# Patient Record
Sex: Female | Born: 1950 | Race: White | Hispanic: No | Smoking: Never smoker
Health system: Southern US, Community
[De-identification: ages and names within clinical notes are randomized; demographics above are authoritative.]

## PROBLEM LIST (undated history)

## (undated) DIAGNOSIS — M1990 Unspecified osteoarthritis, unspecified site: Secondary | ICD-10-CM

## (undated) DIAGNOSIS — G4700 Insomnia, unspecified: Secondary | ICD-10-CM

## (undated) DIAGNOSIS — F419 Anxiety disorder, unspecified: Secondary | ICD-10-CM

## (undated) DIAGNOSIS — E785 Hyperlipidemia, unspecified: Secondary | ICD-10-CM

## (undated) DIAGNOSIS — E039 Hypothyroidism, unspecified: Secondary | ICD-10-CM

## (undated) DIAGNOSIS — I10 Essential (primary) hypertension: Secondary | ICD-10-CM

## (undated) DIAGNOSIS — D126 Benign neoplasm of colon, unspecified: Secondary | ICD-10-CM

## (undated) DIAGNOSIS — D3A019 Benign carcinoid tumor of the small intestine, unspecified portion: Secondary | ICD-10-CM

## (undated) HISTORY — DX: Essential (primary) hypertension: I10

## (undated) HISTORY — DX: Benign carcinoid tumor of the small intestine, unspecified portion: D3A.019

## (undated) HISTORY — PX: TONSILLECTOMY: SUR1361

## (undated) HISTORY — PX: APPENDECTOMY: SHX54

## (undated) HISTORY — PX: OTHER SURGICAL HISTORY: SHX169

## (undated) HISTORY — PX: ABDOMINAL HYSTERECTOMY: SHX81

## (undated) HISTORY — PX: COLON SURGERY: SHX602

## (undated) HISTORY — DX: Benign neoplasm of colon, unspecified: D12.6

## (undated) HISTORY — DX: Unspecified osteoarthritis, unspecified site: M19.90

## (undated) HISTORY — DX: Hyperlipidemia, unspecified: E78.5

## (undated) HISTORY — DX: Insomnia, unspecified: G47.00

## (undated) HISTORY — DX: Hypothyroidism, unspecified: E03.9

---

## 2002-07-19 ENCOUNTER — Encounter: Payer: Self-pay | Admitting: Internal Medicine

## 2002-07-19 ENCOUNTER — Ambulatory Visit (HOSPITAL_COMMUNITY): Admission: RE | Admit: 2002-07-19 | Discharge: 2002-07-19 | Payer: Self-pay | Admitting: Internal Medicine

## 2002-12-11 ENCOUNTER — Ambulatory Visit (HOSPITAL_COMMUNITY): Admission: RE | Admit: 2002-12-11 | Discharge: 2002-12-11 | Payer: Self-pay | Admitting: Rheumatology

## 2002-12-11 ENCOUNTER — Encounter: Payer: Self-pay | Admitting: Rheumatology

## 2003-08-14 ENCOUNTER — Ambulatory Visit (HOSPITAL_COMMUNITY): Admission: RE | Admit: 2003-08-14 | Discharge: 2003-08-14 | Payer: Self-pay | Admitting: Internal Medicine

## 2004-08-21 ENCOUNTER — Ambulatory Visit (HOSPITAL_COMMUNITY): Admission: RE | Admit: 2004-08-21 | Discharge: 2004-08-21 | Payer: Self-pay | Admitting: Internal Medicine

## 2005-06-11 ENCOUNTER — Ambulatory Visit (HOSPITAL_COMMUNITY): Admission: RE | Admit: 2005-06-11 | Discharge: 2005-06-11 | Payer: Self-pay | Admitting: Internal Medicine

## 2005-08-23 HISTORY — PX: COLONOSCOPY: SHX174

## 2005-08-27 ENCOUNTER — Ambulatory Visit (HOSPITAL_COMMUNITY): Admission: RE | Admit: 2005-08-27 | Discharge: 2005-08-27 | Payer: Self-pay | Admitting: Internal Medicine

## 2005-09-18 ENCOUNTER — Encounter (INDEPENDENT_AMBULATORY_CARE_PROVIDER_SITE_OTHER): Payer: Self-pay | Admitting: Specialist

## 2005-09-18 ENCOUNTER — Ambulatory Visit: Payer: Self-pay | Admitting: Internal Medicine

## 2005-09-18 ENCOUNTER — Ambulatory Visit (HOSPITAL_COMMUNITY): Admission: RE | Admit: 2005-09-18 | Discharge: 2005-09-18 | Payer: Self-pay | Admitting: Internal Medicine

## 2006-08-31 ENCOUNTER — Ambulatory Visit (HOSPITAL_COMMUNITY): Admission: RE | Admit: 2006-08-31 | Discharge: 2006-08-31 | Payer: Self-pay | Admitting: Internal Medicine

## 2006-09-03 ENCOUNTER — Ambulatory Visit (HOSPITAL_COMMUNITY): Admission: RE | Admit: 2006-09-03 | Discharge: 2006-09-03 | Payer: Self-pay | Admitting: Internal Medicine

## 2006-09-08 ENCOUNTER — Encounter: Admission: RE | Admit: 2006-09-08 | Discharge: 2006-09-08 | Payer: Self-pay | Admitting: Internal Medicine

## 2007-03-16 ENCOUNTER — Ambulatory Visit (HOSPITAL_COMMUNITY): Admission: RE | Admit: 2007-03-16 | Discharge: 2007-03-16 | Payer: Self-pay | Admitting: Internal Medicine

## 2008-11-08 ENCOUNTER — Ambulatory Visit (HOSPITAL_COMMUNITY): Admission: RE | Admit: 2008-11-08 | Discharge: 2008-11-08 | Payer: Self-pay | Admitting: Internal Medicine

## 2010-06-14 ENCOUNTER — Encounter: Payer: Self-pay | Admitting: Internal Medicine

## 2010-09-16 ENCOUNTER — Encounter: Payer: Self-pay | Admitting: Internal Medicine

## 2010-09-22 ENCOUNTER — Ambulatory Visit (INDEPENDENT_AMBULATORY_CARE_PROVIDER_SITE_OTHER): Payer: Managed Care, Other (non HMO) | Admitting: Gastroenterology

## 2010-09-22 ENCOUNTER — Encounter: Payer: Self-pay | Admitting: Gastroenterology

## 2010-09-22 VITALS — BP 126/82 | HR 67 | Temp 97.7°F | Ht 61.0 in | Wt 175.8 lb

## 2010-09-22 DIAGNOSIS — Z86010 Personal history of colonic polyps: Secondary | ICD-10-CM

## 2010-09-22 MED ORDER — PEG 3350-KCL-NA BICARB-NACL 420 G PO SOLR: ORAL | Status: AC

## 2010-09-22 NOTE — Progress Notes (Signed)
Primary Care Physician:  Carylon Perches, MD  Primary Gastroenterologist:  Dr. Roetta Sessions  Chief Complaint  Patient presents with  . Colon Cancer Screening    h/o adenomatous polyps    HPI:  Hailey Randolph is a 60 y.o. female here for history of adenomatous colon polyps. She is due for surveillance colonoscopy. Last colonoscopy April 2007, scattered left-sided diverticula, pedunculated polyp at 30 cm which was adenomatous. She denies any constipation, diarrhea, melena, rectal bleeding, abdominal pain, unintentional weight loss, nausea, vomiting, heartburn, dysphagia.  Recent labs, CBC, met 7, LFTs all normal. TSH 12, Synthroid dose has been adjusted.  Current Outpatient Prescriptions  Medication Sig Dispense Refill  . aspirin 81 MG tablet Take 81 mg by mouth daily.        Marland Kitchen atorvastatin (LIPITOR) 20 MG tablet Take 20 mg by mouth daily.        . fish oil-omega-3 fatty acids 1000 MG capsule Take 2 g by mouth daily.        Marland Kitchen levothyroxine (SYNTHROID, LEVOTHROID) 75 MCG tablet Take 75 mcg by mouth daily.        Marland Kitchen lisinopril (PRINIVIL,ZESTRIL) 10 MG tablet Take 10 mg by mouth daily.        . polyethylene glycol-electrolytes (TRILYTE) 420 G solution Use as directed Also buy 1 fleet enema & 4 dulcolax tablets to use as directed  4000 mL  0    Allergies as of 09/22/2010  . (No Known Allergies)    Past Medical History  Diagnosis Date  . HTN (hypertension)   . Hyperlipidemia   . Hypothyroidism   . Adenomatous colon polyp     Past Surgical History  Procedure Date  . Tonsillectomy   . Hysterectomy     complete  . Colonoscopy April 2007    Adenomatous polyp, pedunculated, at 30 cm. Scattered left-sided diverticula     Family History  Problem Relation Age of Onset  . Colon polyps Mother   . Breast cancer Mother 63  . Heart attack Father 47    deceased  . Liver disease Neg Hx     History   Social History  . Marital Status: Widowed    Spouse Name: N/A    Number of Children:  2  . Years of Education: N/A   Occupational History  . [employer]   Social History Main Topics  . Smoking status: Never Smoker   . Smokeless tobacco: Not on file  . Alcohol Use: No  . Drug Use: No  . Sexually Active: Not on file      ROS:  General: Negative for anorexia, weight loss, fever, chills, fatigue, weakness. Eyes: Negative for vision changes.  ENT: Negative for hoarseness, difficulty swallowing , nasal congestion. CV: Negative for chest pain, angina, palpitations, dyspnea on exertion, peripheral edema.  Respiratory: Negative for dyspnea at rest, dyspnea on exertion, cough, sputum, wheezing.  GI: See history of present illness. GU:  Negative for dysuria, hematuria, urinary incontinence, urinary frequency, nocturnal urination.  MS: Negative for joint pain, low back pain.  Derm: Negative for rash or itching.  Neuro: Negative for weakness, abnormal sensation, seizure, frequent headaches, memory loss, confusion.  Psych: Negative for anxiety, depression, suicidal ideation, hallucinations.  Endo: Negative for unusual weight change.  Heme: Negative for bruising or bleeding. Allergy: Negative for rash or hives.    Physical Examination:  BP 126/82  Pulse 67  Temp(Src) 97.7 F (36.5 C) (Tympanic)  Ht 5\' 1"  (1.549  m)  Wt 175 lb 12.8 oz (79.742 kg)  BMI 33.22 kg/m2   General: Well-nourished, well-developed in no acute distress.  Head: Normocephalic, atraumatic.   Eyes: Conjunctiva pink, no icterus. Mouth: Oropharyngeal mucosa moist and pink , no lesions erythema or exudate. Neck: Supple without thyromegaly, masses, or lymphadenopathy.  Lungs: Clear to auscultation bilaterally.  Heart: Regular rate and rhythm, no murmurs rubs or gallops.  Abdomen: Bowel sounds are normal, nontender, nondistended, no hepatosplenomegaly or masses, no abdominal bruits or    hernia , no rebound or guarding.   Extremities: No lower extremity edema.  Neuro: Alert and  oriented x 4 , grossly normal neurologically.  Skin: Warm and dry, no rash or jaundice.   Psych: Alert and cooperative, normal mood and affect.

## 2010-09-22 NOTE — Assessment & Plan Note (Signed)
Due for 5 year surveillance colonoscopy at this time. I have discussed the risks, alternatives, benefits with regards to but not limited to the risk of reaction to medication, bleeding, infection, perforation and the patient is agreeable to proceed. Written consent to be obtained.

## 2010-09-22 NOTE — Progress Notes (Signed)
Cc to PCP 

## 2010-09-29 ENCOUNTER — Ambulatory Visit: Payer: Self-pay | Admitting: Gastroenterology

## 2010-10-10 NOTE — Op Note (Signed)
[name], Hailey Randolph               ACCOUNT NO.:  [id number]   MEDICAL RECORD NO.:  [id number]          PATIENT TYPE:  AMB   LOCATION:  DAY                           FACILITY:  [facility]   PHYSICIAN:  R. Roetta Sessions, M.D. DATE OF BIRTH:  [birth year]   DATE OF PROCEDURE:  09/18/2005  DATE OF DISCHARGE:                                 OPERATIVE REPORT   PROCEDURE:  Colonoscopy, snare polypectomy.   INDICATIONS FOR PROCEDURE:  Patient is a 60 year old lady who has lower GI  tract symptoms, who comes for screening colonoscopy.  There is no family  history of colorectal neoplasia.  She has never had lower GI tract  __________.  This approach has been discussed with the patient at length.  Potential risks, benefits and alternatives have been reviewed and questions  answered.  She is agreeable.  Please see documentation on the medical  record.   PROCEDURE NOTE:  O2 saturation, blood pressure, pulses, and respirations  were monitored throughout the entire procedure.  Conscious sedation with  Versed 4 mg IV, Demerol 75 mg IV in divided doses.   INSTRUMENT:  Olympus video chip system.   FINDINGS:  Digital rectal exam revealed no abnormalities.   ENDOSCOPIC FINDINGS:  Prep was adequate.   RECTAL:  Examination of the rectal mucosa, including retroflexion of the  anal verge, revealed no abnormalities.   COLON:  The colonic mucosa was surveyed from the rectosigmoid junction to  the left transverse, right colon, to the area of the appendiceal orifice,  the ileocecal valve, and cecum.  These structures were well seen and  photographed for the record.  From this level, the scope was slowly  withdrawn.  All previously mentioned mucosal surfaces were again seen.  The  patient was noted to have sigmoid diverticula and a 5 mm pedunculated polyp  at 30 cm in from the anal verge.  The remainder of the colonic mucosa  appeared normal.  The polyp was removed with hot snare and was recovered  with the  scope.  The patient tolerated the procedure well and was reactive.   ENDOSCOPY IMPRESSION:  Normal rectum, scattered left-sided diverticula,  pedunculated polyp at 30 cm, resected, as described above.  The remaining  colonic mucosa appeared normal.   RECOMMENDATIONS:  1.  No aspirin or nonsteroidal medications for 10 days.  2.  Follow up on path.  3.  Further recommendations.  4.  Diverticulosis literature provided to Ms. Dingus.      Jonathon Bellows, M.D.  Electronically Signed     RMR/MEDQ  D:  09/18/2005  T:  09/18/2005  Job:  [id number]   cc:   Hailey Callander. Ouida Sills, MD  Fax: [phone]

## 2010-10-27 ENCOUNTER — Encounter: Payer: Managed Care, Other (non HMO) | Admitting: Internal Medicine

## 2010-10-27 ENCOUNTER — Ambulatory Visit (HOSPITAL_COMMUNITY)
Admission: RE | Admit: 2010-10-27 | Discharge: 2010-10-27 | Disposition: A | Discharge status: Home / Self Care | Payer: Managed Care, Other (non HMO) | Source: Ambulatory Visit | Attending: Internal Medicine | Admitting: Internal Medicine

## 2010-10-27 DIAGNOSIS — K5730 Diverticulosis of large intestine without perforation or abscess without bleeding: Secondary | ICD-10-CM | POA: Insufficient documentation

## 2010-10-27 DIAGNOSIS — Z860100 Personal history of colon polyps, unspecified: Secondary | ICD-10-CM | POA: Insufficient documentation

## 2010-10-27 DIAGNOSIS — Z7982 Long term (current) use of aspirin: Secondary | ICD-10-CM | POA: Insufficient documentation

## 2010-10-27 DIAGNOSIS — Z09 Encounter for follow-up examination after completed treatment for conditions other than malignant neoplasm: Secondary | ICD-10-CM | POA: Insufficient documentation

## 2010-10-27 DIAGNOSIS — I10 Essential (primary) hypertension: Secondary | ICD-10-CM | POA: Insufficient documentation

## 2010-10-27 DIAGNOSIS — Z1211 Encounter for screening for malignant neoplasm of colon: Secondary | ICD-10-CM

## 2010-10-27 DIAGNOSIS — Z79899 Other long term (current) drug therapy: Secondary | ICD-10-CM | POA: Insufficient documentation

## 2010-10-27 DIAGNOSIS — E785 Hyperlipidemia, unspecified: Secondary | ICD-10-CM | POA: Insufficient documentation

## 2010-12-08 NOTE — Op Note (Signed)
  Hailey Randolph, Hailey Randolph               ACCOUNT NO.:  [id number]  MEDICAL RECORD NO.:  [id number]  LOCATION:  DAYP                          FACILITY:  [facility]  PHYSICIAN:  R. Roetta Sessions, MD FACP FACGDATE OF BIRTH:  Jan 19, 1951  DATE OF PROCEDURE:  10/27/2010 DATE OF DISCHARGE:                              OPERATIVE REPORT   INDICATIONS FOR PROCEDURE:  A 60 year old lady with history of colonic adenomas, last colonoscopy 2007 at which time she had a potential adenoma in her left colon removed and she had some left-sided diverticula.  She is currently devoid of any lower GI tract symptoms. Colonoscopy is now being done as a surveillance maneuver.  Risks, benefits, limitations, alternatives, and imponderables have been reviewed today and previously, questions answered, all parties agreeable.  PROCEDURE NOTE:  O2 saturation, blood pressure, pulse, respirations monitored throughout the entirety of the procedure.  CONSCIOUS SEDATION:  Versed 6 mg IV and Demerol 100 mg IV in divided doses.  INSTRUMENT:  Pentax video chip system.  FINDINGS:  Digital rectal exam revealed no abnormalities.  Endoscopic findings:  Prep was good.  Colon:  Colonic mucosa was surveyed from the rectosigmoid junction through the left transverse right colon to the appendiceal orifice, ileocecal valve/cecum.  These structures were well seen and photographed for the record.  From this level scope was slowly and cautiously withdrawn.  All previous mentioned mucosal surfaces were again seen.  The patient was noted to have sigmoid diverticula.  The colonic mucosa appeared normal.  Scope was pulled down into the rectum where thorough examination of rectal mucosa including retroflex view of anal verge demonstrated no abnormalities.  The patient tolerated the procedure well.  Cecal withdrawal time 8 minutes.  IMPRESSION: 1. Normal rectum. 2. Sigmoid diverticulum and colonic mucosa appeared normal.  RECOMMENDATIONS: 1.  Diverticulosis literature provided to Ms. Trainer. 2. We will repeat colonoscopy in 5 years.     Jonathon Bellows, MD FACP [facility]     RMR/MEDQ  D:  10/27/2010  T:  10/27/2010  Job:  [id number]  cc:   Kingsley Callander. Ouida Sills, MD Fax: [phone]  Electronically Signed by Lorrin Goodell M.D. on 12/08/2010 08:36:47 AM

## 2011-03-04 ENCOUNTER — Other Ambulatory Visit (HOSPITAL_COMMUNITY): Payer: Self-pay | Admitting: Internal Medicine

## 2011-03-04 DIAGNOSIS — Z139 Encounter for screening, unspecified: Secondary | ICD-10-CM

## 2011-03-09 ENCOUNTER — Ambulatory Visit (HOSPITAL_COMMUNITY)
Admission: RE | Admit: 2011-03-09 | Discharge: 2011-03-09 | Disposition: A | Discharge status: Home / Self Care | Payer: Managed Care, Other (non HMO) | Source: Ambulatory Visit | Attending: Internal Medicine | Admitting: Internal Medicine

## 2011-03-09 DIAGNOSIS — Z1231 Encounter for screening mammogram for malignant neoplasm of breast: Secondary | ICD-10-CM | POA: Insufficient documentation

## 2011-03-09 DIAGNOSIS — Z139 Encounter for screening, unspecified: Secondary | ICD-10-CM

## 2012-02-26 ENCOUNTER — Other Ambulatory Visit (HOSPITAL_COMMUNITY): Payer: Self-pay | Admitting: Internal Medicine

## 2012-02-26 DIAGNOSIS — Z139 Encounter for screening, unspecified: Secondary | ICD-10-CM

## 2012-03-10 ENCOUNTER — Ambulatory Visit (HOSPITAL_COMMUNITY)
Admission: RE | Admit: 2012-03-10 | Discharge: 2012-03-10 | Disposition: A | Discharge status: Home / Self Care | Payer: Managed Care, Other (non HMO) | Source: Ambulatory Visit | Attending: Internal Medicine | Admitting: Internal Medicine

## 2012-03-10 DIAGNOSIS — Z139 Encounter for screening, unspecified: Secondary | ICD-10-CM

## 2012-03-10 DIAGNOSIS — Z1231 Encounter for screening mammogram for malignant neoplasm of breast: Secondary | ICD-10-CM | POA: Insufficient documentation

## 2012-03-16 ENCOUNTER — Other Ambulatory Visit: Payer: Self-pay | Admitting: Internal Medicine

## 2012-03-16 DIAGNOSIS — R928 Other abnormal and inconclusive findings on diagnostic imaging of breast: Secondary | ICD-10-CM

## 2012-03-30 ENCOUNTER — Ambulatory Visit (HOSPITAL_COMMUNITY)
Admission: RE | Admit: 2012-03-30 | Discharge: 2012-03-30 | Disposition: A | Discharge status: Home / Self Care | Payer: Managed Care, Other (non HMO) | Source: Ambulatory Visit | Attending: Internal Medicine | Admitting: Internal Medicine

## 2012-03-30 ENCOUNTER — Other Ambulatory Visit (HOSPITAL_COMMUNITY): Payer: Self-pay | Admitting: Internal Medicine

## 2012-03-30 DIAGNOSIS — R928 Other abnormal and inconclusive findings on diagnostic imaging of breast: Secondary | ICD-10-CM

## 2013-02-13 ENCOUNTER — Other Ambulatory Visit (HOSPITAL_COMMUNITY): Payer: Self-pay | Admitting: Internal Medicine

## 2013-02-13 DIAGNOSIS — Z09 Encounter for follow-up examination after completed treatment for conditions other than malignant neoplasm: Secondary | ICD-10-CM

## 2013-04-05 ENCOUNTER — Ambulatory Visit (HOSPITAL_COMMUNITY)
Admission: RE | Admit: 2013-04-05 | Discharge: 2013-04-05 | Disposition: A | Discharge status: Home / Self Care | Payer: Managed Care, Other (non HMO) | Source: Ambulatory Visit | Attending: Internal Medicine | Admitting: Internal Medicine

## 2013-04-05 DIAGNOSIS — N63 Unspecified lump in unspecified breast: Secondary | ICD-10-CM | POA: Insufficient documentation

## 2013-04-05 DIAGNOSIS — Z09 Encounter for follow-up examination after completed treatment for conditions other than malignant neoplasm: Secondary | ICD-10-CM

## 2013-08-29 ENCOUNTER — Other Ambulatory Visit (HOSPITAL_COMMUNITY): Payer: Self-pay | Admitting: Internal Medicine

## 2013-08-29 DIAGNOSIS — R928 Other abnormal and inconclusive findings on diagnostic imaging of breast: Secondary | ICD-10-CM

## 2013-10-04 ENCOUNTER — Ambulatory Visit (HOSPITAL_COMMUNITY)
Admission: RE | Admit: 2013-10-04 | Discharge: 2013-10-04 | Disposition: A | Discharge status: Home / Self Care | Payer: Managed Care, Other (non HMO) | Source: Ambulatory Visit | Attending: Internal Medicine | Admitting: Internal Medicine

## 2013-10-04 ENCOUNTER — Encounter (HOSPITAL_COMMUNITY): Payer: Managed Care, Other (non HMO)

## 2013-10-04 DIAGNOSIS — R928 Other abnormal and inconclusive findings on diagnostic imaging of breast: Secondary | ICD-10-CM | POA: Insufficient documentation

## 2014-03-07 ENCOUNTER — Other Ambulatory Visit (HOSPITAL_COMMUNITY): Payer: Self-pay | Admitting: Internal Medicine

## 2014-03-07 DIAGNOSIS — N63 Unspecified lump in unspecified breast: Secondary | ICD-10-CM

## 2014-04-05 ENCOUNTER — Other Ambulatory Visit (HOSPITAL_COMMUNITY): Payer: Self-pay | Admitting: Nurse Practitioner

## 2014-04-05 DIAGNOSIS — N183 Chronic kidney disease, stage 3 unspecified: Secondary | ICD-10-CM

## 2014-04-05 DIAGNOSIS — I10 Essential (primary) hypertension: Secondary | ICD-10-CM

## 2014-04-10 ENCOUNTER — Ambulatory Visit (HOSPITAL_COMMUNITY)
Admission: RE | Admit: 2014-04-10 | Discharge: 2014-04-10 | Disposition: A | Discharge status: Home / Self Care | Payer: Managed Care, Other (non HMO) | Source: Ambulatory Visit | Attending: Nurse Practitioner | Admitting: Nurse Practitioner

## 2014-04-10 ENCOUNTER — Ambulatory Visit (HOSPITAL_COMMUNITY)
Admission: RE | Admit: 2014-04-10 | Discharge: 2014-04-10 | Disposition: A | Discharge status: Home / Self Care | Payer: Managed Care, Other (non HMO) | Source: Ambulatory Visit | Attending: Internal Medicine | Admitting: Internal Medicine

## 2014-04-10 DIAGNOSIS — I10 Essential (primary) hypertension: Secondary | ICD-10-CM

## 2014-04-10 DIAGNOSIS — N63 Unspecified lump in unspecified breast: Secondary | ICD-10-CM

## 2014-04-10 DIAGNOSIS — N183 Chronic kidney disease, stage 3 unspecified: Secondary | ICD-10-CM

## 2015-03-04 ENCOUNTER — Other Ambulatory Visit (HOSPITAL_COMMUNITY): Payer: Self-pay | Admitting: Internal Medicine

## 2015-03-04 DIAGNOSIS — Z1231 Encounter for screening mammogram for malignant neoplasm of breast: Secondary | ICD-10-CM

## 2015-04-12 ENCOUNTER — Ambulatory Visit (HOSPITAL_COMMUNITY): Payer: Managed Care, Other (non HMO)

## 2015-04-15 ENCOUNTER — Other Ambulatory Visit (HOSPITAL_COMMUNITY): Payer: Self-pay | Admitting: Internal Medicine

## 2015-04-15 ENCOUNTER — Ambulatory Visit (HOSPITAL_COMMUNITY): Payer: Managed Care, Other (non HMO)

## 2015-04-15 ENCOUNTER — Ambulatory Visit (HOSPITAL_COMMUNITY)
Admission: RE | Admit: 2015-04-15 | Discharge: 2015-04-15 | Disposition: A | Discharge status: Home / Self Care | Payer: Managed Care, Other (non HMO) | Source: Ambulatory Visit | Attending: Internal Medicine | Admitting: Internal Medicine

## 2015-04-15 DIAGNOSIS — Z1231 Encounter for screening mammogram for malignant neoplasm of breast: Secondary | ICD-10-CM | POA: Insufficient documentation

## 2015-04-23 ENCOUNTER — Other Ambulatory Visit: Payer: Self-pay | Admitting: Internal Medicine

## 2015-04-23 DIAGNOSIS — R928 Other abnormal and inconclusive findings on diagnostic imaging of breast: Secondary | ICD-10-CM

## 2015-05-07 ENCOUNTER — Ambulatory Visit (HOSPITAL_COMMUNITY)
Admission: RE | Admit: 2015-05-07 | Discharge: 2015-05-07 | Disposition: A | Discharge status: Home / Self Care | Payer: Managed Care, Other (non HMO) | Source: Ambulatory Visit | Attending: Internal Medicine | Admitting: Internal Medicine

## 2015-05-07 DIAGNOSIS — N6489 Other specified disorders of breast: Secondary | ICD-10-CM | POA: Diagnosis not present

## 2015-05-07 DIAGNOSIS — R928 Other abnormal and inconclusive findings on diagnostic imaging of breast: Secondary | ICD-10-CM

## 2015-06-10 DIAGNOSIS — Z6834 Body mass index (BMI) 34.0-34.9, adult: Secondary | ICD-10-CM | POA: Diagnosis not present

## 2015-06-10 DIAGNOSIS — I10 Essential (primary) hypertension: Secondary | ICD-10-CM | POA: Diagnosis not present

## 2015-06-10 DIAGNOSIS — M1990 Unspecified osteoarthritis, unspecified site: Secondary | ICD-10-CM | POA: Diagnosis not present

## 2015-10-29 DIAGNOSIS — H40053 Ocular hypertension, bilateral: Secondary | ICD-10-CM | POA: Diagnosis not present

## 2015-11-30 DIAGNOSIS — M1990 Unspecified osteoarthritis, unspecified site: Secondary | ICD-10-CM | POA: Diagnosis not present

## 2015-11-30 DIAGNOSIS — Z79899 Other long term (current) drug therapy: Secondary | ICD-10-CM | POA: Diagnosis not present

## 2015-11-30 DIAGNOSIS — I10 Essential (primary) hypertension: Secondary | ICD-10-CM | POA: Diagnosis not present

## 2015-11-30 DIAGNOSIS — E039 Hypothyroidism, unspecified: Secondary | ICD-10-CM | POA: Diagnosis not present

## 2015-12-09 DIAGNOSIS — Z6834 Body mass index (BMI) 34.0-34.9, adult: Secondary | ICD-10-CM | POA: Diagnosis not present

## 2015-12-09 DIAGNOSIS — Z0001 Encounter for general adult medical examination with abnormal findings: Secondary | ICD-10-CM | POA: Diagnosis not present

## 2015-12-09 DIAGNOSIS — E785 Hyperlipidemia, unspecified: Secondary | ICD-10-CM | POA: Diagnosis not present

## 2015-12-09 DIAGNOSIS — I10 Essential (primary) hypertension: Secondary | ICD-10-CM | POA: Diagnosis not present

## 2015-12-24 ENCOUNTER — Telehealth: Payer: Self-pay

## 2015-12-24 NOTE — Telephone Encounter (Signed)
Pt called to speak with DS. She received a triage letter. Please [phone]

## 2015-12-25 ENCOUNTER — Telehealth: Payer: Self-pay

## 2015-12-25 NOTE — Telephone Encounter (Signed)
See separate triage.  

## 2015-12-30 NOTE — Telephone Encounter (Signed)
Ok to schedule if no current symptoms.

## 2015-12-30 NOTE — Telephone Encounter (Addendum)
Gastroenterology Pre-Procedure Review  Request Date: 01/17/2016 Requesting Physician: Dr. Willey Blade  Pt's last colonoscopy was 10/27/2010  Dr. Gala Romney said she had previously had a potential adenoma in her left colon removed in 2007 and he recommended her next one in 5 years.   PATIENT REVIEW QUESTIONS: The patient responded to the following health history questions as indicated:    1. Diabetes Melitis: no 2. Joint replacements in the past 12 months: no 3. Major health problems in the past 3 months: no 4. Has an artificial valve or MVP: no 5. Has a defibrillator: no 6. Has been advised in past to take antibiotics in advance of a procedure like teeth cleaning: no 7. Family history of colon cancer: no  8. Alcohol Use: no 9. History of sleep apnea: no     MEDICATIONS & ALLERGIES:    Patient reports the following regarding taking any blood thinners:   Plavix? no  Aspirin? YES Coumadin? no  Patient confirms/reports the following medications:  Current Outpatient Prescriptions  Medication Sig Dispense Refill  . aspirin 81 MG tablet Take 81 mg by mouth daily.      Marland Kitchen atorvastatin (LIPITOR) 20 MG tablet Take 20 mg by mouth daily.      Marland Kitchen levothyroxine (SYNTHROID, LEVOTHROID) 75 MCG tablet Take 75 mcg by mouth daily.      Marland Kitchen lisinopril (PRINIVIL,ZESTRIL) 10 MG tablet Take 10 mg by mouth daily.      . fish oil-omega-3 fatty acids 1000 MG capsule Take 2 g by mouth daily.       No current facility-administered medications for this visit.     Patient confirms/reports the following allergies:  No Known Allergies  No orders of the defined types were placed in this encounter.   AUTHORIZATION INFORMATION Primary Insurance:   ID #:   Group #:  Pre-Cert / Auth required:  Pre-Cert / Auth #:   Secondary Insurance:   ID #:   Group #:  Pre-Cert / Auth required:  Pre-Cert / Auth #:   SCHEDULE INFORMATION: Procedure has been scheduled as follows:  Date: 01/17/2016              Time: 12:45 PM   Location: [facility] Short Stay  This Gastroenterology Pre-Precedure Review Form is being routed to the following provider(s): R. Garfield Cornea, MD

## 2016-01-02 ENCOUNTER — Other Ambulatory Visit: Payer: Self-pay

## 2016-01-02 DIAGNOSIS — Z1211 Encounter for screening for malignant neoplasm of colon: Secondary | ICD-10-CM

## 2016-01-02 MED ORDER — PEG 3350-KCL-NA BICARB-NACL 420 G PO SOLR: 4000.0000 mL | ORAL | 0 refills | Status: DC

## 2016-01-02 NOTE — Telephone Encounter (Signed)
Rx was sent to the pharmacy and instructions mailed to pt.  

## 2016-01-16 ENCOUNTER — Telehealth: Payer: Self-pay

## 2016-01-16 NOTE — Telephone Encounter (Signed)
Per fax back from [place] no PA is required for the screening colonoscopy.

## 2016-01-17 ENCOUNTER — Encounter (HOSPITAL_COMMUNITY)
Admission: RE | Disposition: A | Discharge status: Home / Self Care | Payer: Self-pay | Source: Ambulatory Visit | Attending: Internal Medicine

## 2016-01-17 ENCOUNTER — Ambulatory Visit (HOSPITAL_COMMUNITY)
Admission: RE | Admit: 2016-01-17 | Discharge: 2016-01-17 | Disposition: A | Discharge status: Home / Self Care | Payer: PPO | Source: Ambulatory Visit | Attending: Internal Medicine | Admitting: Internal Medicine

## 2016-01-17 ENCOUNTER — Encounter (HOSPITAL_COMMUNITY): Payer: Self-pay | Admitting: *Deleted

## 2016-01-17 DIAGNOSIS — D120 Benign neoplasm of cecum: Secondary | ICD-10-CM | POA: Diagnosis not present

## 2016-01-17 DIAGNOSIS — Z79899 Other long term (current) drug therapy: Secondary | ICD-10-CM | POA: Diagnosis not present

## 2016-01-17 DIAGNOSIS — Z1211 Encounter for screening for malignant neoplasm of colon: Secondary | ICD-10-CM | POA: Insufficient documentation

## 2016-01-17 DIAGNOSIS — K5730 Diverticulosis of large intestine without perforation or abscess without bleeding: Secondary | ICD-10-CM | POA: Insufficient documentation

## 2016-01-17 DIAGNOSIS — E039 Hypothyroidism, unspecified: Secondary | ICD-10-CM | POA: Diagnosis not present

## 2016-01-17 DIAGNOSIS — E785 Hyperlipidemia, unspecified: Secondary | ICD-10-CM | POA: Diagnosis not present

## 2016-01-17 DIAGNOSIS — I10 Essential (primary) hypertension: Secondary | ICD-10-CM | POA: Diagnosis not present

## 2016-01-17 DIAGNOSIS — Z7982 Long term (current) use of aspirin: Secondary | ICD-10-CM | POA: Diagnosis not present

## 2016-01-17 DIAGNOSIS — Z86010 Personal history of colonic polyps: Secondary | ICD-10-CM | POA: Diagnosis not present

## 2016-01-17 HISTORY — PX: COLONOSCOPY: SHX5424

## 2016-01-17 SURGERY — COLONOSCOPY
Anesthesia: Moderate Sedation

## 2016-01-17 MED ORDER — MIDAZOLAM HCL 5 MG/5ML IJ SOLN
INTRAMUSCULAR | Status: DC | PRN
  Administered 2016-01-17: 2 mg via INTRAVENOUS
  Administered 2016-01-17: 1 mg via INTRAVENOUS
  Administered 2016-01-17: 2 mg via INTRAVENOUS
  Administered 2016-01-17 (×2): 1 mg via INTRAVENOUS

## 2016-01-17 MED ORDER — ONDANSETRON HCL 4 MG/2ML IJ SOLN
INTRAMUSCULAR | Status: AC
  Filled 2016-01-17: qty 2

## 2016-01-17 MED ORDER — MIDAZOLAM HCL 5 MG/5ML IJ SOLN
INTRAMUSCULAR | Status: AC
  Filled 2016-01-17: qty 10

## 2016-01-17 MED ORDER — SODIUM CHLORIDE 0.9 % IV SOLN
INTRAVENOUS | Status: DC
  Administered 2016-01-17: 1000 mL via INTRAVENOUS

## 2016-01-17 MED ORDER — ONDANSETRON HCL 4 MG/2ML IJ SOLN
INTRAMUSCULAR | Status: DC | PRN
  Administered 2016-01-17: 4 mg via INTRAVENOUS

## 2016-01-17 MED ORDER — MEPERIDINE HCL 100 MG/ML IJ SOLN
INTRAMUSCULAR | Status: AC
  Filled 2016-01-17: qty 2

## 2016-01-17 MED ORDER — MEPERIDINE HCL 100 MG/ML IJ SOLN
INTRAMUSCULAR | Status: DC | PRN
  Administered 2016-01-17: 25 mg via INTRAVENOUS
  Administered 2016-01-17: 50 mg via INTRAVENOUS

## 2016-01-17 NOTE — H&P (Signed)
@  [id number]   Primary Care Physician:  Asencion Noble, MD Primary Gastroenterologist:  Dr. Gala Romney  Pre-Procedure History & Physical: HPI:  Hailey Randolph is a 65 y.o. female is here for a surveillance colonoscopy. No bowel SX; hx of colonic Adenoma.  Past Medical History:  Diagnosis Date  . Adenomatous colon polyp   . HTN (hypertension)   . Hyperlipidemia   . Hypothyroidism     Past Surgical History:  Procedure Laterality Date  . ABDOMINAL HYSTERECTOMY    . COLONOSCOPY  April 2007   Adenomatous polyp, pedunculated, at 30 cm. Scattered left-sided diverticula  . hysterectomy     complete  . TONSILLECTOMY      Prior to Admission medications   Medication Sig Start Date End Date Taking? Authorizing Provider  aspirin 81 MG tablet Take 81 mg by mouth daily.     Yes Historical Provider, MD  atorvastatin (LIPITOR) 20 MG tablet Take 20 mg by mouth daily.     Yes Historical Provider, MD  fish oil-omega-3 fatty acids 1000 MG capsule Take 2 g by mouth daily.     Yes Historical Provider, MD  levothyroxine (SYNTHROID, LEVOTHROID) 75 MCG tablet Take 75 mcg by mouth daily.     Yes Historical Provider, MD  losartan (COZAAR) 50 MG tablet Take 50 mg by mouth 2 (two) times daily.   Yes Historical Provider, MD  polyethylene glycol-electrolytes (TRILYTE) 420 g solution Take 4,000 mLs by mouth as directed. 01/02/16  Yes Daneil Dolin, MD  lisinopril (PRINIVIL,ZESTRIL) 10 MG tablet Take 10 mg by mouth daily.      Historical Provider, MD    Allergies as of 01/02/2016  . (No Known Allergies)    Family History  Problem Relation Age of Onset  . Colon polyps Mother   . Breast cancer Mother 28  . Heart attack Father 46    deceased  . Liver disease Neg Hx     Social History   Social History  . Marital status: Widowed    Spouse name: N/A  . Number of children: 2  . Years of education: N/A   Occupational History  . [employer]   Social History Main Topics  . Smoking status:  Never Smoker  . Smokeless tobacco: Never Used  . Alcohol use No  . Drug use: No  . Sexual activity: Not on file   Other Topics Concern  . Not on file   Social History Narrative  . No narrative on file    Review of Systems: See HPI, otherwise negative ROS  Physical Exam: There were no vitals taken for this visit. General:   Alert,  Well-developed, well-nourished, pleasant and cooperative in NAD Head:  Normocephalic and atraumatic. Lungs:  Clear throughout to auscultation.   No wheezes, crackles, or rhonchi. No acute distress. Heart:  Regular rate and rhythm; no murmurs, clicks, rubs,  or gallops. Abdomen:  Soft, nontender and nondistended. No masses, hepatosplenomegaly or hernias noted. Normal bowel sounds, without guarding, and without rebound.      Impression/Plan: Hailey Randolph is now here to undergo a surveillance colonoscopy.     Risks, benefits, limitations, imponderables and alternatives regarding colonoscopy have been reviewed with the patient. Questions have been answered. All parties agreeable.     Notice:  This dictation was prepared with Dragon dictation along with smaller phrase technology. Any transcriptional errors that result from this process are unintentional and may not be corrected upon review.

## 2016-01-17 NOTE — Op Note (Signed)
[facility] Patient Name: Hailey Randolph Procedure Date: 01/17/2016 1:58 PM MRN: [id number] Date of Birth: May 16, 1951 Attending MD: Norvel Richards , MD CSN: [id number] Age: 65 Admit Type: Outpatient Procedure:                Colonoscopy with snare polypectomy Indications:              High risk colon cancer surveillance: Personal                            history of colonic polyps Providers:                Norvel Richards, MD, Janeece Riggers, RN, Shelby Mattocks, Technician Referring MD:              Medicines:                Midazolam 7 mg IV, Meperidine 75 mg IV, Ondansetron                            4 mg IV Complications:            No immediate complications. Estimated Blood Loss:     Estimated blood loss was minimal. Procedure:                Pre-Anesthesia Assessment:                           - Prior to the procedure, a History and Physical                            was performed, and patient medications and                            allergies were reviewed. The patient's tolerance of                            previous anesthesia was also reviewed. The risks                            and benefits of the procedure and the sedation                            options and risks were discussed with the patient.                            All questions were answered, and informed consent                            was obtained. Prior Anticoagulants: The patient has                            taken no previous anticoagulant or antiplatelet  agents. ASA Grade Assessment: II - A patient with                            mild systemic disease. After reviewing the risks                            and benefits, the patient was deemed in                            satisfactory condition to undergo the procedure.                           After obtaining informed consent, the colonoscope                            was  passed under direct vision. Throughout the                            procedure, the patient's blood pressure, pulse, and                            oxygen saturations were monitored continuously. The                            [id number] scope was introduced through                            the anus and advanced to the the cecum, identified                            by appendiceal orifice and ileocecal valve. The                            ileocecal valve, appendiceal orifice, and rectum                            were photographed. The colonoscopy was performed                            without difficulty. The patient tolerated the                            procedure well. The quality of the bowel                            preparation was adequate. Scope In: 2:08:23 PM Scope Out: 2:23:55 PM Total Procedure Duration: 0 hours 15 minutes 32 seconds  Findings:      The perianal and digital rectal examinations were normal.      A 5 mm polyp was found in the cecum. The polyp was sessile. The polyp       was removed with a cold snare. Resection and retrieval were complete.       Estimated blood loss was minimal.      Scattered small and large-mouthed diverticula  were found in the sigmoid       colon. The remainder of the colonic mucosa appeared normal. Impression:               - One 5 mm polyp in the cecum, removed with a cold                            snare. Resected and retrieved.                           - Diverticulosis in the sigmoid colon. Moderate Sedation:      Moderate (conscious) sedation was administered by the endoscopy nurse       and supervised by the endoscopist. The following parameters were       monitored: oxygen saturation, heart rate, blood pressure, respiratory       rate, EKG, adequacy of pulmonary ventilation, and response to care.       Total physician intraservice time was 23 minutes. Recommendation:           - Patient has a contact number  available for                            emergencies. The signs and symptoms of potential                            delayed complications were discussed with the                            patient. Return to normal activities tomorrow.                            Written discharge instructions were provided to the                            patient.                           - Advance diet as tolerated.                           - Continue present medications.                           - Repeat colonoscopy date to be determined after                            pending pathology results are reviewed for                            surveillance based on pathology results.                           - Return to GI office PRN. Procedure Code(s):        --- Professional ---                           [phone], Colonoscopy, flexible; with  removal of                            tumor(s), polyp(s), or other lesion(s) by snare                            technique                           [id number], Moderate sedation services provided by the                            same physician or other qualified health care                            professional performing the diagnostic or                            therapeutic service that the sedation supports,                            requiring the presence of an independent trained                            observer to assist in the monitoring of the                            patient's level of consciousness and physiological                            status; initial 15 minutes of intraservice time,                            patient age 24 years or older                           [phone], Moderate sedation services; each additional                            15 minutes intraservice time Diagnosis Code(s):        --- Professional ---                           Z86.010, Personal history of colonic polyps                           D12.0, Benign neoplasm of  cecum                           K57.30, Diverticulosis of large intestine without                            perforation or abscess without bleeding CPT copyright 2016 [employer]. All rights reserved. The codes documented in this report are preliminary and upon coder review may  be revised to meet current compliance requirements. Cristopher Estimable.  Hiawatha Dressel, MD Norvel Richards, MD 01/17/2016 2:32:08 PM This report has been signed electronically. Number of Addenda: 0

## 2016-01-17 NOTE — Discharge Instructions (Addendum)
°Colonoscopy °Discharge Instructions ° °Read the instructions outlined below and refer to this sheet in the next few weeks. These discharge instructions provide you with general information on caring for yourself after you leave the hospital. Your doctor may also give you specific instructions. While your treatment has been planned according to the most current medical practices available, unavoidable complications occasionally occur. If you have any problems or questions after discharge, call Dr. Rourk at [phone]. °ACTIVITY °· You may resume your regular activity, but move at a slower pace for the next 24 hours.  °· Take frequent rest periods for the next 24 hours.  °· Walking will help get rid of the air and reduce the bloated feeling in your belly (abdomen).  °· No driving for 24 hours (because of the medicine (anesthesia) used during the test).   °· Do not sign any important legal documents or operate any machinery for 24 hours (because of the anesthesia used during the test).  °NUTRITION °· Drink plenty of fluids.  °· You may resume your normal diet as instructed by your doctor.  °· Begin with a light meal and progress to your normal diet. Heavy or fried foods are harder to digest and may make you feel sick to your stomach (nauseated).  °· Avoid alcoholic beverages for 24 hours or as instructed.  °MEDICATIONS °· You may resume your normal medications unless your doctor tells you otherwise.  °WHAT YOU CAN EXPECT TODAY °· Some feelings of bloating in the abdomen.  °· Passage of more gas than usual.  °· Spotting of blood in your stool or on the toilet paper.  °IF YOU HAD POLYPS REMOVED DURING THE COLONOSCOPY: °· No aspirin products for 7 days or as instructed.  °· No alcohol for 7 days or as instructed.  °· Eat a soft diet for the next 24 hours.  °FINDING OUT THE RESULTS OF YOUR TEST °Not all test results are available during your visit. If your test results are not back during the visit, make an appointment  with your caregiver to find out the results. Do not assume everything is normal if you have not heard from your caregiver or the medical facility. It is important for you to follow up on all of your test results.  °SEEK IMMEDIATE MEDICAL ATTENTION IF: °· You have more than a spotting of blood in your stool.  °· Your belly is swollen (abdominal distention).  °· You are nauseated or vomiting.  °· You have a temperature over 101.  °· You have abdominal pain or discomfort that is severe or gets worse throughout the day.  ° ° ° °Colon polyp and diverticulosis information provided ° °Further recommendations to follow pending review of pathology report ° ° ° ° ° °                                                                                                                     Colon Polyps °Polyps are lumps of extra tissue growing inside the   body. Polyps can grow in the large intestine (colon). Most colon polyps are noncancerous (benign). However, some colon polyps can become cancerous over time. Polyps that are larger than a pea may be harmful. To be safe, caregivers remove and test all polyps. °CAUSES  °Polyps form when mutations in the genes cause your cells to grow and divide even though no more tissue is needed. °RISK FACTORS °There are a number of risk factors that can increase your chances of getting colon polyps. They include: °· Being older than 50 years. °· Family history of colon polyps or colon cancer. °· Long-term colon diseases, such as colitis or Crohn disease. °· Being overweight. °· Smoking. °· Being inactive. °· Drinking too much alcohol. °SYMPTOMS  °Most small polyps do not cause symptoms. If symptoms are present, they may include: °· Blood in the stool. The stool may look dark red or black. °· Constipation or diarrhea that lasts longer than 1 week. °DIAGNOSIS °People often do not know they have polyps until their caregiver finds them during a regular checkup. Your caregiver can use 4 tests to check for  polyps: °· Digital rectal exam. The caregiver wears gloves and feels inside the rectum. This test would find polyps only in the rectum. °· Barium enema. The caregiver puts a liquid called barium into your rectum before taking X-rays of your colon. Barium makes your colon look white. Polyps are dark, so they are easy to see in the X-ray pictures. °· Sigmoidoscopy. A thin, flexible tube (sigmoidoscope) is placed into your rectum. The sigmoidoscope has a light and tiny camera in it. The caregiver uses the sigmoidoscope to look at the last third of your colon. °· Colonoscopy. This test is like sigmoidoscopy, but the caregiver looks at the entire colon. This is the most common method for finding and removing polyps. °TREATMENT  °Any polyps will be removed during a sigmoidoscopy or colonoscopy. The polyps are then tested for cancer. °PREVENTION  °To help lower your risk of getting more colon polyps: °· Eat plenty of fruits and vegetables. Avoid eating fatty foods. °· Do not smoke. °· Avoid drinking alcohol. °· Exercise every day. °· Lose weight if recommended by your caregiver. °· Eat plenty of calcium and folate. Foods that are rich in calcium include milk, cheese, and broccoli. Foods that are rich in folate include chickpeas, kidney beans, and spinach. °HOME CARE INSTRUCTIONS °Keep all follow-up appointments as directed by your caregiver. You may need periodic exams to check for polyps. °SEEK MEDICAL CARE IF: °You notice bleeding during a bowel movement. °  °This information is not intended to replace advice given to you by your health care provider. Make sure you discuss any questions you have with your health care provider. °  °Document Released: 02/05/2004 Document Revised: 06/01/2014 Document Reviewed: 07/21/2011 °Elsevier Interactive Patient Education ©2016 Else[employer] Inc[employer] ° ° ° ° ° ° ° °Diverticulosis °Diverticulosis is the condition that develops when small pouches (diverticula) form in the wall of your colon. Your  colon, or large intestine, is where water is absorbed and stool is formed. The pouches form when the inside layer of your colon pushes through weak spots in the outer layers of your colon. °CAUSES  °No one knows exactly what causes diverticulosis. °RISK FACTORS °· Being older than 50. Your risk for this condition increases with age. Diverticulosis is rare in people younger than 40 years. By age 80, almost everyone has it. °· Eating a low-fiber diet. °· Being frequently constipated. °· Being overweight. °·   Not getting enough exercise. °· Smoking. °· Taking over-the-counter pain medicines, like aspirin and ibuprofen. °SYMPTOMS  °Most people with diverticulosis do not have symptoms. °DIAGNOSIS  °Because diverticulosis often has no symptoms, health care providers often discover the condition during an exam for other colon problems. In many cases, a health care provider will diagnose diverticulosis while using a flexible scope to examine the colon (colonoscopy). °TREATMENT  °If you have never developed an infection related to diverticulosis, you may not need treatment. If you have had an infection before, treatment may include: °· Eating more fruits, vegetables, and grains. °· Taking a fiber supplement. °· Taking a live bacteria supplement (probiotic). °· Taking medicine to relax your colon. °HOME CARE INSTRUCTIONS  °· Drink at least 6-8 glasses of water each day to prevent constipation. °· Try not to strain when you have a bowel movement. °· Keep all follow-up appointments. °If you have had an infection before:  °· Increase the fiber in your diet as directed by your health care provider or dietitian. °· Take a dietary fiber supplement if your health care provider approves. °· Only take medicines as directed by your health care provider. °SEEK MEDICAL CARE IF:  °· You have abdominal pain. °· You have bloating. °· You have cramps. °· You have not gone to the bathroom in 3 days. °SEEK IMMEDIATE MEDICAL CARE IF:  °· Your  pain gets worse. °· Your bloating becomes very bad. °· You have a fever or chills, and your symptoms suddenly get worse. °· You begin vomiting. °· You have bowel movements that are bloody or black. °MAKE SURE YOU: °· Understand these instructions. °· Will watch your condition. °· Will get help right away if you are not doing well or get worse. °  °This information is not intended to replace advice given to you by your health care provider. Make sure you discuss any questions you have with your health care provider. °  °Document Released: 02/06/2004 Document Revised: 05/16/2013 Document Reviewed: 04/05/2013 °Elsevier Interactive Patient Education ©2016 Elsevier Inc. ° ° °

## 2016-01-22 ENCOUNTER — Encounter: Payer: Self-pay | Admitting: Internal Medicine

## 2016-01-23 ENCOUNTER — Encounter (HOSPITAL_COMMUNITY): Payer: Self-pay | Admitting: Internal Medicine

## 2016-06-12 DIAGNOSIS — J019 Acute sinusitis, unspecified: Secondary | ICD-10-CM | POA: Diagnosis not present

## 2016-06-12 DIAGNOSIS — Z6834 Body mass index (BMI) 34.0-34.9, adult: Secondary | ICD-10-CM | POA: Diagnosis not present

## 2016-06-19 DIAGNOSIS — G4700 Insomnia, unspecified: Secondary | ICD-10-CM | POA: Diagnosis not present

## 2016-06-19 DIAGNOSIS — I10 Essential (primary) hypertension: Secondary | ICD-10-CM | POA: Diagnosis not present

## 2016-06-19 DIAGNOSIS — Z6835 Body mass index (BMI) 35.0-35.9, adult: Secondary | ICD-10-CM | POA: Diagnosis not present

## 2016-07-17 ENCOUNTER — Other Ambulatory Visit (HOSPITAL_COMMUNITY): Payer: Self-pay | Admitting: Internal Medicine

## 2016-07-17 ENCOUNTER — Ambulatory Visit (HOSPITAL_COMMUNITY)
Admission: RE | Admit: 2016-07-17 | Discharge: 2016-07-17 | Disposition: A | Discharge status: Home / Self Care | Payer: PPO | Source: Ambulatory Visit | Attending: Internal Medicine | Admitting: Internal Medicine

## 2016-07-17 DIAGNOSIS — K810 Acute cholecystitis: Secondary | ICD-10-CM | POA: Insufficient documentation

## 2016-07-17 DIAGNOSIS — R1011 Right upper quadrant pain: Secondary | ICD-10-CM | POA: Diagnosis not present

## 2016-07-22 ENCOUNTER — Other Ambulatory Visit (HOSPITAL_COMMUNITY): Payer: Self-pay | Admitting: Internal Medicine

## 2016-07-22 DIAGNOSIS — R1011 Right upper quadrant pain: Secondary | ICD-10-CM | POA: Diagnosis not present

## 2016-07-22 DIAGNOSIS — R112 Nausea with vomiting, unspecified: Secondary | ICD-10-CM

## 2016-07-24 ENCOUNTER — Encounter (HOSPITAL_COMMUNITY): Payer: PPO

## 2016-07-24 ENCOUNTER — Encounter (HOSPITAL_COMMUNITY): Payer: Self-pay

## 2016-07-27 ENCOUNTER — Encounter (HOSPITAL_COMMUNITY): Payer: Self-pay

## 2016-07-27 ENCOUNTER — Encounter (HOSPITAL_COMMUNITY)
Admission: RE | Admit: 2016-07-27 | Discharge: 2016-07-27 | Disposition: A | Discharge status: Home / Self Care | Payer: PPO | Source: Ambulatory Visit | Attending: Internal Medicine | Admitting: Internal Medicine

## 2016-07-27 DIAGNOSIS — R112 Nausea with vomiting, unspecified: Secondary | ICD-10-CM | POA: Diagnosis not present

## 2016-07-27 DIAGNOSIS — R1011 Right upper quadrant pain: Secondary | ICD-10-CM | POA: Diagnosis not present

## 2016-07-27 DIAGNOSIS — R109 Unspecified abdominal pain: Secondary | ICD-10-CM | POA: Diagnosis not present

## 2016-07-27 MED ORDER — TECHNETIUM TC 99M MEBROFENIN IV KIT
5.0000 | PACK | Freq: Once | INTRAVENOUS | Status: AC | PRN
  Administered 2016-07-27: 5.2 via INTRAVENOUS

## 2016-08-05 ENCOUNTER — Encounter: Payer: Self-pay | Admitting: Gastroenterology

## 2016-08-05 ENCOUNTER — Ambulatory Visit (INDEPENDENT_AMBULATORY_CARE_PROVIDER_SITE_OTHER): Payer: PPO | Admitting: Gastroenterology

## 2016-08-05 DIAGNOSIS — R634 Abnormal weight loss: Secondary | ICD-10-CM | POA: Insufficient documentation

## 2016-08-05 DIAGNOSIS — R112 Nausea with vomiting, unspecified: Secondary | ICD-10-CM | POA: Diagnosis not present

## 2016-08-05 DIAGNOSIS — R1013 Epigastric pain: Secondary | ICD-10-CM

## 2016-08-05 MED ORDER — PANTOPRAZOLE SODIUM 40 MG PO TBEC: 40.0000 mg | DELAYED_RELEASE_TABLET | Freq: Every day | ORAL | 3 refills | Status: DC

## 2016-08-05 NOTE — Progress Notes (Signed)
Primary Care Physician: Asencion Noble, MD  Primary Gastroenterologist:  Garfield Cornea, MD   Chief Complaint  Patient presents with  . Abdominal Pain    x4 wks, across upper abd, occurs after eating; had [place] and HIDA  . Nausea  . Weight Loss    has lost approx 11 lbs in 4 wks    HPI: Hailey Randolph is a 66 y.o. female here For further evaluation of postprandial upper abdominal pain associated with nausea vomiting, 11 pound weight loss. Symptoms began about 4 weeks ago. Initially noted after eating steak. Started feeling symptoms develop while eating and therefore stopped. Resulted in vomiting. The following day she consumed the remainder of her steak and had recurrent upper abdominal pain and vomiting. Initially at that time she also had some gurgling sensation in the lower abdomen but never developed diarrhea. Bowel function has been normal. No blood in the stool or melena. Since the initial episode, she continues to have postprandial abdominal pain, worse with red meat or fatty foods but now happening with mashed potatoes, chicken. Typically develops upper abdominal pain within 2 hours of meals, lasting 4-6 hours. Associated with vomiting. Feels knifelike. She is afraid to eat.  No heartburn. She's had some belching. Tried over-the-counter antacids without relief. Denies NSAID or aspirin use other than aspirin 81 mg daily.  Workup thus far includes unremarkable abdominal ultrasound and HIDA scan. Patient reports her labs are unremarkable as well but I do not have a copy     Current Outpatient Prescriptions  Medication Sig Dispense Refill  . aspirin 81 MG tablet Take 81 mg by mouth daily.      Marland Kitchen atorvastatin (LIPITOR) 20 MG tablet Take 20 mg by mouth daily.      . hydrochlorothiazide (MICROZIDE) 12.5 MG capsule Take 12.5 mg by mouth daily.    Marland Kitchen levothyroxine (SYNTHROID, LEVOTHROID) 75 MCG tablet Take 75 mcg by mouth daily.      Marland Kitchen LORazepam (ATIVAN) 1 MG tablet Take 1 mg by mouth as  needed for anxiety.    Marland Kitchen losartan (COZAAR) 50 MG tablet Take 50 mg by mouth 2 (two) times daily.    . traMADol (ULTRAM) 50 MG tablet Take 50 mg by mouth as needed.    . zolpidem (AMBIEN) 10 MG tablet Take 10 mg by mouth at bedtime as needed for sleep.    . pantoprazole (PROTONIX) 40 MG tablet Take 1 tablet (40 mg total) by mouth daily before breakfast. 30 tablet 3   No current facility-administered medications for this visit.     Allergies as of 08/05/2016  . (No Known Allergies)   Past Medical History:  Diagnosis Date  . Adenomatous colon polyp   . HTN (hypertension)   . Hyperlipidemia   . Hypothyroidism    Past Surgical History:  Procedure Laterality Date  . ABDOMINAL HYSTERECTOMY    . COLONOSCOPY  April 2007   Adenomatous polyp, pedunculated, at 30 cm. Scattered left-sided diverticula  . COLONOSCOPY N/A 01/17/2016   Dr. Gala Romney: 5 mm polyp in the cecum, tubular adenoma. Scattered small and large mouth diverticula in the sigmoid colon. Next colonoscopy 5 years.  . hysterectomy     complete  . TONSILLECTOMY     Family History  Problem Relation Age of Onset  . Colon polyps Mother   . Breast cancer Mother 66  . Heart attack Father 67    deceased  . Liver disease Neg Hx    Social History  Social History  . Marital status: Widowed    Spouse name: N/A  . Number of children: 2  . Years of education: N/A   Occupational History  . [employer]   Social History Main Topics  . Smoking status: Never Smoker  . Smokeless tobacco: Never Used  . Alcohol use No  . Drug use: No  . Sexual activity: Not Asked   Other Topics Concern  . None   Social History Narrative  . None    ROS:  General: Negative for   fever, chills, fatigue, weakness. See hpi. ENT: Negative for hoarseness, difficulty swallowing , nasal congestion. CV: Negative for chest pain, angina, palpitations, dyspnea on exertion, peripheral edema.  Respiratory: Negative for dyspnea at  rest, dyspnea on exertion, cough, sputum, wheezing.  GI: See history of present illness. GU:  Negative for dysuria, hematuria, urinary incontinence, urinary frequency, nocturnal urination.  Endo: see hpi   Physical Examination:   BP (!) 141/92   Pulse 77   Temp 97.7 F (36.5 C) (Other (Comment))   Ht 5\' 2"  (1.575 m)   Wt 177 lb 3.2 oz (80.4 kg)   BMI 32.41 kg/m   General: Well-nourished, well-developed in no acute distress.  Eyes: No icterus. Mouth: Oropharyngeal mucosa moist and pink , no lesions erythema or exudate. Lungs: Clear to auscultation bilaterally.  Heart: Regular rate and rhythm, no murmurs rubs or gallops.  Abdomen: Bowel sounds are normal, moderate epigastric/ruq tenderness, nondistended, no hepatosplenomegaly or masses, no abdominal bruits or hernia , no rebound or guarding.   Extremities: No lower extremity edema. No clubbing or deformities. Neuro: Alert and oriented x 4   Skin: Warm and dry, no jaundice.   Psych: Alert and cooperative, normal mood and affect.   Imaging Studies: Nm Hepato W/eject Fract  Result Date: 07/27/2016 CLINICAL DATA:  Abdominal pain with nausea and vomiting EXAM: NUCLEAR MEDICINE HEPATOBILIARY IMAGING WITH GALLBLADDER EF TECHNIQUE: Sequential images of the abdomen were obtained out to 60 minutes following intravenous administration of radiopharmaceutical. After oral ingestion of Ensure, gallbladder ejection fraction was determined. At 60 min, normal ejection fraction is greater than 33%. RADIOPHARMACEUTICALS:  5.2 mCi Tc-7m  Choletec IV COMPARISON:  Ultrasound 07/17/2016 FINDINGS: Prompt uptake and biliary excretion of activity by the liver is seen. Gallbladder activity is visualized, consistent with patency of cystic duct. Biliary activity passes into small bowel, consistent with patent common bile duct. Calculated gallbladder ejection fraction is 71%. (Normal gallbladder ejection fraction with Ensure is greater than 33%.) IMPRESSION: Negative  study.  Gallbladder EF of 71% Electronically Signed   By: Donavan Foil M.D.   On: 07/27/2016 18:55   US Abdomen Limited Ruq  Result Date: 07/17/2016 CLINICAL DATA:  Right upper quadrant pain for 1 week. EXAM: US ABDOMEN LIMITED - RIGHT UPPER QUADRANT COMPARISON:  None. FINDINGS: Gallbladder: No gallstones or wall thickening visualized. No sonographic Murphy sign noted by sonographer. Common bile duct: Diameter: 0.3 cm Liver: No focal lesion identified. Within normal limits in parenchymal echogenicity. Simple cyst upper pole right kidney measuring 1.5 cm in diameter is incidentally noted. IMPRESSION: Negative for gallstones.  No acute abnormality. Electronically Signed   By: Inge Rise M.D.   On: 07/17/2016 14:31

## 2016-08-05 NOTE — Patient Instructions (Addendum)
1. Pantoprazole 40mg  daily on empty stomach before breakfast. RX sent to pharmacy. Please call if too expensive, I could not tell if it is on your formulary today. 2. Upper endoscopy as scheduled. See separate instructions.    Bland Diet A bland diet consists of foods that do not have a lot of fat or fiber. Foods without fat or fiber are easier for the body to digest. They are also less likely to irritate your mouth, throat, stomach, and other parts of your gastrointestinal tract. A bland diet is sometimes called a BRAT diet. What is my plan? Your health care provider or dietitian may recommend specific changes to your diet to prevent and treat your symptoms, such as:  Eating small meals often.  Cooking food until it is soft enough to chew easily.  Chewing your food well.  Drinking fluids slowly.  Not eating foods that are very spicy, sour, or fatty.  Not eating citrus fruits, such as oranges and grapefruit. What do I need to know about this diet?  Eat a variety of foods from the bland diet food list.  Do not follow a bland diet longer than you have to.  Ask your health care provider whether you should take vitamins. What foods can I eat? Grains   Hot cereals, such as cream of wheat. Bread, crackers, or tortillas made from refined white flour. Rice. Vegetables  Canned or cooked vegetables. Mashed or boiled potatoes. Fruits  Bananas. Applesauce. Other types of cooked or canned fruit with the skin and seeds removed, such as canned peaches or pears. Meats and Other Protein Sources  Scrambled eggs. Creamy peanut butter or other nut butters. Lean, well-cooked meats, such as chicken or fish. Tofu. Soups or broths. Dairy  Low-fat dairy products, such as milk, cottage cheese, or yogurt. Beverages  Water. Herbal tea. Apple juice. Sweets and Desserts  Pudding. Custard. Fruit gelatin. Ice cream. Fats and Oils  Mild salad dressings. Canola or olive oil. The items listed above may not  be a complete list of allowed foods or beverages. Contact your dietitian for more options.  What foods are not recommended? Foods and ingredients that are often not recommended include:  Spicy foods, such as hot sauce or salsa.  Fried foods.  Sour foods, such as pickled or fermented foods.  Raw vegetables or fruits, especially citrus or berries.  Caffeinated drinks.  Alcohol.  Strongly flavored seasonings or condiments. The items listed above may not be a complete list of foods and beverages that are not allowed. Contact your dietitian for more information.  This information is not intended to replace advice given to you by your health care provider. Make sure you discuss any questions you have with your health care provider. Document Released: 09/02/2015 Document Revised: 10/17/2015 Document Reviewed: 05/23/2014 Elsevier Interactive Patient Education  2017 [employer].

## 2016-08-05 NOTE — Assessment & Plan Note (Signed)
4 week history of postprandial upper abdominal pain associated with nausea and vomiting. Reports 11 pound weight loss this point. Initially associated with fatty meals like steak and hamburger. Now occurring with mashed potatoes and beans. Doesn't know what to eat. Severe episode yesterday. Gallbladder workup unremarkable. Symptoms sound biliary in nature. Differential also includes gastritis or peptic ulcer disease. At this point would recommend upper endoscopy for further evaluation.  I have discussed the risks, alternatives, benefits with regards to but not limited to the risk of reaction to medication, bleeding, infection, perforation and the patient is agreeable to proceed. Written consent to be obtained.  Start pantoprazole 40mg  daily before breakfast. Prescription provided. Bland diet. Handout provided. If upper endoscopy is unremarkable, she may require CT imaging of the abdomen.

## 2016-08-05 NOTE — Progress Notes (Signed)
cc'ed to pcp °

## 2016-08-07 ENCOUNTER — Telehealth: Payer: Self-pay

## 2016-08-07 ENCOUNTER — Other Ambulatory Visit: Payer: Self-pay

## 2016-08-07 DIAGNOSIS — R112 Nausea with vomiting, unspecified: Secondary | ICD-10-CM

## 2016-08-07 DIAGNOSIS — R634 Abnormal weight loss: Secondary | ICD-10-CM

## 2016-08-07 NOTE — Telephone Encounter (Signed)
I called patient to let her know that we could not do her EGD on Monday because I looked at the wrong scheduled doctor. She understood and said that is was fine.

## 2016-08-10 ENCOUNTER — Encounter (HOSPITAL_COMMUNITY): Payer: Self-pay

## 2016-08-10 ENCOUNTER — Ambulatory Visit (HOSPITAL_COMMUNITY): Admit: 2016-08-10 | Payer: PPO | Admitting: Internal Medicine

## 2016-08-10 SURGERY — EGD (ESOPHAGOGASTRODUODENOSCOPY)
Anesthesia: Moderate Sedation

## 2016-08-10 NOTE — Telephone Encounter (Signed)
Looks like she needs to be rescheduled.

## 2016-08-10 NOTE — Telephone Encounter (Signed)
Received fax from [facility]. EGD approved 08/07/16-11/05/16. Auth# [id number].

## 2016-08-11 ENCOUNTER — Other Ambulatory Visit: Payer: Self-pay

## 2016-08-11 DIAGNOSIS — R1013 Epigastric pain: Secondary | ICD-10-CM

## 2016-08-11 DIAGNOSIS — R112 Nausea with vomiting, unspecified: Secondary | ICD-10-CM

## 2016-08-11 DIAGNOSIS — R634 Abnormal weight loss: Secondary | ICD-10-CM

## 2016-08-12 NOTE — Telephone Encounter (Signed)
Pt is set up for Friday at 3:00 pm. Instructions are in the mail and she is aware.

## 2016-08-14 ENCOUNTER — Encounter (HOSPITAL_COMMUNITY): Payer: Self-pay | Admitting: *Deleted

## 2016-08-14 ENCOUNTER — Encounter (HOSPITAL_COMMUNITY)
Admission: RE | Disposition: A | Discharge status: Home / Self Care | Payer: Self-pay | Source: Ambulatory Visit | Attending: Internal Medicine

## 2016-08-14 ENCOUNTER — Ambulatory Visit (HOSPITAL_COMMUNITY)
Admission: RE | Admit: 2016-08-14 | Discharge: 2016-08-14 | Disposition: A | Discharge status: Home / Self Care | Payer: PPO | Source: Ambulatory Visit | Attending: Internal Medicine | Admitting: Internal Medicine

## 2016-08-14 DIAGNOSIS — Z79899 Other long term (current) drug therapy: Secondary | ICD-10-CM | POA: Insufficient documentation

## 2016-08-14 DIAGNOSIS — K3189 Other diseases of stomach and duodenum: Secondary | ICD-10-CM | POA: Diagnosis not present

## 2016-08-14 DIAGNOSIS — E039 Hypothyroidism, unspecified: Secondary | ICD-10-CM | POA: Insufficient documentation

## 2016-08-14 DIAGNOSIS — R1013 Epigastric pain: Secondary | ICD-10-CM

## 2016-08-14 DIAGNOSIS — I10 Essential (primary) hypertension: Secondary | ICD-10-CM | POA: Insufficient documentation

## 2016-08-14 DIAGNOSIS — R634 Abnormal weight loss: Secondary | ICD-10-CM | POA: Diagnosis not present

## 2016-08-14 DIAGNOSIS — R112 Nausea with vomiting, unspecified: Secondary | ICD-10-CM | POA: Diagnosis not present

## 2016-08-14 DIAGNOSIS — E785 Hyperlipidemia, unspecified: Secondary | ICD-10-CM | POA: Diagnosis not present

## 2016-08-14 DIAGNOSIS — Z7982 Long term (current) use of aspirin: Secondary | ICD-10-CM | POA: Insufficient documentation

## 2016-08-14 DIAGNOSIS — K2950 Unspecified chronic gastritis without bleeding: Secondary | ICD-10-CM | POA: Insufficient documentation

## 2016-08-14 HISTORY — PX: ESOPHAGOGASTRODUODENOSCOPY: SHX5428

## 2016-08-14 HISTORY — PX: BIOPSY: SHX5522

## 2016-08-14 SURGERY — EGD (ESOPHAGOGASTRODUODENOSCOPY)
Anesthesia: Moderate Sedation

## 2016-08-14 MED ORDER — LIDOCAINE VISCOUS 2 % MT SOLN
OROMUCOSAL | Status: DC
Start: 2016-08-14 — End: 2016-08-14
  Filled 2016-08-14: qty 15

## 2016-08-14 MED ORDER — ONDANSETRON HCL 4 MG/2ML IJ SOLN
INTRAMUSCULAR | Status: DC | PRN
  Administered 2016-08-14: 4 mg via INTRAVENOUS

## 2016-08-14 MED ORDER — MEPERIDINE HCL 100 MG/ML IJ SOLN
INTRAMUSCULAR | Status: AC
  Filled 2016-08-14: qty 2

## 2016-08-14 MED ORDER — ONDANSETRON HCL 4 MG/2ML IJ SOLN
INTRAMUSCULAR | Status: AC
  Filled 2016-08-14: qty 2

## 2016-08-14 MED ORDER — MIDAZOLAM HCL 5 MG/5ML IJ SOLN
INTRAMUSCULAR | Status: AC
  Filled 2016-08-14: qty 10

## 2016-08-14 MED ORDER — MIDAZOLAM HCL 5 MG/5ML IJ SOLN
INTRAMUSCULAR | Status: DC | PRN
  Administered 2016-08-14: 1 mg via INTRAVENOUS
  Administered 2016-08-14 (×3): 2 mg via INTRAVENOUS

## 2016-08-14 MED ORDER — SODIUM CHLORIDE 0.9 % IV SOLN
INTRAVENOUS | Status: DC
  Administered 2016-08-14: 1000 mL via INTRAVENOUS

## 2016-08-14 MED ORDER — LIDOCAINE VISCOUS 2 % MT SOLN
OROMUCOSAL | Status: DC | PRN
  Administered 2016-08-14: 3 mL via OROMUCOSAL

## 2016-08-14 MED ORDER — STERILE WATER FOR IRRIGATION IR SOLN
Status: DC | PRN
  Administered 2016-08-14: 14:00:00

## 2016-08-14 MED ORDER — MEPERIDINE HCL 100 MG/ML IJ SOLN
INTRAMUSCULAR | Status: DC | PRN
  Administered 2016-08-14: 25 mg via INTRAVENOUS
  Administered 2016-08-14: 50 mg via INTRAVENOUS
  Administered 2016-08-14: 25 mg via INTRAVENOUS

## 2016-08-14 NOTE — Op Note (Signed)
[facility] Patient Name: Hailey Randolph Procedure Date: 08/14/2016 1:42 PM MRN: [id number] Date of Birth: [birth year] Attending MD: Norvel Richards , MD CSN: [id number] Age: 66 Admit Type: Outpatient Procedure:                Upper GI endoscopy with gastric biopsy Indications:              Nausea with vomiting Providers:                Norvel Richards, MD, Jeanann Lewandowsky. Sharon Seller, RN,                            [employer]. [place], Merchant [employer] officer Referring MD:              Medicines:                Midazolam 7 mg IV, Meperidine 75 mg IV, Ondansetron                            4 mg IV Complications:            No immediate complications. Estimated Blood Loss:     Estimated blood loss was minimal. Procedure:                Pre-Anesthesia Assessment:                           - Prior to the procedure, a History and Physical                            was performed, and patient medications and                            allergies were reviewed. The patient's tolerance of                            previous anesthesia was also reviewed. The risks                            and benefits of the procedure and the sedation                            options and risks were discussed with the patient.                            All questions were answered, and informed consent                            was obtained. Prior Anticoagulants: The patient has                            taken no previous anticoagulant or antiplatelet                            agents. ASA Grade Assessment: II - A patient with  mild systemic disease. After reviewing the risks                            and benefits, the patient was deemed in                            satisfactory condition to undergo the procedure.                           After obtaining informed consent, the endoscope was                            passed under direct vision. Throughout the                             procedure, the patient's blood pressure, pulse, and                            oxygen saturations were monitored continuously. The                            EG-[id number] ([id number]) scope was introduced through the                            mouth, and advanced to the second part of duodenum.                            The upper GI endoscopy was accomplished without                            difficulty. The patient tolerated the procedure                            well. Scope In: 2:09:20 PM Scope Out: 2:15:04 PM Total Procedure Duration: 0 hours 5 minutes 44 seconds  Findings:      Multiple localized 3 mm erosions were found in the stomach. This was       biopsied with a cold forceps for microbiology. Estimated blood loss was       minimal.      The exam was otherwise without abnormality.      The duodenal bulb and second portion of the duodenum were normal.      The examined esophagus was normal. Impression:               - Erosive gastropathy. Biopsied.                           - The examination was otherwise normal.                           - Normal duodenal bulb and second portion of the                            duodenum. - Normal esophagus. With in-depth  interview regarding her dietary recall, she really                            gets her GI issues acutely after consuming red meat                            specifically. Not so much with other foods.                            Symptoms less likely gallbladder in origin given                            negative ultrasound and HIDA. With a prominent                            associated between red meat and her symptoms, we                            need to consider the possibility of alpha GAL                            sensitivitivity although no associated pruritus or                            history of tick exposure. Today's endoscopic                            fidings are nonspecific and likely  have nothing to                            do with her symptoms. Moderate Sedation:      Moderate (conscious) sedation was personally administered by an       anesthesia professional. The following parameters were monitored: oxygen       saturation, heart rate, blood pressure, respiratory rate, EKG, adequacy       of pulmonary ventilation, and response to care. Total physician       intraservice time was 14 minutes. Recommendation:           - Written discharge instructions were provided to                            the patient.                           - The signs and symptoms of potential delayed                            complications were discussed with the patient.                           - Patient has a contact number available for                            emergencies.                           -  Return to normal activities tomorrow.                           - Resume previous diet.                           - Continue present medications.                           - Await pathology results.                           - No repeat upper endoscopy.                           - Return to GI office (date not yet determined). Procedure Code(s):        --- Professional ---                           [phone], Esophagogastroduodenoscopy, flexible,                            transoral; with biopsy, single or multiple Diagnosis Code(s):        --- Professional ---                           K31.89, Other diseases of stomach and duodenum                           R11.2, Nausea with vomiting, unspecified CPT copyright 2016 American Medical Association. All rights reserved. The codes documented in this report are preliminary and upon coder review may  be revised to meet current compliance requirements. Cristopher Estimable. Jase Himmelberger, MD Norvel Richards, MD 08/14/2016 2:31:42 PM This report has been signed electronically. Number of Addenda: 0

## 2016-08-14 NOTE — Discharge Instructions (Addendum)
EGD Discharge instructions Please read the instructions outlined below and refer to this sheet in the next few weeks. These discharge instructions provide you with general information on caring for yourself after you leave the hospital. Your doctor may also give you specific instructions. While your treatment has been planned according to the most current medical practices available, unavoidable complications occasionally occur. If you have any problems or questions after discharge, please call your doctor. ACTIVITY  You may resume your regular activity but move at a slower pace for the next 24 hours.   Take frequent rest periods for the next 24 hours.   Walking will help expel (get rid of) the air and reduce the bloated feeling in your abdomen.   No driving for 24 hours (because of the anesthesia (medicine) used during the test).   You may shower.   Do not sign any important legal documents or operate any machinery for 24 hours (because of the anesthesia used during the test).  NUTRITION  Drink plenty of fluids.   You may resume your normal diet.   Begin with a light meal and progress to your normal diet.   Avoid alcoholic beverages for 24 hours or as instructed by your caregiver.  MEDICATIONS  You may resume your normal medications unless your caregiver tells you otherwise.  WHAT YOU CAN EXPECT TODAY  You may experience abdominal discomfort such as a feeling of fullness or gas pains.  FOLLOW-UP  Your doctor will discuss the results of your test with you.  SEEK IMMEDIATE MEDICAL ATTENTION IF ANY OF THE FOLLOWING OCCUR:  Excessive nausea (feeling sick to your stomach) and/or vomiting.   Severe abdominal pain and distention (swelling).   Trouble swallowing.   Temperature over 101 F (37.8 C).   Rectal bleeding or vomiting of blood.    Continue Protonix 40 mg   Further recommendations to follow pending review of pathology report

## 2016-08-14 NOTE — Interval H&P Note (Signed)
History and Physical Interval Note:  08/14/2016 1:51 PM  Hailey Randolph  has presented today for surgery, with the diagnosis of EPIG PAIN/WT LOSS/N/V  The various methods of treatment have been discussed with the patient and family. After consideration of risks, benefits and other options for treatment, the patient has consented to  Procedure(s) with comments: ESOPHAGOGASTRODUODENOSCOPY (EGD) (N/A) - 215 as a surgical intervention .  The patient's history has been reviewed, patient examined, no change in status, stable for surgery.  I have reviewed the patient's chart and labs.  Questions were answered to the patient's satisfaction.     Tailor Westfall  No change. No dysphagia. Diagnostic EGD per plan.  The risks, benefits, limitations, alternatives and imponderables have been reviewed with the patient. Potential for esophageal dilation, biopsy, etc. have also been reviewed.  Questions have been answered. All parties agreeable.

## 2016-08-14 NOTE — H&P (View-Only) (Signed)
Primary Care Physician: Asencion Noble, MD  Primary Gastroenterologist:  Garfield Cornea, MD   Chief Complaint  Patient presents with  . Abdominal Pain    x4 wks, across upper abd, occurs after eating; had [place] and HIDA  . Nausea  . Weight Loss    has lost approx 11 lbs in 4 wks    HPI: Hailey Randolph is a 66 y.o. female here For further evaluation of postprandial upper abdominal pain associated with nausea vomiting, 11 pound weight loss. Symptoms began about 4 weeks ago. Initially noted after eating steak. Started feeling symptoms develop while eating and therefore stopped. Resulted in vomiting. The following day she consumed the remainder of her steak and had recurrent upper abdominal pain and vomiting. Initially at that time she also had some gurgling sensation in the lower abdomen but never developed diarrhea. Bowel function has been normal. No blood in the stool or melena. Since the initial episode, she continues to have postprandial abdominal pain, worse with red meat or fatty foods but now happening with mashed potatoes, chicken. Typically develops upper abdominal pain within 2 hours of meals, lasting 4-6 hours. Associated with vomiting. Feels knifelike. She is afraid to eat.  No heartburn. She's had some belching. Tried over-the-counter antacids without relief. Denies NSAID or aspirin use other than aspirin 81 mg daily.  Workup thus far includes unremarkable abdominal ultrasound and HIDA scan. Patient reports her labs are unremarkable as well but I do not have a copy     Current Outpatient Prescriptions  Medication Sig Dispense Refill  . aspirin 81 MG tablet Take 81 mg by mouth daily.      Marland Kitchen atorvastatin (LIPITOR) 20 MG tablet Take 20 mg by mouth daily.      . hydrochlorothiazide (MICROZIDE) 12.5 MG capsule Take 12.5 mg by mouth daily.    Marland Kitchen levothyroxine (SYNTHROID, LEVOTHROID) 75 MCG tablet Take 75 mcg by mouth daily.      Marland Kitchen LORazepam (ATIVAN) 1 MG tablet Take 1 mg by mouth as  needed for anxiety.    Marland Kitchen losartan (COZAAR) 50 MG tablet Take 50 mg by mouth 2 (two) times daily.    . traMADol (ULTRAM) 50 MG tablet Take 50 mg by mouth as needed.    . zolpidem (AMBIEN) 10 MG tablet Take 10 mg by mouth at bedtime as needed for sleep.    . pantoprazole (PROTONIX) 40 MG tablet Take 1 tablet (40 mg total) by mouth daily before breakfast. 30 tablet 3   No current facility-administered medications for this visit.     Allergies as of 08/05/2016  . (No Known Allergies)   Past Medical History:  Diagnosis Date  . Adenomatous colon polyp   . HTN (hypertension)   . Hyperlipidemia   . Hypothyroidism    Past Surgical History:  Procedure Laterality Date  . ABDOMINAL HYSTERECTOMY    . COLONOSCOPY  April 2007   Adenomatous polyp, pedunculated, at 30 cm. Scattered left-sided diverticula  . COLONOSCOPY N/A 01/17/2016   Dr. Gala Romney: 5 mm polyp in the cecum, tubular adenoma. Scattered small and large mouth diverticula in the sigmoid colon. Next colonoscopy 5 years.  . hysterectomy     complete  . TONSILLECTOMY     Family History  Problem Relation Age of Onset  . Colon polyps Mother   . Breast cancer Mother 33  . Heart attack Father 32    deceased  . Liver disease Neg Hx    Social History  Social History  . Marital status: Widowed    Spouse name: N/A  . Number of children: 2  . Years of education: N/A   Occupational History  . [employer]   Social History Main Topics  . Smoking status: Never Smoker  . Smokeless tobacco: Never Used  . Alcohol use No  . Drug use: No  . Sexual activity: Not Asked   Other Topics Concern  . None   Social History Narrative  . None    ROS:  General: Negative for   fever, chills, fatigue, weakness. See hpi. ENT: Negative for hoarseness, difficulty swallowing , nasal congestion. CV: Negative for chest pain, angina, palpitations, dyspnea on exertion, peripheral edema.  Respiratory: Negative for dyspnea at  rest, dyspnea on exertion, cough, sputum, wheezing.  GI: See history of present illness. GU:  Negative for dysuria, hematuria, urinary incontinence, urinary frequency, nocturnal urination.  Endo: see hpi   Physical Examination:   BP (!) 141/92   Pulse 77   Temp 97.7 F (36.5 C) (Other (Comment))   Ht 5\' 2"  (1.575 m)   Wt 177 lb 3.2 oz (80.4 kg)   BMI 32.41 kg/m   General: Well-nourished, well-developed in no acute distress.  Eyes: No icterus. Mouth: Oropharyngeal mucosa moist and pink , no lesions erythema or exudate. Lungs: Clear to auscultation bilaterally.  Heart: Regular rate and rhythm, no murmurs rubs or gallops.  Abdomen: Bowel sounds are normal, moderate epigastric/ruq tenderness, nondistended, no hepatosplenomegaly or masses, no abdominal bruits or hernia , no rebound or guarding.   Extremities: No lower extremity edema. No clubbing or deformities. Neuro: Alert and oriented x 4   Skin: Warm and dry, no jaundice.   Psych: Alert and cooperative, normal mood and affect.   Imaging Studies: Nm Hepato W/eject Fract  Result Date: 07/27/2016 CLINICAL DATA:  Abdominal pain with nausea and vomiting EXAM: NUCLEAR MEDICINE HEPATOBILIARY IMAGING WITH GALLBLADDER EF TECHNIQUE: Sequential images of the abdomen were obtained out to 60 minutes following intravenous administration of radiopharmaceutical. After oral ingestion of Ensure, gallbladder ejection fraction was determined. At 60 min, normal ejection fraction is greater than 33%. RADIOPHARMACEUTICALS:  5.2 mCi Tc-15m  Choletec IV COMPARISON:  Ultrasound 07/17/2016 FINDINGS: Prompt uptake and biliary excretion of activity by the liver is seen. Gallbladder activity is visualized, consistent with patency of cystic duct. Biliary activity passes into small bowel, consistent with patent common bile duct. Calculated gallbladder ejection fraction is 71%. (Normal gallbladder ejection fraction with Ensure is greater than 33%.) IMPRESSION: Negative  study.  Gallbladder EF of 71% Electronically Signed   By: Donavan Foil M.D.   On: 07/27/2016 18:55   US Abdomen Limited Ruq  Result Date: 07/17/2016 CLINICAL DATA:  Right upper quadrant pain for 1 week. EXAM: US ABDOMEN LIMITED - RIGHT UPPER QUADRANT COMPARISON:  None. FINDINGS: Gallbladder: No gallstones or wall thickening visualized. No sonographic Murphy sign noted by sonographer. Common bile duct: Diameter: 0.3 cm Liver: No focal lesion identified. Within normal limits in parenchymal echogenicity. Simple cyst upper pole right kidney measuring 1.5 cm in diameter is incidentally noted. IMPRESSION: Negative for gallstones.  No acute abnormality. Electronically Signed   By: Inge Rise M.D.   On: 07/17/2016 14:31

## 2016-08-17 NOTE — Patient Instructions (Signed)
EGD approved. Outpatient Authorization# [id number]. 08/07/16-11/05/16.

## 2016-08-18 ENCOUNTER — Other Ambulatory Visit: Payer: Self-pay | Admitting: Internal Medicine

## 2016-08-18 ENCOUNTER — Encounter: Payer: Self-pay | Admitting: Internal Medicine

## 2016-08-18 ENCOUNTER — Other Ambulatory Visit: Payer: Self-pay

## 2016-08-18 DIAGNOSIS — R112 Nausea with vomiting, unspecified: Secondary | ICD-10-CM

## 2016-08-18 NOTE — Progress Notes (Signed)
Labs from 07/17/2016 reviewed White blood cell count 7700, hemoglobin 14.4, hematocrit 42.2, MCV 94, platelets 371,000, glucose 96, BUN 13, creatinine 0.9, albumin 4.2, total bilirubin 0.4, alkaline phosphatase 73, AST 16, ALT 15, amylase 27

## 2016-08-19 ENCOUNTER — Encounter (HOSPITAL_COMMUNITY): Payer: Self-pay | Admitting: Internal Medicine

## 2016-08-19 DIAGNOSIS — R1013 Epigastric pain: Secondary | ICD-10-CM | POA: Diagnosis not present

## 2016-08-19 DIAGNOSIS — R634 Abnormal weight loss: Secondary | ICD-10-CM | POA: Diagnosis not present

## 2016-08-19 DIAGNOSIS — R112 Nausea with vomiting, unspecified: Secondary | ICD-10-CM | POA: Diagnosis not present

## 2016-08-23 LAB — ALLERGEN PROFILE, FOOD-MEAT
Beef IgE: <0.1 kU/L
Pork IgE: <0.1 kU/L

## 2016-09-01 ENCOUNTER — Encounter: Payer: Self-pay | Admitting: Internal Medicine

## 2016-09-01 ENCOUNTER — Telehealth: Payer: Self-pay | Admitting: Internal Medicine

## 2016-09-01 LAB — ALPHA GAL IGE

## 2016-09-01 LAB — SPECIMEN STATUS REPORT

## 2016-09-01 NOTE — Telephone Encounter (Signed)
Noted  

## 2016-09-01 NOTE — Telephone Encounter (Signed)
Pt said that she has given up red meat and is starting to feel better. She does not want to schedule an OV at this time. She appreciates all that we have done for her and will call if she needs [place].

## 2016-09-01 NOTE — Progress Notes (Signed)
APPT MADE AND LETTER SENT  °

## 2016-09-01 NOTE — Telephone Encounter (Signed)
Communication noted.  

## 2016-09-02 ENCOUNTER — Other Ambulatory Visit (HOSPITAL_COMMUNITY): Payer: Self-pay | Admitting: Internal Medicine

## 2016-09-02 DIAGNOSIS — Z1231 Encounter for screening mammogram for malignant neoplasm of breast: Secondary | ICD-10-CM

## 2016-09-04 ENCOUNTER — Ambulatory Visit (HOSPITAL_COMMUNITY)
Admission: RE | Admit: 2016-09-04 | Discharge: 2016-09-04 | Disposition: A | Discharge status: Home / Self Care | Payer: PPO | Source: Ambulatory Visit | Attending: Internal Medicine | Admitting: Internal Medicine

## 2016-09-04 DIAGNOSIS — Z1231 Encounter for screening mammogram for malignant neoplasm of breast: Secondary | ICD-10-CM

## 2016-09-04 DIAGNOSIS — R928 Other abnormal and inconclusive findings on diagnostic imaging of breast: Secondary | ICD-10-CM | POA: Insufficient documentation

## 2016-09-07 ENCOUNTER — Other Ambulatory Visit (HOSPITAL_COMMUNITY): Payer: Self-pay | Admitting: Internal Medicine

## 2016-09-07 DIAGNOSIS — R928 Other abnormal and inconclusive findings on diagnostic imaging of breast: Secondary | ICD-10-CM

## 2016-09-08 ENCOUNTER — Other Ambulatory Visit (HOSPITAL_COMMUNITY): Payer: Self-pay | Admitting: Internal Medicine

## 2016-09-08 ENCOUNTER — Ambulatory Visit (HOSPITAL_COMMUNITY)
Admission: RE | Admit: 2016-09-08 | Discharge: 2016-09-08 | Disposition: A | Discharge status: Home / Self Care | Payer: PPO | Source: Ambulatory Visit | Attending: Internal Medicine | Admitting: Internal Medicine

## 2016-09-08 DIAGNOSIS — R928 Other abnormal and inconclusive findings on diagnostic imaging of breast: Secondary | ICD-10-CM | POA: Insufficient documentation

## 2016-09-10 ENCOUNTER — Ambulatory Visit
Admission: RE | Admit: 2016-09-10 | Discharge: 2016-09-10 | Disposition: A | Discharge status: Home / Self Care | Payer: PPO | Source: Ambulatory Visit | Attending: Internal Medicine | Admitting: Internal Medicine

## 2016-09-10 ENCOUNTER — Inpatient Hospital Stay: Admission: RE | Admit: 2016-09-10 | Payer: PPO | Source: Ambulatory Visit

## 2016-09-10 ENCOUNTER — Other Ambulatory Visit (HOSPITAL_COMMUNITY): Payer: Self-pay | Admitting: Internal Medicine

## 2016-09-10 ENCOUNTER — Other Ambulatory Visit: Payer: PPO

## 2016-09-10 DIAGNOSIS — N6489 Other specified disorders of breast: Secondary | ICD-10-CM

## 2016-10-01 ENCOUNTER — Ambulatory Visit: Payer: PPO | Admitting: Gastroenterology

## 2016-10-13 ENCOUNTER — Ambulatory Visit: Payer: PPO | Admitting: Gastroenterology

## 2016-10-15 ENCOUNTER — Other Ambulatory Visit: Payer: Self-pay | Admitting: General Surgery

## 2016-10-15 DIAGNOSIS — N6489 Other specified disorders of breast: Secondary | ICD-10-CM

## 2016-10-20 ENCOUNTER — Other Ambulatory Visit: Payer: Self-pay | Admitting: General Surgery

## 2016-10-20 DIAGNOSIS — N6489 Other specified disorders of breast: Secondary | ICD-10-CM

## 2016-10-26 ENCOUNTER — Encounter (HOSPITAL_BASED_OUTPATIENT_CLINIC_OR_DEPARTMENT_OTHER): Payer: Self-pay | Admitting: *Deleted

## 2016-10-29 ENCOUNTER — Encounter (HOSPITAL_COMMUNITY)
Admission: RE | Admit: 2016-10-29 | Discharge: 2016-10-29 | Disposition: A | Discharge status: Home / Self Care | Payer: PPO | Source: Ambulatory Visit | Attending: General Surgery | Admitting: General Surgery

## 2016-10-29 DIAGNOSIS — R9431 Abnormal electrocardiogram [ECG] [EKG]: Secondary | ICD-10-CM | POA: Insufficient documentation

## 2016-10-29 DIAGNOSIS — Z01812 Encounter for preprocedural laboratory examination: Secondary | ICD-10-CM | POA: Diagnosis not present

## 2016-10-29 DIAGNOSIS — I10 Essential (primary) hypertension: Secondary | ICD-10-CM | POA: Diagnosis not present

## 2016-10-29 LAB — BASIC METABOLIC PANEL
Anion gap: 10 (ref 5–15)
BUN: 11 mg/dL (ref 6–20)
CO2: 27 mmol/L (ref 22–32)
CREATININE: 0.89 mg/dL (ref 0.44–1.00)
Calcium: 9 mg/dL (ref 8.9–10.3)
Chloride: 104 mmol/L (ref 101–111)
GFR calc non Af Amer: >60 mL/min (ref >60)
GLUCOSE: 110 mg/dL — AB (ref 65–99)
Potassium: 3.9 mmol/L (ref 3.5–5.1)
Sodium: 141 mmol/L (ref 135–145)

## 2016-10-30 ENCOUNTER — Ambulatory Visit
Admission: RE | Admit: 2016-10-30 | Discharge: 2016-10-30 | Disposition: A | Discharge status: Home / Self Care | Payer: PPO | Source: Ambulatory Visit | Attending: General Surgery | Admitting: General Surgery

## 2016-10-30 DIAGNOSIS — N6489 Other specified disorders of breast: Secondary | ICD-10-CM | POA: Diagnosis not present

## 2016-10-30 DIAGNOSIS — R928 Other abnormal and inconclusive findings on diagnostic imaging of breast: Secondary | ICD-10-CM | POA: Diagnosis not present

## 2016-10-30 HISTORY — PX: BREAST EXCISIONAL BIOPSY: SUR124

## 2016-10-30 NOTE — Pre-Procedure Instructions (Signed)
Boost Breeze 8 oz. given to pt. - to drink by 0645 DOS; pt. voiced understanding.

## 2016-10-30 NOTE — Progress Notes (Signed)
Dr. Gifford Shave reviewed EKG - ok for surgery if pt is not having chest pain and can climb stairs. Spoke with pt on phone - no chest pain and no difficulty climbing stairs except for her knees.

## 2016-11-02 ENCOUNTER — Ambulatory Visit (HOSPITAL_BASED_OUTPATIENT_CLINIC_OR_DEPARTMENT_OTHER): Payer: PPO | Admitting: Certified Registered Nurse Anesthetist

## 2016-11-02 ENCOUNTER — Encounter (HOSPITAL_BASED_OUTPATIENT_CLINIC_OR_DEPARTMENT_OTHER)
Admission: RE | Disposition: A | Discharge status: Home / Self Care | Payer: Self-pay | Source: Ambulatory Visit | Attending: General Surgery

## 2016-11-02 ENCOUNTER — Ambulatory Visit (HOSPITAL_BASED_OUTPATIENT_CLINIC_OR_DEPARTMENT_OTHER)
Admission: RE | Admit: 2016-11-02 | Discharge: 2016-11-02 | Disposition: A | Discharge status: Home / Self Care | Payer: PPO | Source: Ambulatory Visit | Attending: General Surgery | Admitting: General Surgery

## 2016-11-02 ENCOUNTER — Ambulatory Visit
Admission: RE | Admit: 2016-11-02 | Discharge: 2016-11-02 | Disposition: A | Discharge status: Home / Self Care | Payer: PPO | Source: Ambulatory Visit | Attending: General Surgery | Admitting: General Surgery

## 2016-11-02 ENCOUNTER — Encounter (HOSPITAL_BASED_OUTPATIENT_CLINIC_OR_DEPARTMENT_OTHER): Payer: Self-pay | Admitting: Anesthesiology

## 2016-11-02 DIAGNOSIS — N6092 Unspecified benign mammary dysplasia of left breast: Secondary | ICD-10-CM | POA: Diagnosis not present

## 2016-11-02 DIAGNOSIS — N6012 Diffuse cystic mastopathy of left breast: Secondary | ICD-10-CM | POA: Insufficient documentation

## 2016-11-02 DIAGNOSIS — N6082 Other benign mammary dysplasias of left breast: Secondary | ICD-10-CM | POA: Diagnosis not present

## 2016-11-02 DIAGNOSIS — Z90710 Acquired absence of both cervix and uterus: Secondary | ICD-10-CM | POA: Diagnosis not present

## 2016-11-02 DIAGNOSIS — Z86010 Personal history of colonic polyps: Secondary | ICD-10-CM | POA: Diagnosis not present

## 2016-11-02 DIAGNOSIS — E039 Hypothyroidism, unspecified: Secondary | ICD-10-CM | POA: Diagnosis not present

## 2016-11-02 DIAGNOSIS — Z8249 Family history of ischemic heart disease and other diseases of the circulatory system: Secondary | ICD-10-CM | POA: Insufficient documentation

## 2016-11-02 DIAGNOSIS — F419 Anxiety disorder, unspecified: Secondary | ICD-10-CM | POA: Insufficient documentation

## 2016-11-02 DIAGNOSIS — I10 Essential (primary) hypertension: Secondary | ICD-10-CM | POA: Insufficient documentation

## 2016-11-02 DIAGNOSIS — Z79899 Other long term (current) drug therapy: Secondary | ICD-10-CM | POA: Diagnosis not present

## 2016-11-02 DIAGNOSIS — E669 Obesity, unspecified: Secondary | ICD-10-CM | POA: Insufficient documentation

## 2016-11-02 DIAGNOSIS — N6489 Other specified disorders of breast: Secondary | ICD-10-CM

## 2016-11-02 DIAGNOSIS — Z803 Family history of malignant neoplasm of breast: Secondary | ICD-10-CM | POA: Insufficient documentation

## 2016-11-02 DIAGNOSIS — R1013 Epigastric pain: Secondary | ICD-10-CM | POA: Diagnosis not present

## 2016-11-02 DIAGNOSIS — L905 Scar conditions and fibrosis of skin: Secondary | ICD-10-CM | POA: Diagnosis not present

## 2016-11-02 DIAGNOSIS — R112 Nausea with vomiting, unspecified: Secondary | ICD-10-CM | POA: Diagnosis not present

## 2016-11-02 DIAGNOSIS — Z6832 Body mass index (BMI) 32.0-32.9, adult: Secondary | ICD-10-CM | POA: Insufficient documentation

## 2016-11-02 DIAGNOSIS — R921 Mammographic calcification found on diagnostic imaging of breast: Secondary | ICD-10-CM | POA: Diagnosis not present

## 2016-11-02 DIAGNOSIS — Z7982 Long term (current) use of aspirin: Secondary | ICD-10-CM | POA: Insufficient documentation

## 2016-11-02 DIAGNOSIS — N63 Unspecified lump in unspecified breast: Secondary | ICD-10-CM | POA: Diagnosis not present

## 2016-11-02 DIAGNOSIS — E7800 Pure hypercholesterolemia, unspecified: Secondary | ICD-10-CM | POA: Diagnosis not present

## 2016-11-02 DIAGNOSIS — R928 Other abnormal and inconclusive findings on diagnostic imaging of breast: Secondary | ICD-10-CM | POA: Diagnosis not present

## 2016-11-02 DIAGNOSIS — R634 Abnormal weight loss: Secondary | ICD-10-CM | POA: Diagnosis not present

## 2016-11-02 HISTORY — PX: RADIOACTIVE SEED GUIDED EXCISIONAL BREAST BIOPSY: SHX6490

## 2016-11-02 HISTORY — DX: Anxiety disorder, unspecified: F41.9

## 2016-11-02 SURGERY — RADIOACTIVE SEED GUIDED BREAST BIOPSY
Anesthesia: General | Site: Breast | Laterality: Bilateral

## 2016-11-02 MED ORDER — LACTATED RINGERS IV SOLN
INTRAVENOUS | Status: DC
  Administered 2016-11-02 (×2): via INTRAVENOUS

## 2016-11-02 MED ORDER — FENTANYL CITRATE (PF) 100 MCG/2ML IJ SOLN
50.0000 ug | INTRAMUSCULAR | Status: AC | PRN
  Administered 2016-11-02 (×3): 25 ug via INTRAVENOUS

## 2016-11-02 MED ORDER — MIDAZOLAM HCL 2 MG/2ML IJ SOLN
1.0000 mg | INTRAMUSCULAR | Status: DC | PRN
  Administered 2016-11-02: 2 mg via INTRAVENOUS

## 2016-11-02 MED ORDER — ONDANSETRON HCL 4 MG/2ML IJ SOLN
INTRAMUSCULAR | Status: AC
  Filled 2016-11-02: qty 2

## 2016-11-02 MED ORDER — ACETAMINOPHEN 500 MG PO TABS
ORAL_TABLET | ORAL | Status: AC
  Filled 2016-11-02: qty 2

## 2016-11-02 MED ORDER — CEFAZOLIN SODIUM-DEXTROSE 2-4 GM/100ML-% IV SOLN
INTRAVENOUS | Status: AC
  Filled 2016-11-02: qty 100

## 2016-11-02 MED ORDER — MIDAZOLAM HCL 2 MG/2ML IJ SOLN
INTRAMUSCULAR | Status: AC
  Filled 2016-11-02: qty 2

## 2016-11-02 MED ORDER — PROPOFOL 10 MG/ML IV BOLUS
INTRAVENOUS | Status: DC | PRN
  Administered 2016-11-02: 120 mg via INTRAVENOUS

## 2016-11-02 MED ORDER — LIDOCAINE 2% (20 MG/ML) 5 ML SYRINGE
INTRAMUSCULAR | Status: DC | PRN
  Administered 2016-11-02: 80 mg via INTRAVENOUS

## 2016-11-02 MED ORDER — CHLORHEXIDINE GLUCONATE CLOTH 2 % EX PADS: 6.0000 | MEDICATED_PAD | Freq: Once | CUTANEOUS | Status: DC

## 2016-11-02 MED ORDER — FENTANYL CITRATE (PF) 100 MCG/2ML IJ SOLN
25.0000 ug | INTRAMUSCULAR | Status: DC | PRN
  Administered 2016-11-02: 25 ug via INTRAVENOUS

## 2016-11-02 MED ORDER — HYDROCODONE-ACETAMINOPHEN 7.5-325 MG PO TABS: 1.0000 | ORAL_TABLET | Freq: Once | ORAL | Status: DC | PRN

## 2016-11-02 MED ORDER — SCOPOLAMINE 1 MG/3DAYS TD PT72: 1.0000 | MEDICATED_PATCH | Freq: Once | TRANSDERMAL | Status: DC | PRN

## 2016-11-02 MED ORDER — LIDOCAINE 2% (20 MG/ML) 5 ML SYRINGE
INTRAMUSCULAR | Status: AC
  Filled 2016-11-02: qty 5

## 2016-11-02 MED ORDER — DEXAMETHASONE SODIUM PHOSPHATE 10 MG/ML IJ SOLN
INTRAMUSCULAR | Status: DC | PRN
  Administered 2016-11-02: 10 mg via INTRAVENOUS

## 2016-11-02 MED ORDER — BUPIVACAINE HCL (PF) 0.25 % IJ SOLN
INTRAMUSCULAR | Status: DC | PRN
  Administered 2016-11-02: 20 mL

## 2016-11-02 MED ORDER — CEFAZOLIN SODIUM-DEXTROSE 2-4 GM/100ML-% IV SOLN
2.0000 g | INTRAVENOUS | Status: AC
  Administered 2016-11-02: 2 g via INTRAVENOUS

## 2016-11-02 MED ORDER — DEXAMETHASONE SODIUM PHOSPHATE 10 MG/ML IJ SOLN
INTRAMUSCULAR | Status: AC
  Filled 2016-11-02: qty 1

## 2016-11-02 MED ORDER — FENTANYL CITRATE (PF) 100 MCG/2ML IJ SOLN
INTRAMUSCULAR | Status: AC
  Filled 2016-11-02: qty 2

## 2016-11-02 MED ORDER — GABAPENTIN 300 MG PO CAPS
300.0000 mg | ORAL_CAPSULE | ORAL | Status: AC
  Administered 2016-11-02: 300 mg via ORAL

## 2016-11-02 MED ORDER — MEPERIDINE HCL 25 MG/ML IJ SOLN: 6.2500 mg | INTRAMUSCULAR | Status: DC | PRN

## 2016-11-02 MED ORDER — ONDANSETRON HCL 4 MG/2ML IJ SOLN
INTRAMUSCULAR | Status: DC | PRN
  Administered 2016-11-02: 4 mg via INTRAVENOUS

## 2016-11-02 MED ORDER — PROMETHAZINE HCL 25 MG/ML IJ SOLN: 6.2500 mg | INTRAMUSCULAR | Status: DC | PRN

## 2016-11-02 MED ORDER — GABAPENTIN 300 MG PO CAPS
ORAL_CAPSULE | ORAL | Status: AC
  Filled 2016-11-02: qty 1

## 2016-11-02 MED ORDER — HYDROCODONE-ACETAMINOPHEN 5-325 MG PO TABS: 1.0000 | ORAL_TABLET | ORAL | 0 refills | Status: DC | PRN

## 2016-11-02 MED ORDER — ACETAMINOPHEN 500 MG PO TABS
1000.0000 mg | ORAL_TABLET | ORAL | Status: AC
  Administered 2016-11-02: 1000 mg via ORAL

## 2016-11-02 MED ORDER — PROPOFOL 10 MG/ML IV BOLUS
INTRAVENOUS | Status: AC
  Filled 2016-11-02: qty 20

## 2016-11-02 SURGICAL SUPPLY — 56 items
ADH SKN CLS APL DERMABOND .7 (GAUZE/BANDAGES/DRESSINGS) ×1
BINDER BREAST LRG (GAUZE/BANDAGES/DRESSINGS) IMPLANT
BINDER BREAST XLRG (GAUZE/BANDAGES/DRESSINGS) IMPLANT
BINDER BREAST XXLRG (GAUZE/BANDAGES/DRESSINGS) IMPLANT
BLADE SURG 15 STRL LF DISP TIS (BLADE) ×1 IMPLANT
BLADE SURG 15 STRL SS (BLADE) ×3
CANISTER SUC SOCK COL 7IN (MISCELLANEOUS) IMPLANT
CANISTER SUCT 1200ML W/VALVE (MISCELLANEOUS) IMPLANT
CHLORAPREP W/TINT 26ML (MISCELLANEOUS) ×3 IMPLANT
CLIP TI WIDE RED SMALL 6 (CLIP) IMPLANT
CLOSURE WOUND 1/2 X4 (GAUZE/BANDAGES/DRESSINGS) ×1
COVER BACK TABLE 60X90IN (DRAPES) ×3 IMPLANT
COVER MAYO STAND STRL (DRAPES) ×3 IMPLANT
COVER PROBE W GEL 5X96 (DRAPES) ×3 IMPLANT
DECANTER SPIKE VIAL GLASS SM (MISCELLANEOUS) IMPLANT
DERMABOND ADVANCED (GAUZE/BANDAGES/DRESSINGS) ×2
DERMABOND ADVANCED .7 DNX12 (GAUZE/BANDAGES/DRESSINGS) ×1 IMPLANT
DEVICE DUBIN W/COMP PLATE 8390 (MISCELLANEOUS) ×3 IMPLANT
DRAPE LAPAROSCOPIC ABDOMINAL (DRAPES) ×3 IMPLANT
DRAPE UTILITY XL STRL (DRAPES) ×3 IMPLANT
DRSG TEGADERM 4X4.75 (GAUZE/BANDAGES/DRESSINGS) IMPLANT
ELECT COATED BLADE 2.86 ST (ELECTRODE) ×3 IMPLANT
ELECT REM PT RETURN 9FT ADLT (ELECTROSURGICAL) ×3
ELECTRODE REM PT RTRN 9FT ADLT (ELECTROSURGICAL) ×1 IMPLANT
GAUZE SPONGE 4X4 12PLY STRL LF (GAUZE/BANDAGES/DRESSINGS) IMPLANT
GLOVE BIO SURGEON STRL SZ7 (GLOVE) ×6 IMPLANT
GLOVE BIOGEL PI IND STRL 7.5 (GLOVE) ×1 IMPLANT
GLOVE BIOGEL PI INDICATOR 7.5 (GLOVE) ×2
GOWN STRL REUS W/ TWL LRG LVL3 (GOWN DISPOSABLE) ×2 IMPLANT
GOWN STRL REUS W/TWL LRG LVL3 (GOWN DISPOSABLE) ×6
HEMOSTAT ARISTA ABSORB 3G PWDR (MISCELLANEOUS) IMPLANT
ILLUMINATOR WAVEGUIDE N/F (MISCELLANEOUS) IMPLANT
KIT MARKER MARGIN INK (KITS) ×3 IMPLANT
LIGHT WAVEGUIDE WIDE FLAT (MISCELLANEOUS) IMPLANT
NDL HYPO 25X1 1.5 SAFETY (NEEDLE) ×1 IMPLANT
NEEDLE HYPO 25X1 1.5 SAFETY (NEEDLE) ×3 IMPLANT
NS IRRIG 1000ML POUR BTL (IV SOLUTION) IMPLANT
PACK BASIN DAY SURGERY FS (CUSTOM PROCEDURE TRAY) ×3 IMPLANT
PENCIL BUTTON HOLSTER BLD 10FT (ELECTRODE) ×3 IMPLANT
SLEEVE SCD COMPRESS KNEE MED (MISCELLANEOUS) ×3 IMPLANT
SPONGE LAP 4X18 X RAY DECT (DISPOSABLE) ×3 IMPLANT
STRIP CLOSURE SKIN 1/2X4 (GAUZE/BANDAGES/DRESSINGS) ×2 IMPLANT
SUT MNCRL AB 4-0 PS2 18 (SUTURE) IMPLANT
SUT MON AB 5-0 PS2 18 (SUTURE) IMPLANT
SUT SILK 2 0 SH (SUTURE) IMPLANT
SUT VIC AB 2-0 SH 27 (SUTURE) ×3
SUT VIC AB 2-0 SH 27XBRD (SUTURE) ×1 IMPLANT
SUT VIC AB 3-0 SH 27 (SUTURE) ×3
SUT VIC AB 3-0 SH 27X BRD (SUTURE) ×1 IMPLANT
SUT VIC AB 5-0 PS2 18 (SUTURE) IMPLANT
SYR CONTROL 10ML LL (SYRINGE) ×3 IMPLANT
TOWEL OR 17X24 6PK STRL BLUE (TOWEL DISPOSABLE) ×3 IMPLANT
TOWEL OR NON WOVEN STRL DISP B (DISPOSABLE) ×3 IMPLANT
TUBE CONNECTING 20'X1/4 (TUBING)
TUBE CONNECTING 20X1/4 (TUBING) IMPLANT
YANKAUER SUCT BULB TIP NO VENT (SUCTIONS) IMPLANT

## 2016-11-02 NOTE — Interval H&P Note (Signed)
History and Physical Interval Note:  11/02/2016 9:43 AM  [name]  has presented today for surgery, with the diagnosis of bilateral breast masses  The various methods of treatment have been discussed with the patient and family. After consideration of risks, benefits and other options for treatment, the patient has consented to  Procedure(s): BILATERAL RADIOACTIVE SEED GUIDED EXCISIONAL BREAST BIOPSY LEFT BREAST 2 SEEDS RIGHT BREAST 1 SEED (Bilateral) as a surgical intervention .  The patient's history has been reviewed, patient examined, no change in status, stable for surgery.  I have reviewed the patient's chart and labs.  Questions were answered to the patient's satisfaction.     Hailey Randolph

## 2016-11-02 NOTE — Progress Notes (Signed)
Patient ID: Hailey Randolph, female   DOB: 10/10/1950, 66 y.o.   MRN: [id number] I reviewed NCCSR day of discharge after surgery, norco given

## 2016-11-02 NOTE — Anesthesia Postprocedure Evaluation (Signed)
Anesthesia Post Note  Patient: Hailey Randolph  Procedure(s) Performed: Procedure(s) (LRB): BILATERAL RADIOACTIVE SEED GUIDED EXCISIONAL BREAST BIOPSY LEFT BREAST 2 SEEDS RIGHT BREAST 1 SEED (Bilateral)     Patient location during evaluation: PACU Anesthesia Type: General Level of consciousness: awake and alert and oriented Pain management: pain level controlled Vital Signs Assessment: post-procedure vital signs reviewed and stable Respiratory status: spontaneous breathing, nonlabored ventilation and respiratory function stable Cardiovascular status: blood pressure returned to baseline and stable Postop Assessment: no signs of nausea or vomiting Anesthetic complications: no    Last Vitals:  Vitals:   11/02/16 1130 11/02/16 1145  BP: 134/81 136/88  Pulse: 78 84  Resp: 15 18  Temp:      Last Pain:  Vitals:   11/02/16 1145  TempSrc:   PainSc: 0-No pain                 Sabra Sessler A.

## 2016-11-02 NOTE — Transfer of Care (Signed)
Immediate Anesthesia Transfer of Care Note  Patient: Hailey Randolph  Procedure(s) Performed: Procedure(s): BILATERAL RADIOACTIVE SEED GUIDED EXCISIONAL BREAST BIOPSY LEFT BREAST 2 SEEDS RIGHT BREAST 1 SEED (Bilateral)  Patient Location: PACU  Anesthesia Type:General  Level of Consciousness: awake and patient cooperative  Airway & Oxygen Therapy: Patient Spontanous Breathing and Patient connected to face mask oxygen  Post-op Assessment: Report given to RN and Post -op Vital signs reviewed and stable  Post vital signs: Reviewed and stable  Last Vitals:  Vitals:   11/02/16 0902  BP: (!) 150/80  Pulse: 71  Resp: 18  Temp: 36.6 C    Last Pain:  Vitals:   11/02/16 0902  TempSrc: Oral         Complications: No apparent anesthesia complications

## 2016-11-02 NOTE — Anesthesia Procedure Notes (Signed)
Procedure Name: LMA Insertion Date/Time: 11/02/2016 10:07 AM Performed by: Genelle Bal Pre-anesthesia Checklist: Patient identified, Emergency Drugs available, Suction available and Patient being monitored Patient Re-evaluated:Patient Re-evaluated prior to inductionOxygen Delivery Method: Circle system utilized Preoxygenation: Pre-oxygenation with 100% oxygen Intubation Type: IV induction Ventilation: Mask ventilation without difficulty LMA: LMA inserted LMA Size: 4.0 Number of attempts: 1 Placement Confirmation: positive ETCO2 and breath sounds checked- equal and bilateral Tube secured with: Tape Dental Injury: Teeth and Oropharynx as per pre-operative assessment

## 2016-11-02 NOTE — Op Note (Signed)
Preoperative diagnoses: left breast mammographic distortion times two and right breast mammographic distortion times one all with core biopsies c/w csl Postoperative diagnosis: Same as above Procedure:Leftbreast seed guided excisional biopsy times two, right breast seed guided excisional biopsy Surgeon: Dr. Serita Grammes Anesthesia: Gen. Estimated blood loss: minimal Complications: None Drains: None Specimens:Leftbreast tissue marked with paint medial lesion containing clip and seed, left breast lateral lesion marked with paint containing clip and seed, right breast lesion containing clip and seed Sponge and needle count correct at completion Disposition to recovery stable  Indications: This is a 79 yof with mammogram showing two left breast distortions and one right breast distortion.  All three are biopsied complex sclerosing lesions. We discussed options including observation and she desired excision.  Three seeds were placed and I had reviewed with understanding that distortions were localized as two of the clips had migrated.  I had these mm in the OR>   Procedure: After informed consent was obtained she was then taken to the operating room. She was given cefazolin. Sequential compression devices were on her legs. She was placed under general anesthesia without complication. Herbreasts were then prepped and draped in the standard sterile surgical fashion. A surgical timeout was then performed.  I located the radioactive seeds in the left breast with the neoprobe. I infiltrated marcaine in the area of the seeds.  I made a periareolar incision to hide the scar.  I then used the neoprobe to guide the excision of the seed and surrounding tissue in the lateral position first.This was confirmed by the neoprobe. This was then taken for mammogram which confirmed removal of the seed and the clip. This was confirmed by radiology. This was then sent to pathology. I then located the medial  seed in the left breast via the same incision. I used the neoprobe to removed the seed.  I then confirmed the seed and clip were removed by mammogram. Hemostasis was observed.I closed the breast tissue with a 2-0 Vicryl. The dermis was closed with 3-0 Vicryl and the skin with 5-0 Monocryl.Dermabond and steristrips were placed on the incision.  I then located the radioactive seed in the right breast with the neoprobe. I infiltrated marcaine in the area of the seeds.  I made a curvilinear incision the lateral right breast.I then used the neoprobe to guide the excision of the seed.t.This was confirmed by the neoprobe. This was then taken for mammogram which confirmed removal of the seed and the clip. This was confirmed by radiology. This was then sent to pathology. Hemostasis was observed.I closed the breast tissue with a 2-0 Vicryl. The dermis was closed with 3-0 Vicryl and the skin with 4-0 Monocryl.Dermabond and steristrips were placed on the incision.  A breast binder was placed. She was transferred to recovery stable

## 2016-11-02 NOTE — H&P (Signed)
64 yof referred by Dr Asencion Noble for bilateral breast masses. she has fh of breast cancer in her mother. She has prior benign biopsy. she has no mass or dc. she underwent screening mm with b density breasts. she had bilateral distortions noted initially. diagnostic views showed two left sided distortions and one right sided. 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 she is here to discuss options.   Past Surgical History  Colon Polyp Removal - Colonoscopy  Hysterectomy (not due to cancer) - Complete  Tonsillectomy   Diagnostic Studies History  Colonoscopy  within last year Mammogram  within last year Pap Smear  >5 years ago  Allergies  No Known Allergies  Medication History  Tylenol Arthritis Pain (650MG  Tablet ER, Oral) Active. Aspirin EC (81MG  Tablet DR, Oral) Active. Atorvastatin Calcium (20MG  Tablet, Oral) Active. Levothyroxine Sodium (75MCG Tablet, Oral) Active. LORazepam (1MG  Tablet, Oral) Active. Losartan Potassium (50MG  Tablet, Oral) Active. TraMADol HCl (50MG  Tablet, Oral) Active. Ambien (10MG  Tablet, Oral) Active. Pantoprazole Sodium (40MG  Tablet DR, Oral) Active. HydroCHLOROthiazide (12.5MG  Tablet, Oral) Active. Medications Reconciled  Social History Malachy Moan, [place]; 10/15/2016 9:33 AM) Caffeine use  Carbonated beverages, Coffee, Tea. No alcohol use  No drug use  Tobacco use  Never smoker.  Family History Malachy Moan, [place]; 10/15/2016 9:33 AM) Arthritis  Mother, Sister. Breast Cancer  Mother. Heart Disease  Father. Heart disease in female family member before age 71  Hypertension  Father, Mother.  Pregnancy / Birth History Malachy Moan, [place]; 10/15/2016 9:33 AM) Age at menarche  70 years. Age of menopause  <44 Contraceptive History  Oral contraceptives. Gravida  3 Maternal age  35-25 Para  2  Other Problems Malachy Moan, [place]; 10/15/2016 9:33 AM) High blood pressure   Hypercholesterolemia  Thyroid Disease   Review of Systems Malachy Moan RMA; 10/15/2016 9:33 AM) General Not Present- Appetite Loss, Chills, Fatigue, Fever, Night Sweats, Weight Gain and Weight Loss. Skin Not Present- Change in Wart/Mole, Dryness, Hives, Jaundice, New Lesions, Non-Healing Wounds, Rash and Ulcer. HEENT Present- Wears glasses/contact lenses. Not Present- Earache, Hearing Loss, Hoarseness, Nose Bleed, Oral Ulcers, Ringing in the Ears, Seasonal Allergies, Sinus Pain, Sore Throat, Visual Disturbances and Yellow Eyes. Respiratory Not Present- Bloody sputum, Chronic Cough, Difficulty Breathing, Snoring and Wheezing. Breast Present- Breast Mass. Not Present- Breast Pain, Nipple Discharge and Skin Changes. Cardiovascular Not Present- Chest Pain, Difficulty Breathing Lying Down, Leg Cramps, Palpitations, Rapid Heart Rate, Shortness of Breath and Swelling of Extremities. Gastrointestinal Not Present- Abdominal Pain, Bloating, Bloody Stool, Change in Bowel Habits, Chronic diarrhea, Constipation, Difficulty Swallowing, Excessive gas, Gets full quickly at meals, Hemorrhoids, Indigestion, Nausea, Rectal Pain and Vomiting. Female Genitourinary Not Present- Frequency, Nocturia, Painful Urination, Pelvic Pain and Urgency. Musculoskeletal Not Present- Back Pain, Joint Pain, Joint Stiffness, Muscle Pain, Muscle Weakness and Swelling of Extremities. Neurological Not Present- Decreased Memory, Fainting, Headaches, Numbness, Seizures, Tingling, Tremor, Trouble walking and Weakness. Psychiatric Not Present- Anxiety, Bipolar, Change in Sleep Pattern, Depression, Fearful and Frequent crying. Endocrine Not Present- Cold Intolerance, Excessive Hunger, Hair Changes, Heat Intolerance, Hot flashes and New Diabetes. Hematology Not Present- Blood Thinners, Easy Bruising, Excessive bleeding, Gland problems, HIV and Persistent Infections.  Vitals Malachy Moan RMA; 10/15/2016 9:36 AM) 10/15/2016 9:35  AM Weight: 177.4 lb Height: 62in Body Surface Area: 1.82 m Body Mass Index: 32.45 kg/m  Temp.: 97.71F  Pulse: 89 (Regular)  BP: 160/110 (Sitting, Left Arm, Standard) Physical Exam Rolm Bookbinder MD; 10/15/2016 10:10 AM) General Mental Status-Alert. Head and Neck  Note: no thyromegaly Eye Sclera/Conjunctiva - Bilateral-No scleral icterus. Chest and Lung Exam Chest and lung exam reveals -quiet, even and easy respiratory effort with no use of accessory muscles and on auscultation, normal breath sounds, no adventitious sounds and normal vocal resonance. Breast Nipples-No Discharge. Breast Lump-No Palpable Breast Mass. Cardiovascular Cardiovascular examination reveals -normal heart sounds, regular rate and rhythm with no murmurs. Abdomen Note: soft nt Neurologic Neurologic evaluation reveals -alert and oriented x 3 with no impairment of recent or remote memory. Motor-Normal. Lymphatic Head & Neck General Head & Neck Lymphatics: Bilateral - Description - Normal. Axillary General Axillary Region: Bilateral - Description - Normal. Note: no Sturgis adenopathy   Assessment & Plan Rolm Bookbinder MD; 10/15/2016 10:14 AM) RADIAL SCAR OF BREAST (N64.89) Story: left breast seed guided excision times two, right breast seed guided excision we discussed pathology and option of observation. she would like areas removed. we discussed up to 10% upgrade risk to atypia or cancer (this is less likely). we discussed seed guided excisions of all three areas and recovery.

## 2016-11-02 NOTE — Anesthesia Preprocedure Evaluation (Addendum)
Anesthesia Evaluation  Patient identified by MRN, date of birth, ID band Patient awake    Reviewed: Allergy & Precautions, NPO status , Patient's Chart, lab work & pertinent test results  Airway Mallampati: II  TM Distance: >3 FB Neck ROM: Full   Comment: Small mouth Dental no notable dental hx. (+) Teeth Intact   Pulmonary neg pulmonary ROS,    Pulmonary exam normal breath sounds clear to auscultation       Cardiovascular hypertension, Pt. on medications Normal cardiovascular exam Rhythm:Regular Rate:Normal     Neuro/Psych Anxiety negative neurological ROS     GI/Hepatic   Endo/Other  Hypothyroidism Hyperlipidemia Bilateral breast masses Obesity  Renal/GU   negative genitourinary   Musculoskeletal negative musculoskeletal ROS (+)   Abdominal (+) + obese,   Peds  Hematology negative hematology ROS (+)   Anesthesia Other Findings   Reproductive/Obstetrics                            Anesthesia Physical Anesthesia Plan  ASA: II  Anesthesia Plan: General   Post-op Pain Management:    Induction: Intravenous  PONV Risk Score and Plan: 4 or greater and Ondansetron, Dexamethasone, Propofol, Midazolam and Metaclopromide  Airway Management Planned: LMA  Additional Equipment:   Intra-op Plan:   Post-operative Plan: Extubation in OR  Informed Consent: I have reviewed the patients History and Physical, chart, labs and discussed the procedure including the risks, benefits and alternatives for the proposed anesthesia with the patient or authorized representative who has indicated his/her understanding and acceptance.   Dental advisory given  Plan Discussed with: Surgeon, CRNA and Anesthesiologist  Anesthesia Plan Comments:         Anesthesia Quick Evaluation

## 2016-11-02 NOTE — Discharge Instructions (Signed)
[facility] °Office Phone Number [phone] ° °POST OP INSTRUCTIONS ° °Always review your discharge instruction sheet given to you by the facility where your surgery was performed. ° °IF YOU HAVE DISABILITY OR FAMILY LEAVE FORMS, YOU MUST BRING THEM TO THE OFFICE FOR PROCESSING.  DO NOT GIVE THEM TO YOUR DOCTOR. ° °1. A prescription for pain medication may be given to you upon discharge.  Take your pain medication as prescribed, if needed.  If narcotic pain medicine is not needed, then you may take acetaminophen (Tylenol), naprosyn (Alleve) or ibuprofen (Advil) as needed. °2. Take your usually prescribed medications unless otherwise directed °3. If you need a refill on your pain medication, please contact your pharmacy.  They will contact our office to request authorization.  Prescriptions will not be filled after 5pm or on week-ends. °4. You should eat very light the first 24 hours after surgery, such as soup, crackers, pudding, etc.  Resume your normal diet the day after surgery. °5. Most patients will experience some swelling and bruising in the breast.  Ice packs and a good support bra will help.  Wear the breast binder provided or a sports bra for 72 hours day and night.  After that wear a sports bra during the day until you return to the office. Swelling and bruising can take several days to resolve.  °6. It is common to experience some constipation if taking pain medication after surgery.  Increasing fluid intake and taking a stool softener will usually help or prevent this problem from occurring.  A mild laxative (Milk of Magnesia or Miralax) should be taken according to package directions if there are no bowel movements after 48 hours. °7. Unless discharge instructions indicate otherwise, you may remove your bandages 48 hours after surgery and you may shower at that time.  You may have steri-strips (small skin tapes) in place directly over the incision.  These strips should be left on the  skin for 7-10 days and will come off on their own.  If your surgeon used skin glue on the incision, you may shower in 24 hours.  The glue will flake off over the next 2-3 weeks.  Any sutures or staples will be removed at the office during your follow-up visit. °8. ACTIVITIES:  You may resume regular daily activities (gradually increasing) beginning the next day.  Wearing a good support bra or sports bra minimizes pain and swelling.  You may have sexual intercourse when it is comfortable. °a. You may drive when you no longer are taking prescription pain medication, you can comfortably wear a seatbelt, and you can safely maneuver your car and apply brakes. °b. RETURN TO WORK:  ______________________________________________________________________________________ °9. You should see your doctor in the office for a follow-up appointment approximately two weeks after your surgery.  Your doctor’s nurse will typically make your follow-up appointment when she calls you with your pathology report.  Expect your pathology report 3-4 business days after your surgery.  You may call to check if you do not hear from us after three days. °10. OTHER INSTRUCTIONS: _______________________________________________________________________________________________ _____________________________________________________________________________________________________________________________________ °_____________________________________________________________________________________________________________________________________ °_____________________________________________________________________________________________________________________________________ ° °WHEN TO CALL DR WAKEFIELD: °1. Fever over 101.0 °2. Nausea and/or vomiting. °3. Extreme swelling or bruising. °4. Continued bleeding from incision. °5. Increased pain, redness, or drainage from the incision. ° °The clinic staff is available to answer your questions during regular  business hours.  Please don’t hesitate to call and ask to speak to one of the nurses for clinical concerns.  If   you have a medical emergency, go to the nearest emergency room or call 911.  A surgeon from [facility] is always on call at the hospital. ° °For further questions, please visit [email or web address] ° ° ° ° ° °Post Anesthesia Home Care Instructions ° °Activity: °Get plenty of rest for the remainder of the day. A responsible individual must stay with you for 24 hours following the procedure.  °For the next 24 hours, DO NOT: °-Drive a car °-Operate machinery °-Drink alcoholic beverages °-Take any medication unless instructed by your physician °-Make any legal decisions or sign important papers. ° °Meals: °Start with liquid foods such as gelatin or soup. Progress to regular foods as tolerated. Avoid greasy, spicy, heavy foods. If nausea and/or vomiting occur, drink only clear liquids until the nausea and/or vomiting subsides. Call your physician if vomiting continues. ° °Special Instructions/Symptoms: °Your throat may feel dry or sore from the anesthesia or the breathing tube placed in your throat during surgery. If this causes discomfort, gargle with warm salt water. The discomfort should disappear within 24 hours. ° °If you had a scopolamine patch placed behind your ear for the management of post- operative nausea and/or vomiting: ° °1. The medication in the patch is effective for 72 hours, after which it should be removed.  Wrap patch in a tissue and discard in the trash. Wash hands thoroughly with soap and water. °2. You may remove the patch earlier than 72 hours if you experience unpleasant side effects which may include dry mouth, dizziness or visual disturbances. °3. Avoid touching the patch. Wash your hands with soap and water after contact with the patch. °  ° °

## 2016-11-03 ENCOUNTER — Ambulatory Visit: Payer: PPO | Admitting: Gastroenterology

## 2016-11-03 ENCOUNTER — Encounter (HOSPITAL_BASED_OUTPATIENT_CLINIC_OR_DEPARTMENT_OTHER): Payer: Self-pay | Admitting: General Surgery

## 2016-12-07 ENCOUNTER — Other Ambulatory Visit: Payer: Self-pay | Admitting: Gastroenterology

## 2016-12-09 DIAGNOSIS — E039 Hypothyroidism, unspecified: Secondary | ICD-10-CM | POA: Diagnosis not present

## 2016-12-09 DIAGNOSIS — E785 Hyperlipidemia, unspecified: Secondary | ICD-10-CM | POA: Diagnosis not present

## 2016-12-09 DIAGNOSIS — M1990 Unspecified osteoarthritis, unspecified site: Secondary | ICD-10-CM | POA: Diagnosis not present

## 2016-12-09 DIAGNOSIS — Z79899 Other long term (current) drug therapy: Secondary | ICD-10-CM | POA: Diagnosis not present

## 2016-12-09 DIAGNOSIS — I10 Essential (primary) hypertension: Secondary | ICD-10-CM | POA: Diagnosis not present

## 2016-12-17 DIAGNOSIS — E785 Hyperlipidemia, unspecified: Secondary | ICD-10-CM | POA: Diagnosis not present

## 2016-12-17 DIAGNOSIS — I10 Essential (primary) hypertension: Secondary | ICD-10-CM | POA: Diagnosis not present

## 2016-12-17 DIAGNOSIS — Z23 Encounter for immunization: Secondary | ICD-10-CM | POA: Diagnosis not present

## 2016-12-17 DIAGNOSIS — Z0001 Encounter for general adult medical examination with abnormal findings: Secondary | ICD-10-CM | POA: Diagnosis not present

## 2016-12-17 DIAGNOSIS — Z6833 Body mass index (BMI) 33.0-33.9, adult: Secondary | ICD-10-CM | POA: Diagnosis not present

## 2017-02-11 DIAGNOSIS — Z23 Encounter for immunization: Secondary | ICD-10-CM | POA: Diagnosis not present

## 2017-04-20 DIAGNOSIS — H40053 Ocular hypertension, bilateral: Secondary | ICD-10-CM | POA: Diagnosis not present

## 2017-06-17 DIAGNOSIS — S61011A Laceration without foreign body of right thumb without damage to nail, initial encounter: Secondary | ICD-10-CM | POA: Diagnosis not present

## 2017-06-17 DIAGNOSIS — I10 Essential (primary) hypertension: Secondary | ICD-10-CM | POA: Diagnosis not present

## 2017-08-24 DIAGNOSIS — E039 Hypothyroidism, unspecified: Secondary | ICD-10-CM | POA: Diagnosis not present

## 2018-03-16 DIAGNOSIS — I10 Essential (primary) hypertension: Secondary | ICD-10-CM | POA: Diagnosis not present

## 2018-03-16 DIAGNOSIS — E039 Hypothyroidism, unspecified: Secondary | ICD-10-CM | POA: Diagnosis not present

## 2018-03-16 DIAGNOSIS — G4700 Insomnia, unspecified: Secondary | ICD-10-CM | POA: Diagnosis not present

## 2018-03-16 DIAGNOSIS — Z79899 Other long term (current) drug therapy: Secondary | ICD-10-CM | POA: Diagnosis not present

## 2018-03-29 DIAGNOSIS — Z6837 Body mass index (BMI) 37.0-37.9, adult: Secondary | ICD-10-CM | POA: Diagnosis not present

## 2018-03-29 DIAGNOSIS — I10 Essential (primary) hypertension: Secondary | ICD-10-CM | POA: Diagnosis not present

## 2018-03-29 DIAGNOSIS — E785 Hyperlipidemia, unspecified: Secondary | ICD-10-CM | POA: Diagnosis not present

## 2018-03-29 DIAGNOSIS — Z0001 Encounter for general adult medical examination with abnormal findings: Secondary | ICD-10-CM | POA: Diagnosis not present

## 2018-03-29 DIAGNOSIS — E039 Hypothyroidism, unspecified: Secondary | ICD-10-CM | POA: Diagnosis not present

## 2018-06-06 ENCOUNTER — Other Ambulatory Visit (HOSPITAL_COMMUNITY): Payer: Self-pay | Admitting: Internal Medicine

## 2018-06-06 DIAGNOSIS — Z1231 Encounter for screening mammogram for malignant neoplasm of breast: Secondary | ICD-10-CM

## 2018-07-05 ENCOUNTER — Ambulatory Visit
Admission: RE | Admit: 2018-07-05 | Discharge: 2018-07-05 | Disposition: A | Discharge status: Home / Self Care | Payer: Medicare Other | Source: Ambulatory Visit | Attending: Internal Medicine | Admitting: Internal Medicine

## 2018-07-05 DIAGNOSIS — Z1231 Encounter for screening mammogram for malignant neoplasm of breast: Secondary | ICD-10-CM

## 2018-08-02 DIAGNOSIS — Z6834 Body mass index (BMI) 34.0-34.9, adult: Secondary | ICD-10-CM | POA: Diagnosis not present

## 2018-08-02 DIAGNOSIS — I10 Essential (primary) hypertension: Secondary | ICD-10-CM | POA: Diagnosis not present

## 2018-08-02 DIAGNOSIS — G4700 Insomnia, unspecified: Secondary | ICD-10-CM | POA: Diagnosis not present

## 2018-12-13 DIAGNOSIS — I10 Essential (primary) hypertension: Secondary | ICD-10-CM | POA: Diagnosis not present

## 2018-12-13 DIAGNOSIS — M1990 Unspecified osteoarthritis, unspecified site: Secondary | ICD-10-CM | POA: Diagnosis not present

## 2019-03-29 DIAGNOSIS — Z23 Encounter for immunization: Secondary | ICD-10-CM | POA: Diagnosis not present

## 2019-03-30 DIAGNOSIS — I10 Essential (primary) hypertension: Secondary | ICD-10-CM | POA: Diagnosis not present

## 2019-03-30 DIAGNOSIS — E039 Hypothyroidism, unspecified: Secondary | ICD-10-CM | POA: Diagnosis not present

## 2019-03-30 DIAGNOSIS — Z79899 Other long term (current) drug therapy: Secondary | ICD-10-CM | POA: Diagnosis not present

## 2019-03-30 DIAGNOSIS — F419 Anxiety disorder, unspecified: Secondary | ICD-10-CM | POA: Diagnosis not present

## 2019-03-30 DIAGNOSIS — E785 Hyperlipidemia, unspecified: Secondary | ICD-10-CM | POA: Diagnosis not present

## 2019-03-30 DIAGNOSIS — G4700 Insomnia, unspecified: Secondary | ICD-10-CM | POA: Diagnosis not present

## 2019-04-06 DIAGNOSIS — E039 Hypothyroidism, unspecified: Secondary | ICD-10-CM | POA: Diagnosis not present

## 2019-04-06 DIAGNOSIS — I10 Essential (primary) hypertension: Secondary | ICD-10-CM | POA: Diagnosis not present

## 2019-04-06 DIAGNOSIS — E785 Hyperlipidemia, unspecified: Secondary | ICD-10-CM | POA: Diagnosis not present

## 2019-04-06 DIAGNOSIS — E876 Hypokalemia: Secondary | ICD-10-CM | POA: Diagnosis not present

## 2019-07-06 DIAGNOSIS — Z23 Encounter for immunization: Secondary | ICD-10-CM | POA: Diagnosis not present

## 2019-07-10 DIAGNOSIS — R1110 Vomiting, unspecified: Secondary | ICD-10-CM | POA: Diagnosis not present

## 2019-07-10 DIAGNOSIS — R1010 Upper abdominal pain, unspecified: Secondary | ICD-10-CM | POA: Diagnosis not present

## 2019-07-10 DIAGNOSIS — K819 Cholecystitis, unspecified: Secondary | ICD-10-CM | POA: Diagnosis not present

## 2019-07-20 ENCOUNTER — Inpatient Hospital Stay (HOSPITAL_COMMUNITY)
Admission: EM | Admit: 2019-07-20 | Discharge: 2019-07-27 | DRG: 331 | Disposition: A | Discharge status: Home / Self Care | Payer: Medicare Other | Attending: General Surgery | Admitting: General Surgery

## 2019-07-20 ENCOUNTER — Encounter (HOSPITAL_COMMUNITY): Payer: Self-pay | Admitting: Emergency Medicine

## 2019-07-20 ENCOUNTER — Other Ambulatory Visit: Payer: Self-pay

## 2019-07-20 ENCOUNTER — Emergency Department (HOSPITAL_COMMUNITY): Payer: Medicare Other

## 2019-07-20 DIAGNOSIS — F419 Anxiety disorder, unspecified: Secondary | ICD-10-CM | POA: Diagnosis present

## 2019-07-20 DIAGNOSIS — R109 Unspecified abdominal pain: Secondary | ICD-10-CM | POA: Diagnosis not present

## 2019-07-20 DIAGNOSIS — Z8261 Family history of arthritis: Secondary | ICD-10-CM

## 2019-07-20 DIAGNOSIS — Z90710 Acquired absence of both cervix and uterus: Secondary | ICD-10-CM

## 2019-07-20 DIAGNOSIS — Z853 Personal history of malignant neoplasm of breast: Secondary | ICD-10-CM

## 2019-07-20 DIAGNOSIS — E039 Hypothyroidism, unspecified: Secondary | ICD-10-CM | POA: Diagnosis present

## 2019-07-20 DIAGNOSIS — E785 Hyperlipidemia, unspecified: Secondary | ICD-10-CM | POA: Diagnosis not present

## 2019-07-20 DIAGNOSIS — K5669 Other partial intestinal obstruction: Secondary | ICD-10-CM

## 2019-07-20 DIAGNOSIS — K56609 Unspecified intestinal obstruction, unspecified as to partial versus complete obstruction: Secondary | ICD-10-CM

## 2019-07-20 DIAGNOSIS — E876 Hypokalemia: Secondary | ICD-10-CM | POA: Diagnosis not present

## 2019-07-20 DIAGNOSIS — Z01818 Encounter for other preprocedural examination: Secondary | ICD-10-CM

## 2019-07-20 DIAGNOSIS — C50919 Malignant neoplasm of unspecified site of unspecified female breast: Secondary | ICD-10-CM

## 2019-07-20 DIAGNOSIS — C7A019 Malignant carcinoid tumor of the small intestine, unspecified portion: Principal | ICD-10-CM | POA: Diagnosis present

## 2019-07-20 DIAGNOSIS — Z803 Family history of malignant neoplasm of breast: Secondary | ICD-10-CM

## 2019-07-20 DIAGNOSIS — Z20822 Contact with and (suspected) exposure to covid-19: Secondary | ICD-10-CM | POA: Diagnosis not present

## 2019-07-20 DIAGNOSIS — I10 Essential (primary) hypertension: Secondary | ICD-10-CM | POA: Diagnosis present

## 2019-07-20 DIAGNOSIS — K5750 Diverticulosis of both small and large intestine without perforation or abscess without bleeding: Secondary | ICD-10-CM | POA: Diagnosis present

## 2019-07-20 DIAGNOSIS — K660 Peritoneal adhesions (postprocedural) (postinfection): Secondary | ICD-10-CM | POA: Diagnosis present

## 2019-07-20 DIAGNOSIS — Z79890 Hormone replacement therapy (postmenopausal): Secondary | ICD-10-CM

## 2019-07-20 DIAGNOSIS — Z79899 Other long term (current) drug therapy: Secondary | ICD-10-CM

## 2019-07-20 DIAGNOSIS — G4700 Insomnia, unspecified: Secondary | ICD-10-CM | POA: Diagnosis present

## 2019-07-20 DIAGNOSIS — R112 Nausea with vomiting, unspecified: Secondary | ICD-10-CM | POA: Diagnosis present

## 2019-07-20 DIAGNOSIS — Z86010 Personal history of colonic polyps: Secondary | ICD-10-CM

## 2019-07-20 DIAGNOSIS — Z8249 Family history of ischemic heart disease and other diseases of the circulatory system: Secondary | ICD-10-CM

## 2019-07-20 DIAGNOSIS — Z8371 Family history of colonic polyps: Secondary | ICD-10-CM

## 2019-07-20 LAB — CBC
HCT: 42.2 % (ref 36.0–46.0)
Hemoglobin: 14.3 g/dL (ref 12.0–15.0)
MCH: 31.8 pg (ref 26.0–34.0)
MCHC: 33.9 g/dL (ref 30.0–36.0)
MCV: 93.8 fL (ref 80.0–100.0)
Platelets: 287 10*3/uL (ref 150–400)
RBC: 4.5 MIL/uL (ref 3.87–5.11)
RDW: 12.7 % (ref 11.5–15.5)
WBC: 9 10*3/uL (ref 4.0–10.5)
nRBC: 0 % (ref 0.0–0.2)

## 2019-07-20 LAB — COMPREHENSIVE METABOLIC PANEL
ALT: 24 U/L (ref 0–44)
AST: 19 U/L (ref 15–41)
Albumin: 3.7 g/dL (ref 3.5–5.0)
Alkaline Phosphatase: 78 U/L (ref 38–126)
Anion gap: 12 (ref 5–15)
BUN: 18 mg/dL (ref 8–23)
CO2: 26 mmol/L (ref 22–32)
Calcium: 9 mg/dL (ref 8.9–10.3)
Chloride: 101 mmol/L (ref 98–111)
Creatinine, Ser: 1.02 mg/dL — ABNORMAL HIGH (ref 0.44–1.00)
GFR calc Af Amer: >60 mL/min (ref >60)
GFR calc non Af Amer: 56 mL/min — ABNORMAL LOW (ref >60)
Glucose, Bld: 104 mg/dL — ABNORMAL HIGH (ref 70–99)
Potassium: 3 mmol/L — ABNORMAL LOW (ref 3.5–5.1)
Sodium: 139 mmol/L (ref 135–145)
Total Bilirubin: 0.8 mg/dL (ref 0.3–1.2)
Total Protein: 6.5 g/dL (ref 6.5–8.1)

## 2019-07-20 LAB — LIPASE, BLOOD: Lipase: 16 U/L (ref 11–51)

## 2019-07-20 MED ORDER — SODIUM CHLORIDE 0.9 % IV SOLN: INTRAVENOUS | Status: DC

## 2019-07-20 MED ORDER — SODIUM CHLORIDE 0.9% FLUSH: 3.0000 mL | Freq: Once | INTRAVENOUS | Status: DC

## 2019-07-20 MED ORDER — IOHEXOL 300 MG/ML  SOLN
100.0000 mL | Freq: Once | INTRAMUSCULAR | Status: AC | PRN
  Administered 2019-07-20: 100 mL via INTRAVENOUS

## 2019-07-20 NOTE — H&P (Signed)
History and Physical    Patient Demographics:    Hailey Randolph DOB: 04-Jan-1951 DOA: 07/20/2019  PCP: Asencion Noble, MD  Patient coming from: Home  I have personally briefly reviewed patient's old medical records in [place]  Chief Complaint: Nausea, vomiting, abdominal pain   Assessment & Plan:     Assessment/Plan Principal Problem:   SBO (small bowel obstruction) ([place]) Active Problems:   Nausea with vomiting   Breast cancer ([place])     Principal Problem: Partial small bowel obstruction possibly secondary to malignant lesion Patient presented with a 6-week history of intermittent abdominal pain, nausea, vomiting, diarrhea.  CT of the abdomen shows a long segment of mid to distal small bowel with air-fluid distention and a focal distal transition point with a hyperenhancing 0.7 x 1.5 cm nodule.  This likely represents a partial small bowel obstructions secondary to obstructive lesion, possibly malignancy. No known history of malignancy. Did have recent colonoscopy 3.5 years ago which was normal.  Does have multiple family members with breast and colon cancer. Prior abdominal surgery in the form of a total abdominal hysterectomy with appendectomy. -We will keep n.p.o. -Pain meds as needed -IV fluid resuscitation -General surgery consult in a.m. we will also likely need GI evaluation. -CEA level ordered  Other Active Problems: Hypertension -Continue losartan, hydrochlorothiazide  Hyperlipidemia -Continue Lipitor  Hypothyroidism -Continue levothyroxine  Anxiety disorder -Continue Ativan, Ambien as needed   DVT prophylaxis: Lovenox Code Status:  Full code Family Communication: N/A  Disposition Plan: admitted as inpatient for SBO, general surgery consult   Consults called: N/A Admission status: inpatient status     HPI:     HPI: Hailey Randolph is a 68 y.o. female with medical history significant of hypertension, hyperlipidemia,  hypothyroidism, anxiety disorder who presented to the ER with nausea, vomiting, abdominal pain.  Patient states she has been having intermittent nausea, vomiting, abdominal pain for the last about 6 weeks.  She has been having almost daily episodes of vomiting about once or twice a day.  She has persistent nausea.  She also has generalized abdominal discomfort and pain occurring intermittently associated with cramps.  Has continued to have bowel movements.  Has had occasional diarrhea.  Has been able to tolerate p.o. intake but has had loss of appetite.  No fever, chills, chest pain, shortness of breath, cough, dysuria, hematemesis, melena, hematochezia. ED Course:  Vital Signs reviewed on presentation, significant for temperature 97.3, HR 80, BP 137/83, saturation 99% on room air.  Labs reviewed, significant for sodium 139, potassium 3.0, BUN 18, creat 1.02, LFTs within normal limits. WBC 9.0, Hb 14.3, Hct 42, platelets 287.  Imaging personally Reviewed, CT of the abdomen pelvis shows small air and fluid-filled duodenal diverticula noted above the level of the ligament of Treitz.  There is a long segment of mid to distal small bowel demonstrating air-fluid distention.  A proximal transition point is not clearly identified however there is a focal distal transition at the level of the hyperenhancing nodule within the midline of the abdomen.  Small distal bowel is largely decompressed.   Review of systems:    Review of Systems: As per HPI otherwise 10 point review of systems negative.  All other review of systems is negative except the ones noted above in the HPI.    Past Medical and Surgical History:  Reviewed by me  Past Medical History:  Diagnosis Date  . Adenomatous colon polyp   . Anxiety   .  HTN (hypertension)   . Hyperlipidemia   . Hypothyroidism     Past Surgical History:  Procedure Laterality Date  . ABDOMINAL HYSTERECTOMY    . BIOPSY  08/14/2016   Procedure: BIOPSY;  Surgeon:  Daneil Dolin, MD;  [facility];  Service: Endoscopy;;  gastric  . BREAST EXCISIONAL BIOPSY Left 10/30/2016  . BREAST EXCISIONAL BIOPSY Right 10/30/2016  . COLONOSCOPY  April 2007   Adenomatous polyp, pedunculated, at 30 cm. Scattered left-sided diverticula  . COLONOSCOPY N/A 01/17/2016   Dr. Gala Romney: 5 mm polyp in the cecum, tubular adenoma. Scattered small and large mouth diverticula in the sigmoid colon. Next colonoscopy 5 years.  . ESOPHAGOGASTRODUODENOSCOPY N/A 08/14/2016   Procedure: ESOPHAGOGASTRODUODENOSCOPY (EGD);  Surgeon: Daneil Dolin, MD;  [facility];  Service: Endoscopy;  Laterality: N/A;  215  . hysterectomy     complete  . RADIOACTIVE SEED GUIDED EXCISIONAL BREAST BIOPSY Bilateral 11/02/2016   Procedure: BILATERAL RADIOACTIVE SEED GUIDED EXCISIONAL BREAST BIOPSY LEFT BREAST 2 SEEDS RIGHT BREAST 1 SEED;  Surgeon: Rolm Bookbinder, MD;  Location: [facility];  Service: General;  Laterality: Bilateral;  . TONSILLECTOMY       Social History:  Reviewed by me   reports that she has never smoked. She has never used smokeless tobacco. She reports that she does not drink alcohol or use drugs.  Allergies:    No Known Allergies  Family History :   Family History  Problem Relation Age of Onset  . Colon polyps Mother   . Breast cancer Mother 74  . Heart attack Father 23       deceased  . Arthritis Sister   . Liver disease Neg Hx    Family history reviewed, noted as above, not pertinent to current presentation.   Home Medications:    Prior to Admission medications   Medication Sig Start Date End Date Taking? Authorizing Provider  atorvastatin (LIPITOR) 20 MG tablet Take 20 mg by mouth at bedtime.    Yes [provider]  diphenoxylate-atropine (LOMOTIL) 2.5-0.025 MG tablet Take 1 tablet by mouth every 4 (four) hours as needed. 07/11/19  Yes [provider]  hydrochlorothiazide (HYDRODIURIL) 25 MG tablet Take 25 mg by  mouth daily. 06/03/19  Yes [provider]  levothyroxine (SYNTHROID, LEVOTHROID) 75 MCG tablet Take 75 mcg by mouth daily before breakfast.    Yes [provider]  LORazepam (ATIVAN) 1 MG tablet Take 1 mg by mouth daily as needed for anxiety.    Yes [provider]  losartan (COZAAR) 100 MG tablet Take 100 mg by mouth daily. 06/03/19  Yes [provider]  zolpidem (AMBIEN) 10 MG tablet Take 10 mg by mouth at bedtime as needed for sleep.   Yes [provider]    Physical Exam:    Physical Exam: Vitals:   07/20/19 1820 07/20/19 1822 07/20/19 2236 07/20/19 2300  BP: 115/83  131/69 137/83  Pulse: (!) 105   80  Resp: 17     Temp: (!) 97.3 F (36.3 C)     TempSrc: Oral     SpO2: 94%   97%  Weight:  81.6 kg    Height:  5\' 3"  (1.6 m)      Constitutional: NAD, calm, comfortable Vitals:   07/20/19 1820 07/20/19 1822 07/20/19 2236 07/20/19 2300  BP: 115/83  131/69 137/83  Pulse: (!) 105   80  Resp: 17     Temp: (!) 97.3 F (36.3  C)     TempSrc: Oral     SpO2: 94%   97%  Weight:  81.6 kg    Height:  5\' 3"  (1.6 m)     Eyes: PERRL, lids and conjunctivae normal ENMT: Mucous membranes are moist. Posterior pharynx clear of any exudate or lesions.Normal dentition.  Neck: normal, supple, no masses, no thyromegaly Respiratory: clear to auscultation bilaterally, no wheezing, no crackles. Normal respiratory effort. No accessory muscle use.  Cardiovascular: Regular rate and rhythm, no murmurs / rubs / gallops. No extremity edema. 2+ pedal pulses. No carotid bruits.  Abdomen: Abdomen is distended.  Mild generalized tenderness, no significant guarding or rigidity.  No hepatosplenomegaly. Bowel sounds positive.  Musculoskeletal: no clubbing / cyanosis. No joint deformity upper and lower extremities. Good ROM, no contractures. Normal muscle tone.  Skin: no rashes, lesions, ulcers. No induration Neurologic: CN 2-12 grossly intact. Sensation intact, DTR  normal. Strength 5/5 in all 4.  Psychiatric: Normal judgment and insight. Alert and oriented x 3. Normal mood.    Decubitus Ulcers: Not present on admission Catheters and tubes: None  Data Review:    Labs on Admission: I have personally reviewed following labs and imaging studies  CBC: Recent Labs  Lab 07/20/19 2035  WBC 9.0  HGB 14.3  HCT 42.2  MCV 93.8  PLT [id number]   Basic Metabolic Panel: Recent Labs  Lab 07/20/19 2035  NA 139  K 3.0*  CL 101  CO2 26  GLUCOSE 104*  BUN 18  CREATININE 1.02*  CALCIUM 9.0   GFR: Estimated Creatinine Clearance: 52.7 mL/min (A) (by C-G formula based on SCr of 1.02 mg/dL (H)). Liver Function Tests: Recent Labs  Lab 07/20/19 2035  AST 19  ALT 24  ALKPHOS 78  BILITOT 0.8  PROT 6.5  ALBUMIN 3.7   Recent Labs  Lab 07/20/19 2035  LIPASE 16   No results for input(s): AMMONIA in the last 168 hours. Coagulation Profile: No results for input(s): INR, PROTIME in the last 168 hours. Cardiac Enzymes: No results for input(s): CKTOTAL, CKMB, CKMBINDEX, TROPONINI in the last 168 hours. BNP (last 3 results) No results for input(s): PROBNP in the last 8760 hours. HbA1C: No results for input(s): HGBA1C in the last 72 hours. CBG: No results for input(s): GLUCAP in the last 168 hours. Lipid Profile: No results for input(s): CHOL, HDL, LDLCALC, TRIG, CHOLHDL, LDLDIRECT in the last 72 hours. Thyroid Function Tests: No results for input(s): TSH, T4TOTAL, FREET4, T3FREE, THYROIDAB in the last 72 hours. Anemia Panel: No results for input(s): VITAMINB12, FOLATE, FERRITIN, TIBC, IRON, RETICCTPCT in the last 72 hours. Urine analysis: No results found for: COLORURINE, APPEARANCEUR, LABSPEC, [place], GLUCOSEU, HGBUR, BILIRUBINUR, KETONESUR, PROTEINUR, UROBILINOGEN, NITRITE, LEUKOCYTESUR   Imaging Results:      Radiological Exams on Admission: CT Abdomen Pelvis W Contrast  Result Date: 07/20/2019 CLINICAL DATA:  Abdominal pain emesis for 5  weeks EXAM: CT ABDOMEN AND PELVIS WITH CONTRAST TECHNIQUE: Multidetector CT imaging of the abdomen and pelvis was performed using the standard protocol following bolus administration of intravenous contrast. CONTRAST:  130mL OMNIPAQUE IOHEXOL 300 MG/ML  SOLN COMPARISON:  HIDA scan 07/27/2016 FINDINGS: Lower chest: Lung bases are clear. Normal heart size. No pericardial effusion. Hepatobiliary: No focal liver abnormality is seen. No gallstones, gallbladder wall thickening, or biliary dilatation. Pancreas: Unremarkable. No pancreatic ductal dilatation or surrounding inflammatory changes. Spleen: Normal in size without focal abnormality. Adrenals/Urinary Tract: Kidneys are somewhat diminutive. Few parapelvic cysts are noted for instance a 1.6  cm cyst in the interpolar left kidney (2/34). No suspicious renal lesions. No urolithiasis or hydronephrosis. Urinary bladder is unremarkable. Stomach/Bowel: Distal esophagus and stomach are free of acute abnormality. Duodenum takes a normal sweep across the abdomen. Small air and fluid-filled duodenal diverticular noted about the level distal abrupt transition at the level of the ligament of Treitz (2/36) and just distally associated with the proximal jejunum (2/29). There is a long segment of mid to distal small bowel demonstrating air and fluid distention. A proximal transition point is not clearly identified however there is focal distal transition at the level of a hyperenhancing nodule with in the low midline abdomen (2/72). Small bowel distal to this focus is largely decompressed. A geometric radiodensity within the distended small bowel segment (2/44) may reflect a medication or other ingested material. Cecum is slightly displaced towards the midline abdomen. Appendix is not well seen. No colonic dilatation or wall thickening. Vascular/Lymphatic: The aorta is normal caliber. No suspicious or enlarged lymph nodes in the included lymphatic chains. Reproductive: Uterus is  surgically absent. No concerning adnexal lesions. Other: No free fluid. No free air. No organized abscess or collection. No bowel containing hernia. Musculoskeletal: Multilevel degenerative changes are present in the imaged portions of the spine. No acute osseous abnormality or suspicious osseous lesion. Mild dextrocurvature of the spine with an apex at the thoracolumbar junction. IMPRESSION: Long segment of mid to distal small bowel demonstrating air and fluid distention with focal distal transition point at the level of a hyperenhancing 0.7 x 1.5 cm nodule in the low midline abdomen (2/71). Findings are concerning for at least partial small bowel obstruction. The appearance of this lesion is worrisome for possible malignancy either primary or metastatic. Electronically Signed   By: Lovena Le M.D.   On: 07/20/2019 22:50      Amelia Burgard Ginette Otto MD [facility]  If 7PM-7AM, please contact night-coverage   07/20/2019, 11:15 PM

## 2019-07-20 NOTE — ED Triage Notes (Signed)
Patient reports abdominal pain and emesis x 5 weeks. Patient states she is now unable to keep anything down and the pain has become much more intense.

## 2019-07-20 NOTE — ED Provider Notes (Signed)
[facility] EMERGENCY DEPARTMENT Provider Note   CSN: [id number] Arrival date & time: 07/20/19  1811     History Chief Complaint  Patient presents with  . Abdominal Pain    Hailey Randolph is a 69 y.o. female.  Patient presenting with about a 5-week history of abdominal pain and vomiting.  Sometimes vomits twice a day.  Sometimes does not vomit at all.  But has a lot of nausea.  Patient's been having difficulty even though she has a good appetite when she eats food that it comes back up.  Early on in the illness for couple days there was some diarrhea.  Patient denies any fever denies any upper respiratory symptoms.  Approximately about a year ago patient had a big work-up with concerns for some gallbladder problems.  That had some vomiting associated with it.  And some pain.  Patient had HIDA scan upper endoscopy ultrasound gallbladder without any specific findings.  Was seen by Dr. Buford Dresser at that time.  Also had a colonoscopy in 2017.  Upper endoscopy was 2018.  So actually patient symptoms were probably during that timeframe.  So more than a year ago.  Patient's past medical history is significant for radioactive seed guided excisional breast biopsy in 2018.  This was bilateral.        Past Medical History:  Diagnosis Date  . Adenomatous colon polyp   . Anxiety   . HTN (hypertension)   . Hyperlipidemia   . Hypothyroidism     Patient Active Problem List   Diagnosis Date Noted  . SBO (small bowel obstruction) ([place]) 07/20/2019  . Breast cancer ([place]) 07/20/2019  . Abdominal pain, epigastric 08/05/2016  . Nausea with vomiting 08/05/2016  . Abnormal weight loss 08/05/2016  . Hx of adenomatous colonic polyps 09/22/2010    Past Surgical History:  Procedure Laterality Date  . ABDOMINAL HYSTERECTOMY    . BIOPSY  08/14/2016   Procedure: BIOPSY;  Surgeon: Daneil Dolin, MD;  [facility];  Service: Endoscopy;;  gastric  . BREAST EXCISIONAL BIOPSY Left 10/30/2016  .  BREAST EXCISIONAL BIOPSY Right 10/30/2016  . COLONOSCOPY  April 2007   Adenomatous polyp, pedunculated, at 30 cm. Scattered left-sided diverticula  . COLONOSCOPY N/A 01/17/2016   Dr. Gala Romney: 5 mm polyp in the cecum, tubular adenoma. Scattered small and large mouth diverticula in the sigmoid colon. Next colonoscopy 5 years.  . ESOPHAGOGASTRODUODENOSCOPY N/A 08/14/2016   Procedure: ESOPHAGOGASTRODUODENOSCOPY (EGD);  Surgeon: Daneil Dolin, MD;  [facility];  Service: Endoscopy;  Laterality: N/A;  215  . hysterectomy     complete  . RADIOACTIVE SEED GUIDED EXCISIONAL BREAST BIOPSY Bilateral 11/02/2016   Procedure: BILATERAL RADIOACTIVE SEED GUIDED EXCISIONAL BREAST BIOPSY LEFT BREAST 2 SEEDS RIGHT BREAST 1 SEED;  Surgeon: Rolm Bookbinder, MD;  Location: [place];  Service: General;  Laterality: Bilateral;  . TONSILLECTOMY       OB History    Gravida      Para      Term      Preterm      AB      Living  2     SAB      TAB      Ectopic      Multiple      Live Births              Family History  Problem Relation Age of Onset  . Colon polyps Mother   .  Breast cancer Mother 18  . Heart attack Father 29       deceased  . Arthritis Sister   . Liver disease Neg Hx     Social History   Tobacco Use  . Smoking status: Never Smoker  . Smokeless tobacco: Never Used  Substance Use Topics  . Alcohol use: No  . Drug use: No    Home Medications Prior to Admission medications   Medication Sig Start Date End Date Taking? Authorizing Provider  atorvastatin (LIPITOR) 20 MG tablet Take 20 mg by mouth at bedtime.    Yes [provider]  diphenoxylate-atropine (LOMOTIL) 2.5-0.025 MG tablet Take 1 tablet by mouth every 4 (four) hours as needed. 07/11/19  Yes [provider]  hydrochlorothiazide (HYDRODIURIL) 25 MG tablet Take 25 mg by mouth daily. 06/03/19  Yes [provider]  levothyroxine (SYNTHROID, LEVOTHROID) 75 MCG  tablet Take 75 mcg by mouth daily before breakfast.    Yes [provider]  LORazepam (ATIVAN) 1 MG tablet Take 1 mg by mouth daily as needed for anxiety.    Yes [provider]  losartan (COZAAR) 100 MG tablet Take 100 mg by mouth daily. 06/03/19  Yes [provider]  zolpidem (AMBIEN) 10 MG tablet Take 10 mg by mouth at bedtime as needed for sleep.   Yes [provider]    Allergies    Patient has no known allergies.  Review of Systems   Review of Systems  Constitutional: Positive for appetite change. Negative for chills and fever.  HENT: Negative for congestion, rhinorrhea and sore throat.   Eyes: Negative for visual disturbance.  Respiratory: Negative for cough and shortness of breath.   Cardiovascular: Negative for chest pain and leg swelling.  Gastrointestinal: Positive for abdominal pain, nausea and vomiting. Negative for diarrhea.  Genitourinary: Negative for dysuria.  Musculoskeletal: Negative for back pain and neck pain.  Skin: Negative for rash.  Neurological: Negative for dizziness, light-headedness and headaches.  Hematological: Does not bruise/bleed easily.  Psychiatric/Behavioral: Negative for confusion.    Physical Exam Updated Vital Signs BP 137/83   Pulse 80   Temp (!) 97.3 F (36.3 C) (Oral)   Resp 17   Ht 1.6 m (5\' 3" )   Wt 81.6 kg   SpO2 97%   BMI 31.89 kg/m   Physical Exam Vitals and nursing note reviewed.  Constitutional:      General: She is not in acute distress.    Appearance: Normal appearance. She is well-developed.  HENT:     Head: Normocephalic and atraumatic.  Eyes:     Extraocular Movements: Extraocular movements intact.     Conjunctiva/sclera: Conjunctivae normal.     Pupils: Pupils are equal, round, and reactive to light.  Cardiovascular:     Rate and Rhythm: Normal rate and regular rhythm.     Heart sounds: No murmur.  Pulmonary:     Effort: Pulmonary effort is normal. No respiratory distress.      Breath sounds: Normal breath sounds.  Abdominal:     General: There is no distension.     Palpations: Abdomen is soft. There is no mass.     Tenderness: There is no abdominal tenderness. There is no guarding.  Musculoskeletal:        General: Normal range of motion.     Cervical back: Normal range of motion and neck supple.  Skin:    General: Skin is warm and dry.  Neurological:     General: No focal  deficit present.     Mental Status: She is alert and oriented to person, place, and time.     ED Results / Procedures / Treatments   Labs (all labs ordered are listed, but only abnormal results are displayed) Labs Reviewed  COMPREHENSIVE METABOLIC PANEL - Abnormal; Notable for the following components:      Result Value   Potassium 3.0 (*)    Glucose, Bld 104 (*)    Creatinine, Ser 1.02 (*)    GFR calc non Af Amer 56 (*)    All other components within normal limits  SARS CORONAVIRUS 2 (TAT 6-24 HRS)  LIPASE, BLOOD  CBC  URINALYSIS, ROUTINE W REFLEX MICROSCOPIC    EKG None  Radiology CT Abdomen Pelvis W Contrast  Result Date: 07/20/2019 CLINICAL DATA:  Abdominal pain emesis for 5 weeks EXAM: CT ABDOMEN AND PELVIS WITH CONTRAST TECHNIQUE: Multidetector CT imaging of the abdomen and pelvis was performed using the standard protocol following bolus administration of intravenous contrast. CONTRAST:  136mL OMNIPAQUE IOHEXOL 300 MG/ML  SOLN COMPARISON:  HIDA scan 07/27/2016 FINDINGS: Lower chest: Lung bases are clear. Normal heart size. No pericardial effusion. Hepatobiliary: No focal liver abnormality is seen. No gallstones, gallbladder wall thickening, or biliary dilatation. Pancreas: Unremarkable. No pancreatic ductal dilatation or surrounding inflammatory changes. Spleen: Normal in size without focal abnormality. Adrenals/Urinary Tract: Kidneys are somewhat diminutive. Few parapelvic cysts are noted for instance a 1.6 cm cyst in the interpolar left kidney (2/34). No suspicious renal  lesions. No urolithiasis or hydronephrosis. Urinary bladder is unremarkable. Stomach/Bowel: Distal esophagus and stomach are free of acute abnormality. Duodenum takes a normal sweep across the abdomen. Small air and fluid-filled duodenal diverticular noted about the level distal abrupt transition at the level of the ligament of Treitz (2/36) and just distally associated with the proximal jejunum (2/29). There is a long segment of mid to distal small bowel demonstrating air and fluid distention. A proximal transition point is not clearly identified however there is focal distal transition at the level of a hyperenhancing nodule with in the low midline abdomen (2/72). Small bowel distal to this focus is largely decompressed. A geometric radiodensity within the distended small bowel segment (2/44) may reflect a medication or other ingested material. Cecum is slightly displaced towards the midline abdomen. Appendix is not well seen. No colonic dilatation or wall thickening. Vascular/Lymphatic: The aorta is normal caliber. No suspicious or enlarged lymph nodes in the included lymphatic chains. Reproductive: Uterus is surgically absent. No concerning adnexal lesions. Other: No free fluid. No free air. No organized abscess or collection. No bowel containing hernia. Musculoskeletal: Multilevel degenerative changes are present in the imaged portions of the spine. No acute osseous abnormality or suspicious osseous lesion. Mild dextrocurvature of the spine with an apex at the thoracolumbar junction. IMPRESSION: Long segment of mid to distal small bowel demonstrating air and fluid distention with focal distal transition point at the level of a hyperenhancing 0.7 x 1.5 cm nodule in the low midline abdomen (2/71). Findings are concerning for at least partial small bowel obstruction. The appearance of this lesion is worrisome for possible malignancy either primary or metastatic. Electronically Signed   By: Lovena Le M.D.   On:  07/20/2019 22:50    Procedures Procedures (including critical care time)  Medications Ordered in ED Medications  sodium chloride flush (NS) 0.9 % injection 3 mL (3 mLs Intravenous Not Given 07/20/19 2237)  0.9 %  sodium chloride infusion ( Intravenous New Bag/Given 07/20/19 2237)  iohexol (OMNIPAQUE) 300 MG/ML solution 100 mL (100 mLs Intravenous Contrast Given 07/20/19 2216)    ED Course  I have reviewed the triage vital signs and the nursing notes.  Pertinent labs & imaging results that were available during my care of the patient were reviewed by me and considered in my medical decision making (see chart for details).    MDM Rules/Calculators/A&P                      Patient's labs reassuring other than some mild hypokalemia.  But CT scan raising concerns for small bowel obstruction in the ileum area.  May be an enhancing lesion as well.  It is partial in nature.  Patient has not had multiple episodes of vomiting but overall symptoms ongoing for about 5 weeks.  Some days will vomit twice other days will not vomit at all.  Early on had some diarrhea for a couple days.  Has not had any of that recently.  Discussed with Dr. Arnoldo Morale from general surgery.  Will see her in consultation.  Discussed with the hospitalist who will admit her.  Patient nontoxic no acute distress.  No leukocytosis.  No anemia.  Final Clinical Impression(s) / ED Diagnoses Final diagnoses:  Other partial intestinal obstruction ([place])  SBO (small bowel obstruction) ([place])    Rx / DC Orders ED Discharge Orders    None       Fredia Sorrow, MD 07/20/19 2330

## 2019-07-21 ENCOUNTER — Encounter (HOSPITAL_COMMUNITY): Payer: Self-pay | Admitting: Internal Medicine

## 2019-07-21 DIAGNOSIS — Z90710 Acquired absence of both cervix and uterus: Secondary | ICD-10-CM | POA: Diagnosis not present

## 2019-07-21 DIAGNOSIS — Z86010 Personal history of colonic polyps: Secondary | ICD-10-CM | POA: Diagnosis not present

## 2019-07-21 DIAGNOSIS — K56609 Unspecified intestinal obstruction, unspecified as to partial versus complete obstruction: Secondary | ICD-10-CM

## 2019-07-21 DIAGNOSIS — C7A8 Other malignant neuroendocrine tumors: Secondary | ICD-10-CM | POA: Diagnosis not present

## 2019-07-21 DIAGNOSIS — F419 Anxiety disorder, unspecified: Secondary | ICD-10-CM | POA: Diagnosis present

## 2019-07-21 DIAGNOSIS — Z79890 Hormone replacement therapy (postmenopausal): Secondary | ICD-10-CM | POA: Diagnosis not present

## 2019-07-21 DIAGNOSIS — Z8261 Family history of arthritis: Secondary | ICD-10-CM | POA: Diagnosis not present

## 2019-07-21 DIAGNOSIS — Z79899 Other long term (current) drug therapy: Secondary | ICD-10-CM | POA: Diagnosis not present

## 2019-07-21 DIAGNOSIS — Z20822 Contact with and (suspected) exposure to covid-19: Secondary | ICD-10-CM | POA: Diagnosis present

## 2019-07-21 DIAGNOSIS — G4700 Insomnia, unspecified: Secondary | ICD-10-CM | POA: Diagnosis present

## 2019-07-21 DIAGNOSIS — E039 Hypothyroidism, unspecified: Secondary | ICD-10-CM | POA: Diagnosis present

## 2019-07-21 DIAGNOSIS — Z803 Family history of malignant neoplasm of breast: Secondary | ICD-10-CM | POA: Diagnosis not present

## 2019-07-21 DIAGNOSIS — K56600 Partial intestinal obstruction, unspecified as to cause: Secondary | ICD-10-CM | POA: Diagnosis not present

## 2019-07-21 DIAGNOSIS — C7A019 Malignant carcinoid tumor of the small intestine, unspecified portion: Secondary | ICD-10-CM | POA: Diagnosis present

## 2019-07-21 DIAGNOSIS — Z853 Personal history of malignant neoplasm of breast: Secondary | ICD-10-CM | POA: Diagnosis not present

## 2019-07-21 DIAGNOSIS — K660 Peritoneal adhesions (postprocedural) (postinfection): Secondary | ICD-10-CM | POA: Diagnosis present

## 2019-07-21 DIAGNOSIS — E785 Hyperlipidemia, unspecified: Secondary | ICD-10-CM | POA: Diagnosis present

## 2019-07-21 DIAGNOSIS — I10 Essential (primary) hypertension: Secondary | ICD-10-CM | POA: Diagnosis present

## 2019-07-21 DIAGNOSIS — Z8371 Family history of colonic polyps: Secondary | ICD-10-CM | POA: Diagnosis not present

## 2019-07-21 DIAGNOSIS — Z01818 Encounter for other preprocedural examination: Secondary | ICD-10-CM | POA: Diagnosis not present

## 2019-07-21 DIAGNOSIS — Z8249 Family history of ischemic heart disease and other diseases of the circulatory system: Secondary | ICD-10-CM | POA: Diagnosis not present

## 2019-07-21 DIAGNOSIS — E876 Hypokalemia: Secondary | ICD-10-CM | POA: Diagnosis not present

## 2019-07-21 DIAGNOSIS — K5750 Diverticulosis of both small and large intestine without perforation or abscess without bleeding: Secondary | ICD-10-CM | POA: Diagnosis present

## 2019-07-21 LAB — CBC
HCT: 40.7 % (ref 36.0–46.0)
Hemoglobin: 13.6 g/dL (ref 12.0–15.0)
MCH: 31.1 pg (ref 26.0–34.0)
MCHC: 33.4 g/dL (ref 30.0–36.0)
MCV: 92.9 fL (ref 80.0–100.0)
Platelets: 216 10*3/uL (ref 150–400)
RBC: 4.38 MIL/uL (ref 3.87–5.11)
RDW: 12.7 % (ref 11.5–15.5)
WBC: 10.5 10*3/uL (ref 4.0–10.5)
nRBC: 0 % (ref 0.0–0.2)

## 2019-07-21 LAB — BASIC METABOLIC PANEL
Anion gap: 12 (ref 5–15)
BUN: 15 mg/dL (ref 8–23)
CO2: 24 mmol/L (ref 22–32)
Calcium: 8.8 mg/dL — ABNORMAL LOW (ref 8.9–10.3)
Chloride: 103 mmol/L (ref 98–111)
Creatinine, Ser: 0.85 mg/dL (ref 0.44–1.00)
GFR calc Af Amer: >60 mL/min (ref >60)
GFR calc non Af Amer: >60 mL/min (ref >60)
Glucose, Bld: 79 mg/dL (ref 70–99)
Potassium: 2.9 mmol/L — ABNORMAL LOW (ref 3.5–5.1)
Sodium: 139 mmol/L (ref 135–145)

## 2019-07-21 LAB — SARS CORONAVIRUS 2 (TAT 6-24 HRS): SARS Coronavirus 2: NEGATIVE

## 2019-07-21 LAB — TSH: TSH: 0.626 u[IU]/mL (ref 0.350–4.500)

## 2019-07-21 LAB — HIV ANTIBODY (ROUTINE TESTING W REFLEX): HIV Screen 4th Generation wRfx: NONREACTIVE

## 2019-07-21 MED ORDER — ZOLPIDEM TARTRATE 5 MG PO TABS
5.0000 mg | ORAL_TABLET | Freq: Every evening | ORAL | Status: DC | PRN
  Administered 2019-07-21: 02:00:00 5 mg via ORAL
  Filled 2019-07-21: qty 1

## 2019-07-21 MED ORDER — HYDROCHLOROTHIAZIDE 25 MG PO TABS
25.0000 mg | ORAL_TABLET | Freq: Every day | ORAL | Status: DC
  Administered 2019-07-21: 25 mg via ORAL
  Filled 2019-07-21: qty 1

## 2019-07-21 MED ORDER — MAGNESIUM SULFATE 2 GM/50ML IV SOLN
2.0000 g | Freq: Once | INTRAVENOUS | Status: AC
  Administered 2019-07-21: 2 g via INTRAVENOUS
  Filled 2019-07-21: qty 50

## 2019-07-21 MED ORDER — SODIUM CHLORIDE 0.9 % IV SOLN: 250.0000 mL | INTRAVENOUS | Status: DC | PRN

## 2019-07-21 MED ORDER — LACTATED RINGERS IV SOLN: INTRAVENOUS | Status: DC

## 2019-07-21 MED ORDER — ENOXAPARIN SODIUM 40 MG/0.4ML ~~LOC~~ SOLN
40.0000 mg | SUBCUTANEOUS | Status: DC
  Administered 2019-07-21 – 2019-07-23 (×3): 40 mg via SUBCUTANEOUS
  Filled 2019-07-21 (×3): qty 0.4

## 2019-07-21 MED ORDER — ONDANSETRON HCL 4 MG PO TABS
4.0000 mg | ORAL_TABLET | Freq: Four times a day (QID) | ORAL | Status: DC | PRN
  Administered 2019-07-22: 4 mg via ORAL
  Filled 2019-07-21: qty 1

## 2019-07-21 MED ORDER — BISACODYL 10 MG RE SUPP: 10.0000 mg | Freq: Every day | RECTAL | Status: DC | PRN

## 2019-07-21 MED ORDER — ONDANSETRON HCL 4 MG/2ML IJ SOLN
4.0000 mg | Freq: Four times a day (QID) | INTRAMUSCULAR | Status: DC | PRN
  Administered 2019-07-23 – 2019-07-24 (×3): 4 mg via INTRAVENOUS
  Filled 2019-07-21 (×3): qty 2

## 2019-07-21 MED ORDER — LEVOTHYROXINE SODIUM 75 MCG PO TABS
75.0000 ug | ORAL_TABLET | Freq: Every day | ORAL | Status: DC
  Administered 2019-07-21 – 2019-07-27 (×7): 75 ug via ORAL
  Filled 2019-07-21: qty 2
  Filled 2019-07-21: qty 1
  Filled 2019-07-21 (×2): qty 2
  Filled 2019-07-21 (×2): qty 1
  Filled 2019-07-21: qty 2

## 2019-07-21 MED ORDER — SODIUM CHLORIDE 0.9% FLUSH
3.0000 mL | Freq: Two times a day (BID) | INTRAVENOUS | Status: DC
  Administered 2019-07-21 – 2019-07-26 (×9): 3 mL via INTRAVENOUS

## 2019-07-21 MED ORDER — SODIUM CHLORIDE 0.9% FLUSH: 3.0000 mL | INTRAVENOUS | Status: DC | PRN

## 2019-07-21 MED ORDER — MORPHINE SULFATE (PF) 2 MG/ML IV SOLN
2.0000 mg | INTRAVENOUS | Status: DC | PRN
  Administered 2019-07-23 – 2019-07-24 (×6): 2 mg via INTRAVENOUS
  Filled 2019-07-21 (×6): qty 1

## 2019-07-21 MED ORDER — AMLODIPINE BESYLATE 5 MG PO TABS
5.0000 mg | ORAL_TABLET | Freq: Every day | ORAL | Status: DC
  Administered 2019-07-21 – 2019-07-27 (×6): 5 mg via ORAL
  Filled 2019-07-21 (×7): qty 1

## 2019-07-21 MED ORDER — KCL IN DEXTROSE-NACL 20-5-0.45 MEQ/L-%-% IV SOLN: INTRAVENOUS | Status: DC

## 2019-07-21 MED ORDER — LOSARTAN POTASSIUM 25 MG PO TABS
100.0000 mg | ORAL_TABLET | Freq: Every day | ORAL | Status: DC
  Administered 2019-07-21: 100 mg via ORAL
  Filled 2019-07-21: qty 4

## 2019-07-21 MED ORDER — MORPHINE SULFATE (PF) 2 MG/ML IV SOLN: 1.0000 mg | INTRAVENOUS | Status: DC | PRN

## 2019-07-21 MED ORDER — ATORVASTATIN CALCIUM 20 MG PO TABS
20.0000 mg | ORAL_TABLET | Freq: Every day | ORAL | Status: DC
  Administered 2019-07-21 – 2019-07-26 (×6): 20 mg via ORAL
  Filled 2019-07-21 (×2): qty 2
  Filled 2019-07-21 (×3): qty 1
  Filled 2019-07-21: qty 2

## 2019-07-21 MED ORDER — ACETAMINOPHEN 325 MG PO TABS: 650.0000 mg | ORAL_TABLET | Freq: Four times a day (QID) | ORAL | Status: DC | PRN

## 2019-07-21 MED ORDER — LORAZEPAM 1 MG PO TABS
1.0000 mg | ORAL_TABLET | Freq: Every day | ORAL | Status: DC | PRN
  Administered 2019-07-21 – 2019-07-24 (×3): 1 mg via ORAL
  Filled 2019-07-21 (×3): qty 1

## 2019-07-21 MED ORDER — ACETAMINOPHEN 650 MG RE SUPP: 650.0000 mg | Freq: Four times a day (QID) | RECTAL | Status: DC | PRN

## 2019-07-21 MED ORDER — ZOLPIDEM TARTRATE 5 MG PO TABS
5.0000 mg | ORAL_TABLET | Freq: Every day | ORAL | Status: DC
  Administered 2019-07-21 – 2019-07-26 (×6): 5 mg via ORAL
  Filled 2019-07-21 (×6): qty 1

## 2019-07-21 MED ORDER — KCL IN DEXTROSE-NACL 40-5-0.45 MEQ/L-%-% IV SOLN: INTRAVENOUS | Status: DC

## 2019-07-21 NOTE — Progress Notes (Signed)
Patient Demographics:    Hailey Randolph, is a 69 y.o. female, DOB - 11/19/1950, [id number]  Admit date - 07/20/2019   Admitting Physician Lynetta Mare, MD  Outpatient Primary MD for the patient is Asencion Noble, MD  LOS - 0   Chief Complaint  Patient presents with  . Abdominal Pain        Subjective:    Hailey Randolph today has no fevers, ,  No chest pain,   --Nausea but no emesis -Last BM on a 48 hours ago no significant abdominal pain at this time  Assessment  & Plan :    Principal Problem:   SBO (small bowel obstruction) (HCC) Active Problems:   Nausea with vomiting   Breast cancer [facility])  Brief Summary 69 y.o. 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 Arnoldo Morale appreciated -24 HIAA to assess for carcinoid disease, CEA levels pending -Further management per surgical team team -No emesis at this time, okay to have sips and chips   2)HTN-stop losartan HCTZ due to n.p.o. status with risk for dehydration and AKI  3) hypothyroidism--- TSH 0.6, continue with thyroxine  4) anxiety disorder/insomnia--- continue on Ambien and Ativan  5) hypokalemia--stop HCTZ, replaced orally and IV.  Give magnesium empirically  Disposition/Need for in-Hospital Stay- patient unable to be discharged at this time due to --SBO with electrolyte abnormalities requiring continuous IV fluid and IV replacements -Medically ready for discharge home due to inability to have oral intake, need for IV fluids  Code Status : Full code  Family Communication:   NA (patient is  alert, awake and coherent)  Consults  :  Gen surg  DVT Prophylaxis  :  Lovenox -  SCDs    Lab Results  Component Value Date   PLT 216 07/21/2019   Inpatient Medications  Scheduled Meds: . amLODipine  5 mg Oral Daily  . atorvastatin  20 mg Oral QHS  . enoxaparin (LOVENOX) injection  40 mg Subcutaneous Q24H  . levothyroxine  75 mcg Oral QAC breakfast  . sodium chloride flush  3 mL Intravenous Once  . sodium chloride flush  3 mL Intravenous Q12H  . zolpidem  5 mg Oral QHS   Continuous Infusions: . sodium chloride    . dextrose 5 % and 0.45 % NaCl with KCl 20 mEq/L 125 mL/hr at 07/21/19 1010   PRN Meds:.sodium chloride, acetaminophen **OR** acetaminophen, bisacodyl, LORazepam, morphine injection, ondansetron **OR** ondansetron (ZOFRAN) IV, sodium chloride flush   Anti-infectives (From admission, onward)   None        Objective:   Vitals:   07/21/19 0130 07/21/19 0600 07/21/19 1005 07/21/19 1314  BP: 140/77 118/68 121/67 108/64  Pulse: 68 64 70 64  Resp: 18 18  18   Temp: 98 F (36.7 C) 97.9 F (36.6 C)  98.5 F (36.9 C)  TempSrc: Oral Oral  Oral  SpO2: 98% 98%  95%  Weight: 81.9 kg     Height: 5\' 3"  (1.6 m)  Wt Readings from Last 3 Encounters:  07/21/19 81.9 kg  11/02/16 80.3 kg  08/14/16 80.3 kg     Intake/Output Summary (Last 24 hours) at 07/21/2019 1815 Last data filed at 07/21/2019 1612 Gross per 24 hour  Intake 1140.7 ml  Output 500 ml  Net 640.7 ml   Physical Exam  Gen:- Awake Alert,  In no apparent distress  HEENT:- [place].AT, No sclera icterus Neck-Supple Neck,No JVD,.  Lungs-  CTAB , fair symmetrical air movement CV- S1, S2 normal, regular  Abd-  +ve B.Sounds, Abd Soft, No tenderness,    Extremity/Skin:- No  edema, pedal pulses present  Psych-affect is appropriate, oriented x3 Neuro-no new focal deficits, no tremors   Data Review:   Micro Results Recent Results (from the past 240 hour(s))  SARS CORONAVIRUS 2 (TAT 6-24 HRS)  Nasopharyngeal Nasopharyngeal Swab     Status: None   Collection Time: 07/20/19 10:46 PM   Specimen: Nasopharyngeal Swab  Result Value Ref Range Status   SARS Coronavirus 2 NEGATIVE NEGATIVE Final    Comment: (NOTE) SARS-CoV-2 target nucleic acids are NOT DETECTED. The SARS-CoV-2 RNA is generally detectable in upper and lower respiratory specimens during the acute phase of infection. Negative results do not preclude SARS-CoV-2 infection, do not rule out co-infections with other pathogens, and should not be used as the sole basis for treatment or other patient management decisions. Negative results must be combined with clinical observations, patient history, and epidemiological information. The expected result is Negative. Fact Sheet for Patients: SugarRoll.be Fact Sheet for Healthcare Providers: https://www.woods-mathews.com/ This test is not yet approved or cleared by the [place] FDA and  has been authorized for detection and/or diagnosis of SARS-CoV-2 by FDA under an Emergency Use Authorization (EUA). This EUA will remain  in effect (meaning this test can be used) for the duration of the COVID-19 declaration under Section 56 4(b)(1) of the Act, 21 U.S.C. section 360bbb-3(b)(1), unless the authorization is terminated or revoked sooner. Performed at [facility], [place] [address], [place], [place] [address]     Radiology Reports CT Abdomen Pelvis W Contrast  Result Date: 07/20/2019 CLINICAL DATA:  Abdominal pain emesis for 5 weeks EXAM: CT ABDOMEN AND PELVIS WITH CONTRAST TECHNIQUE: Multidetector CT imaging of the abdomen and pelvis was performed using the standard protocol following bolus administration of intravenous contrast. CONTRAST:  164mL OMNIPAQUE IOHEXOL 300 MG/ML  SOLN COMPARISON:  HIDA scan 07/27/2016 FINDINGS: Lower chest: Lung bases are clear. Normal heart size. No pericardial effusion. Hepatobiliary: No focal liver  abnormality is seen. No gallstones, gallbladder wall thickening, or biliary dilatation. Pancreas: Unremarkable. No pancreatic ductal dilatation or surrounding inflammatory changes. Spleen: Normal in size without focal abnormality. Adrenals/Urinary Tract: Kidneys are somewhat diminutive. Few parapelvic cysts are noted for instance a 1.6 cm cyst in the interpolar left kidney (2/34). No suspicious renal lesions. No urolithiasis or hydronephrosis. Urinary bladder is unremarkable. Stomach/Bowel: Distal esophagus and stomach are free of acute abnormality. Duodenum takes a normal sweep across the abdomen. Small air and fluid-filled duodenal diverticular noted about the level distal abrupt transition at the level of the ligament of Treitz (2/36) and just distally associated with the proximal jejunum (2/29). There is a long segment of mid to distal small bowel demonstrating air and fluid distention. A proximal transition point is not clearly identified however there is focal distal transition at the level of a hyperenhancing nodule with in the low midline abdomen (2/72). Small bowel distal to this focus is largely decompressed.  A geometric radiodensity within the distended small bowel segment (2/44) may reflect a medication or other ingested material. Cecum is slightly displaced towards the midline abdomen. Appendix is not well seen. No colonic dilatation or wall thickening. Vascular/Lymphatic: The aorta is normal caliber. No suspicious or enlarged lymph nodes in the included lymphatic chains. Reproductive: Uterus is surgically absent. No concerning adnexal lesions. Other: No free fluid. No free air. No organized abscess or collection. No bowel containing hernia. Musculoskeletal: Multilevel degenerative changes are present in the imaged portions of the spine. No acute osseous abnormality or suspicious osseous lesion. Mild dextrocurvature of the spine with an apex at the thoracolumbar junction. IMPRESSION: Long segment of mid  to distal small bowel demonstrating air and fluid distention with focal distal transition point at the level of a hyperenhancing 0.7 x 1.5 cm nodule in the low midline abdomen (2/71). Findings are concerning for at least partial small bowel obstruction. The appearance of this lesion is worrisome for possible malignancy either primary or metastatic. Electronically Signed   By: Lovena Le M.D.   On: 07/20/2019 22:50     CBC Recent Labs  Lab 07/20/19 2035 07/21/19 0503  WBC 9.0 10.5  HGB 14.3 13.6  HCT 42.2 40.7  PLT 287 216  MCV 93.8 92.9  MCH 31.8 31.1  MCHC 33.9 33.4  RDW 12.7 12.7    Chemistries  Recent Labs  Lab 07/20/19 2035 07/21/19 0503  NA 139 139  K 3.0* 2.9*  CL 101 103  CO2 26 24  GLUCOSE 104* 79  BUN 18 15  CREATININE 1.02* 0.85  CALCIUM 9.0 8.8*  AST 19  --   ALT 24  --   ALKPHOS 78  --   BILITOT 0.8  --    ------------------------------------------------------------------------------------------------------------------ No results for input(s): CHOL, HDL, LDLCALC, TRIG, CHOLHDL, LDLDIRECT in the last 72 hours.  No results found for: HGBA1C ------------------------------------------------------------------------------------------------------------------ Recent Labs    07/21/19 0539  TSH 0.626   ------------------------------------------------------------------------------------------------------------------ No results for input(s): VITAMINB12, FOLATE, FERRITIN, TIBC, IRON, RETICCTPCT in the last 72 hours.  Coagulation profile No results for input(s): INR, PROTIME in the last 168 hours.  No results for input(s): DDIMER in the last 72 hours.  Cardiac Enzymes No results for input(s): CKMB, TROPONINI, MYOGLOBIN in the last 168 hours.  Invalid input(s): CK ------------------------------------------------------------------------------------------------------------------ No results found for: BNP   Roxan Hockey M.D on 07/21/2019 at 6:15 PM  Go to  [email or web address] - for contact info  [facility] - Office  [phone]

## 2019-07-21 NOTE — Plan of Care (Signed)
  Problem: Elimination: Goal: Will not experience complications related to urinary retention Outcome: Progressing   Problem: Safety: Goal: Ability to remain free from injury will improve Outcome: Progressing   Problem: Elimination: Goal: Will not experience complications related to bowel motility Outcome: Not Progressing

## 2019-07-21 NOTE — Consult Note (Signed)
Reason for Consult: Partial small bowel obstruction, possible small bowel neoplasm Referring Physician: Dr. Minerva Randolph is an 69 y.o. female.  HPI: Patient is a 69 year old white female with a several month history of intermittent crampy abdominal pain, nausea, vomiting, diarrhea.  She states it comes and goes.  It is not necessarily associated with eating.  She presented to the emergency room for further evaluation and treatment.  CT scan of the abdomen reveals a partial small bowel obstruction with a possible small bowel neoplasm in the distal small bowel.  Patient had a colonoscopy in the last 3 to 4 years which was for the most part unremarkable.  She denies any significant weight loss.  She denies any blood in her stools.  She currently has 3 out of 10 abdominal pain.  It is intermittent in nature.  She last had a bowel movement 2 days ago.  Past Medical History:  Diagnosis Date  . Adenomatous colon polyp   . Anxiety   . HTN (hypertension)   . Hyperlipidemia   . Hypothyroidism     Past Surgical History:  Procedure Laterality Date  . ABDOMINAL HYSTERECTOMY    . BIOPSY  08/14/2016   Procedure: BIOPSY;  Surgeon: Daneil Dolin, MD;  [facility];  Service: Endoscopy;;  gastric  . BREAST EXCISIONAL BIOPSY Left 10/30/2016  . BREAST EXCISIONAL BIOPSY Right 10/30/2016  . COLONOSCOPY  April 2007   Adenomatous polyp, pedunculated, at 30 cm. Scattered left-sided diverticula  . COLONOSCOPY N/A 01/17/2016   Dr. Gala Romney: 5 mm polyp in the cecum, tubular adenoma. Scattered small and large mouth diverticula in the sigmoid colon. Next colonoscopy 5 years.  . ESOPHAGOGASTRODUODENOSCOPY N/A 08/14/2016   Procedure: ESOPHAGOGASTRODUODENOSCOPY (EGD);  Surgeon: Daneil Dolin, MD;  [facility];  Service: Endoscopy;  Laterality: N/A;  215  . hysterectomy     complete  . RADIOACTIVE SEED GUIDED EXCISIONAL BREAST BIOPSY Bilateral 11/02/2016   Procedure: BILATERAL RADIOACTIVE  SEED GUIDED EXCISIONAL BREAST BIOPSY LEFT BREAST 2 SEEDS RIGHT BREAST 1 SEED;  Surgeon: Rolm Bookbinder, MD;  Location: [place];  Service: General;  Laterality: Bilateral;  . TONSILLECTOMY      Family History  Problem Relation Age of Onset  . Colon polyps Mother   . Breast cancer Mother 48  . Heart attack Father 9       deceased  . Arthritis Sister   . Liver disease Neg Hx     Social History:  reports that she has never smoked. She has never used smokeless tobacco. She reports that she does not drink alcohol or use drugs.  Allergies: No Known Allergies  Medications:  I have reviewed the patient's current medications. Prior to Admission:  Medications Prior to Admission  Medication Sig Dispense Refill Last Dose  . atorvastatin (LIPITOR) 20 MG tablet Take 20 mg by mouth at bedtime.    07/19/2019 at Unknown time  . diphenoxylate-atropine (LOMOTIL) 2.5-0.025 MG tablet Take 1 tablet by mouth every 4 (four) hours as needed.   unknown  . hydrochlorothiazide (HYDRODIURIL) 25 MG tablet Take 25 mg by mouth daily.   07/20/2019 at Unknown time  . levothyroxine (SYNTHROID, LEVOTHROID) 75 MCG tablet Take 75 mcg by mouth daily before breakfast.    07/20/2019 at Unknown time  . LORazepam (ATIVAN) 1 MG tablet Take 1 mg by mouth daily as needed for anxiety.    07/20/2019 at Unknown time  . losartan (COZAAR) 100 MG tablet Take 100  mg by mouth daily.   07/20/2019 at Unknown time  . zolpidem (AMBIEN) 10 MG tablet Take 10 mg by mouth at bedtime as needed for sleep.   Past Week at Unknown time    Results for orders placed or performed during the hospital encounter of 07/20/19 (from the past 48 hour(s))  Lipase, blood     Status: None   Collection Time: 07/20/19  8:35 PM  Result Value Ref Range   Lipase 16 11 - 51 U/L    Comment: Performed at [facility], [address], [place], [place] [address]  Comprehensive metabolic panel     Status: Abnormal   Collection Time: 07/20/19  8:35 PM   Result Value Ref Range   Sodium 139 135 - 145 mmol/L   Potassium 3.0 (L) 3.5 - 5.1 mmol/L   Chloride 101 98 - 111 mmol/L   CO2 26 22 - 32 mmol/L   Glucose, Bld 104 (H) 70 - 99 mg/dL    Comment: Glucose reference range applies only to samples taken after fasting for at least 8 hours.   BUN 18 8 - 23 mg/dL   Creatinine, Ser 1.02 (H) 0.44 - 1.00 mg/dL   Calcium 9.0 8.9 - 10.3 mg/dL   Total Protein 6.5 6.5 - 8.1 g/dL   Albumin 3.7 3.5 - 5.0 g/dL   AST 19 15 - 41 U/L   ALT 24 0 - 44 U/L   Alkaline Phosphatase 78 38 - 126 U/L   Total Bilirubin 0.8 0.3 - 1.2 mg/dL   GFR calc non Af Amer 56 (L) >60 mL/min   GFR calc Af Amer >60 >60 mL/min   Anion gap 12 5 - 15    Comment: Performed at [facility], [address], [place], [place] [address]  CBC     Status: None   Collection Time: 07/20/19  8:35 PM  Result Value Ref Range   WBC 9.0 4.0 - 10.5 K/uL   RBC 4.50 3.87 - 5.11 MIL/uL   Hemoglobin 14.3 12.0 - 15.0 g/dL   HCT 42.2 36.0 - 46.0 %   MCV 93.8 80.0 - 100.0 fL   MCH 31.8 26.0 - 34.0 pg   MCHC 33.9 30.0 - 36.0 g/dL   RDW 12.7 11.5 - 15.5 %   Platelets 287 150 - 400 K/uL   nRBC 0.0 0.0 - 0.2 %    Comment: Performed at [facility], [address], [place], [place] [id number]  Basic metabolic panel     Status: Abnormal   Collection Time: 07/21/19  5:03 AM  Result Value Ref Range   Sodium 139 135 - 145 mmol/L   Potassium 2.9 (L) 3.5 - 5.1 mmol/L   Chloride 103 98 - 111 mmol/L   CO2 24 22 - 32 mmol/L   Glucose, Bld 79 70 - 99 mg/dL    Comment: Glucose reference range applies only to samples taken after fasting for at least 8 hours.   BUN 15 8 - 23 mg/dL   Creatinine, Ser 0.85 0.44 - 1.00 mg/dL   Calcium 8.8 (L) 8.9 - 10.3 mg/dL   GFR calc non Af Amer >60 >60 mL/min   GFR calc Af Amer >60 >60 mL/min   Anion gap 12 5 - 15    Comment: Performed at [facility], [address] [place], [place] [address]  CBC     Status: None   Collection Time: 07/21/19  5:03 AM  Result Value Ref  Range   WBC 10.5  4.0 - 10.5 K/uL   RBC 4.38 3.87 - 5.11 MIL/uL   Hemoglobin 13.6 12.0 - 15.0 g/dL   HCT 40.7 36.0 - 46.0 %   MCV 92.9 80.0 - 100.0 fL   MCH 31.1 26.0 - 34.0 pg   MCHC 33.4 30.0 - 36.0 g/dL   RDW 12.7 11.5 - 15.5 %   Platelets 216 150 - 400 K/uL   nRBC 0.0 0.0 - 0.2 %    Comment: Performed at [facility], [address], [place], [place] [address]  TSH     Status: None   Collection Time: 07/21/19  5:39 AM  Result Value Ref Range   TSH 0.626 0.350 - 4.500 uIU/mL    Comment: Performed by a 3rd Generation assay with a functional sensitivity of <=0.01 uIU/mL. Performed at [facility], [address], [place], [place] [address]     CT Abdomen Pelvis W Contrast  Result Date: 07/20/2019 CLINICAL DATA:  Abdominal pain emesis for 5 weeks EXAM: CT ABDOMEN AND PELVIS WITH CONTRAST TECHNIQUE: Multidetector CT imaging of the abdomen and pelvis was performed using the standard protocol following bolus administration of intravenous contrast. CONTRAST:  186mL OMNIPAQUE IOHEXOL 300 MG/ML  SOLN COMPARISON:  HIDA scan 07/27/2016 FINDINGS: Lower chest: Lung bases are clear. Normal heart size. No pericardial effusion. Hepatobiliary: No focal liver abnormality is seen. No gallstones, gallbladder wall thickening, or biliary dilatation. Pancreas: Unremarkable. No pancreatic ductal dilatation or surrounding inflammatory changes. Spleen: Normal in size without focal abnormality. Adrenals/Urinary Tract: Kidneys are somewhat diminutive. Few parapelvic cysts are noted for instance a 1.6 cm cyst in the interpolar left kidney (2/34). No suspicious renal lesions. No urolithiasis or hydronephrosis. Urinary bladder is unremarkable. Stomach/Bowel: Distal esophagus and stomach are free of acute abnormality. Duodenum takes a normal sweep across the abdomen. Small air and fluid-filled duodenal diverticular noted about the level distal abrupt transition at the level of the ligament of Treitz (2/36) and just distally  associated with the proximal jejunum (2/29). There is a long segment of mid to distal small bowel demonstrating air and fluid distention. A proximal transition point is not clearly identified however there is focal distal transition at the level of a hyperenhancing nodule with in the low midline abdomen (2/72). Small bowel distal to this focus is largely decompressed. A geometric radiodensity within the distended small bowel segment (2/44) may reflect a medication or other ingested material. Cecum is slightly displaced towards the midline abdomen. Appendix is not well seen. No colonic dilatation or wall thickening. Vascular/Lymphatic: The aorta is normal caliber. No suspicious or enlarged lymph nodes in the included lymphatic chains. Reproductive: Uterus is surgically absent. No concerning adnexal lesions. Other: No free fluid. No free air. No organized abscess or collection. No bowel containing hernia. Musculoskeletal: Multilevel degenerative changes are present in the imaged portions of the spine. No acute osseous abnormality or suspicious osseous lesion. Mild dextrocurvature of the spine with an apex at the thoracolumbar junction. IMPRESSION: Long segment of mid to distal small bowel demonstrating air and fluid distention with focal distal transition point at the level of a hyperenhancing 0.7 x 1.5 cm nodule in the low midline abdomen (2/71). Findings are concerning for at least partial small bowel obstruction. The appearance of this lesion is worrisome for possible malignancy either primary or metastatic. Electronically Signed   By: Lovena Le M.D.   On: 07/20/2019 22:50    ROS:  Pertinent items are noted in HPI.  Blood pressure 121/67, pulse 70, temperature 97.9  F (36.6 C), temperature source Oral, resp. rate 18, height 5\' 3"  (1.6 m), weight 81.9 kg, SpO2 98 %. Physical Exam: Pleasant white female no acute distress Head is normocephalic, atraumatic Lungs clear to auscultation with equal breath  sounds bilaterally Heart examination reveals regular rate and rhythm without S3, S4, murmurs At and is soft and not particularly distended.  No rigidity is noted.  She does have migratory discomfort to deep palpation present.  Occasional bowel sounds appreciated.  I cannot appreciate hepatosplenomegaly.  No hernias are present.  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  Will get 24-hour urine HIAA to assess for carcinoid disease.  CEA level is pending.  In all likelihood, patient will require exploratory laparotomy to fully address the findings on CT scan of the abdomen.  Patient made fully aware.  May be on sips of liquids as needed for dry mouth.  Hailey Randolph 07/21/2019, 11:14 AM

## 2019-07-21 NOTE — Care Management Important Message (Signed)
Important Message  Patient Details  Name: Hailey Randolph MRN: [id number] Date of Birth: 10-28-1950   Medicare Important Message Given:  Yes     Tommy Medal 07/21/2019, 2:32 PM

## 2019-07-21 NOTE — Progress Notes (Signed)
MEWS/VS Documentation      07/21/2019 0600 07/21/2019 0807 07/21/2019 1005 07/21/2019 1314   MEWS Score:  0  0  0  0   MEWS Score Color:  Green  Green  Green  Green   Resp:  18  -  -  18   Pulse:  64  -  70  64   BP:  118/68  -  121/67  108/64   Temp:  97.9 F (36.6 C)  -  -  98.5 F (36.9 C)   O2 Device:  Room Air  -  -  Room Air   Level of Consciousness:  Alert  Alert  -  -

## 2019-07-22 LAB — URINALYSIS, ROUTINE W REFLEX MICROSCOPIC
Bacteria, UA: NONE SEEN
Bilirubin Urine: NEGATIVE
Glucose, UA: NEGATIVE mg/dL
Hgb urine dipstick: NEGATIVE
Ketones, ur: NEGATIVE mg/dL
Nitrite: NEGATIVE
Protein, ur: 30 mg/dL — AB
Specific Gravity, Urine: 1.019 (ref 1.005–1.030)
pH: 6 (ref 5.0–8.0)

## 2019-07-22 LAB — RENAL FUNCTION PANEL
Albumin: 3.3 g/dL — ABNORMAL LOW (ref 3.5–5.0)
Anion gap: 8 (ref 5–15)
BUN: 9 mg/dL (ref 8–23)
CO2: 25 mmol/L (ref 22–32)
Calcium: 8.6 mg/dL — ABNORMAL LOW (ref 8.9–10.3)
Chloride: 106 mmol/L (ref 98–111)
Creatinine, Ser: 0.75 mg/dL (ref 0.44–1.00)
GFR calc Af Amer: >60 mL/min (ref >60)
GFR calc non Af Amer: >60 mL/min (ref >60)
Glucose, Bld: 111 mg/dL — ABNORMAL HIGH (ref 70–99)
Phosphorus: 3.1 mg/dL (ref 2.5–4.6)
Potassium: 3.1 mmol/L — ABNORMAL LOW (ref 3.5–5.1)
Sodium: 139 mmol/L (ref 135–145)

## 2019-07-22 LAB — CBC
HCT: 39.4 % (ref 36.0–46.0)
Hemoglobin: 13.1 g/dL (ref 12.0–15.0)
MCH: 31 pg (ref 26.0–34.0)
MCHC: 33.2 g/dL (ref 30.0–36.0)
MCV: 93.4 fL (ref 80.0–100.0)
Platelets: 287 10*3/uL (ref 150–400)
RBC: 4.22 MIL/uL (ref 3.87–5.11)
RDW: 12.6 % (ref 11.5–15.5)
WBC: 8 10*3/uL (ref 4.0–10.5)
nRBC: 0 % (ref 0.0–0.2)

## 2019-07-22 MED ORDER — POTASSIUM CHLORIDE CRYS ER 20 MEQ PO TBCR
40.0000 meq | EXTENDED_RELEASE_TABLET | ORAL | Status: AC
  Administered 2019-07-22 (×2): 40 meq via ORAL
  Filled 2019-07-22: qty 2
  Filled 2019-07-22: qty 4

## 2019-07-22 NOTE — Progress Notes (Signed)
Initial Nutrition Assessment  RD working remotely.   DOCUMENTATION CODES:   Not applicable  INTERVENTION:  - diet advancement as medically feasible. - will monitor for need for nutrition support.    NUTRITION DIAGNOSIS:   Inadequate oral intake related to acute illness, nausea, vomiting as evidenced by per patient/family report, NPO status.  GOAL:   Patient will meet greater than or equal to 90% of their needs  MONITOR:   Diet advancement, Labs, Weight trends  REASON FOR ASSESSMENT:   Malnutrition Screening Tool  ASSESSMENT:   69 y.o. female with medical history significant of HTN, hyperlipidemia, hypothyroidism, anxiety disorder, and history of prior breast malignancy. 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  Patient has been NPO since admission. Able to talk with patient over the phone. She reports that symptoms are improved today, decreased nausea, only slight abdominal pain, no vomiting.   She reports that symptoms began ~5 weeks ago and worsened over time. She was consuming only bland food due to symptoms, but over time was unable to tolerate much of these items. The last time she ate solid food was on 2/22 or 2/23 when she fixed green beans and potatoes but was only able to eat a few bites.   Patient reports UBW of 180 lb. Weight today recorded as 183 lb, but patient states she was not weighed today. PTA, the most recently recorded weight was on 08/14/16 when she weighed 177 lb.   Per notes: - Surgery following and plan for ex lap on 3/1   Labs reviewed; K: 3.1 mmol/l, Ca: 8.6 mg/dl. Medications reviewed; 75 mcg oral synthroid/day, 2 g IV Mg sulfate x1 run 2/26, 40 mEq Klor-Con x2 doses 2/27. IVF; D5-1/2 NS-20 mEq KCL @ 100 ml/hr (408 kcal).    NUTRITION - FOCUSED PHYSICAL EXAM:  unable to complete at this time.   Diet Order:   Diet Order            Diet NPO time specified  Except for: Ice Chips, Sips with Meds  Diet effective now              EDUCATION NEEDS:   Not appropriate for education at this time  Skin:  Skin Assessment: Reviewed RN Assessment  Last BM:  PTA/unknown  Height:   Ht Readings from Last 1 Encounters:  07/21/19 5\' 3"  (1.6 m)    Weight:   Wt Readings from Last 1 Encounters:  07/22/19 83 kg    Ideal Body Weight:  52.3 kg  BMI:  Body mass index is 32.41 kg/m.  Estimated Nutritional Needs:   Kcal:  [id number] kcal  Protein:  100-110 grams  Fluid:  >/= 2.1 L/day      Jarome Matin, MS, RD, LDN, CNSC Inpatient Clinical Dietitian RD pager # available in AMION  After hours/weekend pager # available in [facility]

## 2019-07-22 NOTE — Progress Notes (Signed)
Subjective: Patient has mild abdominal cramping.  Is passing gas but has had no bowel movement.  No nausea or vomiting have been noted.  Objective: Vital signs in last 24 hours: Temp:  [98.2 F (36.8 C)-99.1 F (37.3 C)] 98.2 F (36.8 C) (02/27 0549) Pulse Rate:  [59-70] 67 (02/27 0848) Resp:  [18] 18 (02/27 0549) BP: (107-127)/(62-71) 127/62 (02/27 0848) SpO2:  [95 %-97 %] 97 % (02/27 0549) Weight:  [83 kg] 83 kg (02/27 0549) Last BM Date: (PTA)  Intake/Output from previous day: 02/26 0701 - 02/27 0700 In: 1447.3 [I.V.:1447.3] Out: 700 [Urine:700] Intake/Output this shift: Total I/O In: 1478.2 [I.V.:1478.2] Out: -   General appearance: alert, cooperative and no distress GI: Soft, nontender, nondistended.  Occasional bowel sounds appreciated.  No rigidity is noted.  Lab Results:  Recent Labs    07/21/19 0503 07/22/19 0520  WBC 10.5 8.0  HGB 13.6 13.1  HCT 40.7 39.4  PLT 216 287   BMET Recent Labs    07/21/19 0503 07/22/19 0521  NA 139 139  K 2.9* 3.1*  CL 103 106  CO2 24 25  GLUCOSE 79 111*  BUN 15 9  CREATININE 0.85 0.75  CALCIUM 8.8* 8.6*   PT/INR No results for input(s): LABPROT, INR in the last 72 hours.  Studies/Results: CT Abdomen Pelvis W Contrast  Result Date: 07/20/2019 CLINICAL DATA:  Abdominal pain emesis for 5 weeks EXAM: CT ABDOMEN AND PELVIS WITH CONTRAST TECHNIQUE: Multidetector CT imaging of the abdomen and pelvis was performed using the standard protocol following bolus administration of intravenous contrast. CONTRAST:  142mL OMNIPAQUE IOHEXOL 300 MG/ML  SOLN COMPARISON:  HIDA scan 07/27/2016 FINDINGS: Lower chest: Lung bases are clear. Normal heart size. No pericardial effusion. Hepatobiliary: No focal liver abnormality is seen. No gallstones, gallbladder wall thickening, or biliary dilatation. Pancreas: Unremarkable. No pancreatic ductal dilatation or surrounding inflammatory changes. Spleen: Normal in size without focal abnormality.  Adrenals/Urinary Tract: Kidneys are somewhat diminutive. Few parapelvic cysts are noted for instance a 1.6 cm cyst in the interpolar left kidney (2/34). No suspicious renal lesions. No urolithiasis or hydronephrosis. Urinary bladder is unremarkable. Stomach/Bowel: Distal esophagus and stomach are free of acute abnormality. Duodenum takes a normal sweep across the abdomen. Small air and fluid-filled duodenal diverticular noted about the level distal abrupt transition at the level of the ligament of Treitz (2/36) and just distally associated with the proximal jejunum (2/29). There is a long segment of mid to distal small bowel demonstrating air and fluid distention. A proximal transition point is not clearly identified however there is focal distal transition at the level of a hyperenhancing nodule with in the low midline abdomen (2/72). Small bowel distal to this focus is largely decompressed. A geometric radiodensity within the distended small bowel segment (2/44) may reflect a medication or other ingested material. Cecum is slightly displaced towards the midline abdomen. Appendix is not well seen. No colonic dilatation or wall thickening. Vascular/Lymphatic: The aorta is normal caliber. No suspicious or enlarged lymph nodes in the included lymphatic chains. Reproductive: Uterus is surgically absent. No concerning adnexal lesions. Other: No free fluid. No free air. No organized abscess or collection. No bowel containing hernia. Musculoskeletal: Multilevel degenerative changes are present in the imaged portions of the spine. No acute osseous abnormality or suspicious osseous lesion. Mild dextrocurvature of the spine with an apex at the thoracolumbar junction. IMPRESSION: Long segment of mid to distal small bowel demonstrating air and fluid distention with focal distal transition point at the  level of a hyperenhancing 0.7 x 1.5 cm nodule in the low midline abdomen (2/71). Findings are concerning for at least partial  small bowel obstruction. The appearance of this lesion is worrisome for possible malignancy either primary or metastatic. Electronically Signed   By: Lovena Le M.D.   On: 07/20/2019 22:50    Anti-infectives: Anti-infectives (From admission, onward)   None      Assessment/Plan: Impression: Partial small bowel obstruction, hypomagnesemia, hypokalemia. Patient is doing okay without an NG tube.  I had extensive discussion with the patient and told her that more than likely she needs an exploratory laparotomy with possible partial small bowel resection due to the findings on CAT scan.  She understands this.  Her hypokalemia is being treated.  Intend on performing exploratory laparotomy on 07/24/2019.  LOS: 1 day    Aviva Signs 07/22/2019

## 2019-07-22 NOTE — Plan of Care (Signed)
  Problem: Safety: Goal: Ability to remain free from injury will improve Outcome: Progressing   Problem: Elimination: Goal: Will not experience complications related to bowel motility Outcome: Not Progressing     

## 2019-07-22 NOTE — Progress Notes (Signed)
Patient Demographics:    Hailey Randolph, is a 69 y.o. female, DOB - 20-Feb-1951, [id number]  Admit date - 07/20/2019   Admitting Physician Lynetta Mare, MD  Outpatient Primary MD for the patient is Asencion Noble, MD  LOS - 1   Chief Complaint  Patient presents with  . Abdominal Pain        Subjective:    Hailey Randolph today has no fevers, ,  No chest pain,   - Female Relative at bedside -no emesis , she had a small BM    Assessment  & Plan :    Principal Problem:   SBO (small bowel obstruction) (HCC) Active Problems:   Nausea with vomiting   Breast cancer [facility])  Brief Summary 69 y.o. 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 Arnoldo Morale appreciated -24 HIAA to assess for carcinoid disease,  ---CEA levels pending -Further management per surgical team team -No emesis at this time, okay to have sips and chips  --Exploratory laparotomy planned for Monday, 2/29/2021   2)HTN-stop Losartan HCTZ due to n.p.o. status with risk for dehydration and AKI  3)Hypothyroidism--- TSH 0.6, continue with thyroxine  4)Anxiety Disorder/Insomnia--- continue on Ambien and Ativan  5)Hypokalemia-- Stop HCTZ, replaced orally and IV.  Received iv magnesium  Disposition/Need for in-Hospital Stay- patient unable to be discharged at this time due to --SBO with electrolyte abnormalities requiring continuous IV fluid and IV replacements -Medically Not ready for discharge home due to inability to have oral intake, need for IV fluids -Exploratory laparotomy planned for  Monday, 2/29/2021  Code Status : Full code  Family Communication:   NA (patient is alert, awake and coherent)  Consults  :  Gen surg  DVT Prophylaxis  :  Lovenox -  SCDs    Lab Results  Component Value Date   PLT 287 07/22/2019   Inpatient Medications  Scheduled Meds: . amLODipine  5 mg Oral Daily  . atorvastatin  20 mg Oral QHS  . enoxaparin (LOVENOX) injection  40 mg Subcutaneous Q24H  . levothyroxine  75 mcg Oral QAC breakfast  . sodium chloride flush  3 mL Intravenous Once  . sodium chloride flush  3 mL Intravenous Q12H  . zolpidem  5 mg Oral QHS   Continuous Infusions: . sodium chloride    . dextrose 5 % and 0.45 % NaCl with KCl 20 mEq/L 100 mL/hr at 07/22/19 1251   PRN Meds:.sodium chloride, acetaminophen **OR** acetaminophen, bisacodyl, LORazepam, morphine injection, ondansetron **OR** ondansetron (ZOFRAN) IV, sodium chloride flush   Anti-infectives (From admission, onward)   None        Objective:   Vitals:   07/21/19 2217 07/22/19 0549 07/22/19 0848 07/22/19 1406  BP: 119/71 107/65 127/62 125/83  Pulse: (!) 59 68 67 70  Resp: 18 18  17   Temp: 99.1 F (37.3 C) 98.2 F (36.8 C)  98 F (36.7 C)  TempSrc: Oral Oral  Oral  SpO2: 97% 97%  98%  Weight:  83 kg    Height:        Wt Readings from Last 3 Encounters:  07/22/19 83 kg  11/02/16 80.3 kg  08/14/16 80.3 kg     Intake/Output Summary (Last 24 hours) at 07/22/2019 1456 Last data filed at 07/22/2019 1444 Gross per 24 hour  Intake 2617.45 ml  Output 800 ml  Net 1817.45 ml   Physical Exam  Gen:- Awake Alert,  In no apparent distress  HEENT:- [place].AT, No sclera icterus Neck-Supple Neck,No JVD,.  Lungs-  CTAB , fair symmetrical air movement CV- S1, S2 normal, regular  Abd-  +ve B.Sounds, Abd Soft, mild abdominal discomfort without rebound or guarding ,    Extremity/Skin:- No  edema, pedal pulses present  Psych-affect is appropriate, oriented x3 Neuro-no new focal deficits, no tremors   Data  Review:   Micro Results Recent Results (from the past 240 hour(s))  SARS CORONAVIRUS 2 (TAT 6-24 HRS) Nasopharyngeal Nasopharyngeal Swab     Status: None   Collection Time: 07/20/19 10:46 PM   Specimen: Nasopharyngeal Swab  Result Value Ref Range Status   SARS Coronavirus 2 NEGATIVE NEGATIVE Final    Comment: (NOTE) SARS-CoV-2 target nucleic acids are NOT DETECTED. The SARS-CoV-2 RNA is generally detectable in upper and lower respiratory specimens during the acute phase of infection. Negative results do not preclude SARS-CoV-2 infection, do not rule out co-infections with other pathogens, and should not be used as the sole basis for treatment or other patient management decisions. Negative results must be combined with clinical observations, patient history, and epidemiological information. The expected result is Negative. Fact Sheet for Patients: SugarRoll.be Fact Sheet for Healthcare Providers: https://www.woods-mathews.com/ This test is not yet approved or cleared by the [place] FDA and  has been authorized for detection and/or diagnosis of SARS-CoV-2 by FDA under an Emergency Use Authorization (EUA). This EUA will remain  in effect (meaning this test can be used) for the duration of the COVID-19 declaration under Section 56 4(b)(1) of the Act, 21 U.S.C. section 360bbb-3(b)(1), unless the authorization is terminated or revoked sooner. Performed at [facility], [place] [address], [place], [place] [address]     Radiology Reports CT Abdomen Pelvis W Contrast  Result Date: 07/20/2019 CLINICAL DATA:  Abdominal pain emesis for 5 weeks EXAM: CT ABDOMEN AND PELVIS WITH CONTRAST TECHNIQUE: Multidetector CT imaging of the abdomen and pelvis was performed using the standard protocol following bolus administration of intravenous contrast. CONTRAST:  149mL OMNIPAQUE IOHEXOL 300 MG/ML  SOLN COMPARISON:  HIDA scan 07/27/2016 FINDINGS: Lower  chest: Lung bases are clear. Normal heart size. No pericardial effusion. Hepatobiliary: No focal liver abnormality is seen. No gallstones, gallbladder wall thickening, or biliary dilatation. Pancreas: Unremarkable. No pancreatic ductal dilatation or surrounding inflammatory changes. Spleen: Normal in size without focal abnormality. Adrenals/Urinary Tract: Kidneys are somewhat diminutive. Few parapelvic cysts are noted for instance a 1.6 cm cyst in the interpolar left kidney (2/34). No suspicious renal lesions. No urolithiasis or hydronephrosis. Urinary bladder is unremarkable. Stomach/Bowel: Distal esophagus and stomach are free of acute abnormality. Duodenum takes a normal sweep across the abdomen. Small air and fluid-filled duodenal diverticular noted about the level distal abrupt transition at the level of the ligament of Treitz (2/36) and just distally associated with the proximal jejunum (2/29). There is a long segment of mid to distal small bowel demonstrating air and fluid distention. A proximal transition point is not clearly identified however there is focal distal transition at the level of  a hyperenhancing nodule with in the low midline abdomen (2/72). Small bowel distal to this focus is largely decompressed. A geometric radiodensity within the distended small bowel segment (2/44) may reflect a medication or other ingested material. Cecum is slightly displaced towards the midline abdomen. Appendix is not well seen. No colonic dilatation or wall thickening. Vascular/Lymphatic: The aorta is normal caliber. No suspicious or enlarged lymph nodes in the included lymphatic chains. Reproductive: Uterus is surgically absent. No concerning adnexal lesions. Other: No free fluid. No free air. No organized abscess or collection. No bowel containing hernia. Musculoskeletal: Multilevel degenerative changes are present in the imaged portions of the spine. No acute osseous abnormality or suspicious osseous lesion. Mild  dextrocurvature of the spine with an apex at the thoracolumbar junction. IMPRESSION: Long segment of mid to distal small bowel demonstrating air and fluid distention with focal distal transition point at the level of a hyperenhancing 0.7 x 1.5 cm nodule in the low midline abdomen (2/71). Findings are concerning for at least partial small bowel obstruction. The appearance of this lesion is worrisome for possible malignancy either primary or metastatic. Electronically Signed   By: Lovena Le M.D.   On: 07/20/2019 22:50     CBC Recent Labs  Lab 07/20/19 2035 07/21/19 0503 07/22/19 0520  WBC 9.0 10.5 8.0  HGB 14.3 13.6 13.1  HCT 42.2 40.7 39.4  PLT 287 216 287  MCV 93.8 92.9 93.4  MCH 31.8 31.1 31.0  MCHC 33.9 33.4 33.2  RDW 12.7 12.7 12.6    Chemistries  Recent Labs  Lab 07/20/19 2035 07/21/19 0503 07/22/19 0521  NA 139 139 139  K 3.0* 2.9* 3.1*  CL 101 103 106  CO2 26 24 25   GLUCOSE 104* 79 111*  BUN 18 15 9   CREATININE 1.02* 0.85 0.75  CALCIUM 9.0 8.8* 8.6*  AST 19  --   --   ALT 24  --   --   ALKPHOS 78  --   --   BILITOT 0.8  --   --    ------------------------------------------------------------------------------------------------------------------ No results for input(s): CHOL, HDL, LDLCALC, TRIG, CHOLHDL, LDLDIRECT in the last 72 hours.  No results found for: HGBA1C ------------------------------------------------------------------------------------------------------------------ Recent Labs    07/21/19 0539  TSH 0.626   ------------------------------------------------------------------------------------------------------------------ No results for input(s): VITAMINB12, FOLATE, FERRITIN, TIBC, IRON, RETICCTPCT in the last 72 hours.  Coagulation profile No results for input(s): INR, PROTIME in the last 168 hours.  No results for input(s): DDIMER in the last 72 hours.  Cardiac Enzymes No results for input(s): CKMB, TROPONINI, MYOGLOBIN in the last 168  hours.  Invalid input(s): CK ------------------------------------------------------------------------------------------------------------------ No results found for: BNP   Roxan Hockey M.D on 07/22/2019 at 2:56 PM  Go to [email or web address] - for contact info  [facility] - Office  [phone]

## 2019-07-22 NOTE — Plan of Care (Signed)
  Problem: Education: Goal: Knowledge of General Education information will improve Description: Including pain rating scale, medication(s)/side effects and non-pharmacologic comfort measures Outcome: Progressing   Problem: Health Behavior/Discharge Planning: Goal: Ability to manage health-related needs will improve Outcome: Progressing   Problem: Clinical Measurements: Goal: Diagnostic test results will improve Outcome: Progressing   Problem: Activity: Goal: Risk for activity intolerance will decrease Outcome: Progressing   Problem: Coping: Goal: Level of anxiety will decrease Outcome: Progressing   Problem: Elimination: Goal: Will not experience complications related to bowel motility Outcome: Progressing

## 2019-07-23 ENCOUNTER — Inpatient Hospital Stay (HOSPITAL_COMMUNITY): Payer: Medicare Other

## 2019-07-23 LAB — BASIC METABOLIC PANEL
Anion gap: 8 (ref 5–15)
BUN: 6 mg/dL — ABNORMAL LOW (ref 8–23)
CO2: 22 mmol/L (ref 22–32)
Calcium: 8.2 mg/dL — ABNORMAL LOW (ref 8.9–10.3)
Chloride: 109 mmol/L (ref 98–111)
Creatinine, Ser: 0.73 mg/dL (ref 0.44–1.00)
GFR calc Af Amer: >60 mL/min (ref >60)
GFR calc non Af Amer: >60 mL/min (ref >60)
Glucose, Bld: 101 mg/dL — ABNORMAL HIGH (ref 70–99)
Potassium: 3.8 mmol/L (ref 3.5–5.1)
Sodium: 139 mmol/L (ref 135–145)

## 2019-07-23 LAB — SURGICAL PCR SCREEN
MRSA, PCR: NEGATIVE
Staphylococcus aureus: NEGATIVE

## 2019-07-23 MED ORDER — CHLORHEXIDINE GLUCONATE CLOTH 2 % EX PADS
6.0000 | MEDICATED_PAD | Freq: Once | CUTANEOUS | Status: AC
  Administered 2019-07-23: 6 via TOPICAL

## 2019-07-23 MED ORDER — CHLORHEXIDINE GLUCONATE CLOTH 2 % EX PADS: 6.0000 | MEDICATED_PAD | Freq: Once | CUTANEOUS | Status: DC

## 2019-07-23 MED ORDER — SODIUM CHLORIDE 0.9 % IV SOLN
1.0000 g | INTRAVENOUS | Status: AC
  Administered 2019-07-24: 1 g via INTRAVENOUS
  Filled 2019-07-23: qty 1

## 2019-07-23 MED ORDER — ENOXAPARIN SODIUM 40 MG/0.4ML ~~LOC~~ SOLN
40.0000 mg | SUBCUTANEOUS | Status: DC
  Administered 2019-07-24 – 2019-07-27 (×3): 40 mg via SUBCUTANEOUS
  Filled 2019-07-23 (×2): qty 0.4

## 2019-07-23 MED ORDER — ALVIMOPAN 12 MG PO CAPS
12.0000 mg | ORAL_CAPSULE | ORAL | Status: AC
  Administered 2019-07-24: 07:00:00 12 mg via ORAL
  Filled 2019-07-23: qty 1

## 2019-07-23 NOTE — Progress Notes (Signed)
  Subjective: Patient has no complaints.  Did have a small bowel movement yesterday.  Objective: Vital signs in last 24 hours: Temp:  [98 F (36.7 C)-98.2 F (36.8 C)] 98.2 F (36.8 C) (02/28 0558) Pulse Rate:  [65-70] 65 (02/28 0558) Resp:  [17] 17 (02/28 0558) BP: (119-140)/(74-83) 140/79 (02/28 0558) SpO2:  [97 %-98 %] 97 % (02/28 0558) Weight:  [84.2 kg] 84.2 kg (02/28 0419) Last BM Date: 07/22/19  Intake/Output from previous day: 02/27 0701 - 02/28 0700 In: 2392.8 [I.V.:2392.8] Out: 650 [Urine:650] Intake/Output this shift: Total I/O In: 240 [P.O.:240] Out: -   General appearance: alert, cooperative and no distress GI: soft, non-tender; bowel sounds normal; no masses,  no organomegaly  Lab Results:  Recent Labs    07/21/19 0503 07/22/19 0520  WBC 10.5 8.0  HGB 13.6 13.1  HCT 40.7 39.4  PLT 216 287   BMET Recent Labs    07/22/19 0521 07/23/19 0523  NA 139 139  K 3.1* 3.8  CL 106 109  CO2 25 22  GLUCOSE 111* 101*  BUN 9 6*  CREATININE 0.75 0.73  CALCIUM 8.6* 8.2*   PT/INR No results for input(s): LABPROT, INR in the last 72 hours.  Studies/Results: No results found.  Anti-infectives: Anti-infectives (From admission, onward)   None      Assessment/Plan: Impression: Partial small bowel obstruction, question neoplasm of small bowel.  Hypokalemia resolved. Plan: Patient is scheduled for exploratory laparotomy, possible small bowel resection tomorrow.  The risks and benefits of the procedure including bleeding, infection, bowel resection, and the possibility of a negative exploratory laparotomy were fully explained to the patient, who gave informed consent.  LOS: 2 days    Aviva Signs 07/23/2019

## 2019-07-23 NOTE — Progress Notes (Signed)
Patient Demographics:    Hailey Randolph, is a 69 y.o. female, DOB - March 23, 1951, [id number]  Admit date - 07/20/2019   Admitting Physician Lynetta Mare, MD  Outpatient Primary MD for the patient is Asencion Noble, MD  LOS - 2   Chief Complaint  Patient presents with  . Abdominal Pain        Subjective:    Hailey Randolph today has no fevers, ,  No chest pain,    -Abdominal pain manageable, no emesis, tolerating ice chips -Had a small BM on 07/22/2019   Assessment  & Plan :    Principal Problem:   SBO (small bowel obstruction) (HCC) Active Problems:   Nausea with vomiting   Breast cancer [facility])  Brief Summary 69 y.o. 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 Arnoldo Morale appreciated -24 HIAA to assess for carcinoid disease,  ---CEA levels pending -Further management per surgical team team -No emesis at this time, okay to have sips and chips  --Exploratory laparotomy planned for Monday, 07/24/2019   2)HTN-stop Losartan HCTZ due to n.p.o. status with risk for dehydration and AKI  3)Hypothyroidism--- TSH 0.6, continue with thyroxine  4)Anxiety Disorder/Insomnia--- continue on Ambien and Ativan  5)FEN/Hypokalemia-- Stop HCTZ, replaced orally and IV.  Received iv magnesium -Continue IV dextrose solution while n.p.o.   Disposition/Need for in-Hospital Stay- patient unable to be discharged at this time due to --SBO with electrolyte abnormalities requiring continuous IV fluid and IV replacements -Medically  Not ready for discharge home due to inability to have oral intake, need for IV fluids -Exploratory laparotomy planned for Monday, 07/24/2019  Code Status : Full code  Family Communication:   NA (patient is alert, awake and coherent)  Consults  :  Gen surg  DVT Prophylaxis  :  Lovenox -  SCDs    Lab Results  Component Value Date   PLT 287 07/22/2019   Inpatient Medications  Scheduled Meds: . [START ON 07/24/2019] alvimopan  12 mg Oral On Call to OR  . amLODipine  5 mg Oral Daily  . atorvastatin  20 mg Oral QHS  . Chlorhexidine Gluconate Cloth  6 each Topical Once   And  . Chlorhexidine Gluconate Cloth  6 each Topical Once  . Chlorhexidine Gluconate Cloth  6 each Topical Once   And  . Chlorhexidine Gluconate Cloth  6 each Topical Once  . enoxaparin (LOVENOX) injection  40 mg Subcutaneous Q24H  . levothyroxine  75 mcg Oral QAC breakfast  . sodium chloride flush  3 mL Intravenous Once  . sodium chloride flush  3 mL Intravenous Q12H  . zolpidem  5 mg Oral QHS   Continuous Infusions: . sodium chloride    . dextrose 5 % and 0.45 % NaCl with KCl 20 mEq/L 100 mL/hr at 07/23/19 0902  . [START ON 07/24/2019] ertapenem     PRN Meds:.sodium chloride, acetaminophen **OR** acetaminophen, bisacodyl, LORazepam, morphine injection, ondansetron **OR** ondansetron (ZOFRAN) IV,  sodium chloride flush   Anti-infectives (From admission, onward)   Start     Dose/Rate Route Frequency Ordered Stop   07/24/19 0600  ertapenem (INVANZ) 1,000 mg in sodium chloride 0.9 % 100 mL IVPB     1 g 200 mL/hr over 30 Minutes Intravenous On call to O.R. 07/23/19 1223 07/25/19 0559        Objective:   Vitals:   07/22/19 2108 07/23/19 0419 07/23/19 0558 07/23/19 1333  BP: 119/74  140/79 119/65  Pulse: 67  65 61  Resp: 17  17 17   Temp: 98.2 F (36.8 C)  98.2 F (36.8 C) 97.9 F (36.6 C)  TempSrc: Oral  Oral Oral  SpO2: 97%  97% 96%  Weight:  84.2 kg    Height:        Wt Readings from Last 3  Encounters:  07/23/19 84.2 kg  11/02/16 80.3 kg  08/14/16 80.3 kg     Intake/Output Summary (Last 24 hours) at 07/23/2019 1651 Last data filed at 07/23/2019 0900 Gross per 24 hour  Intake 561.6 ml  Output 250 ml  Net 311.6 ml   Physical Exam  Gen:- Awake Alert,  In no apparent distress  HEENT:- [place].AT, No sclera icterus Neck-Supple Neck,No JVD,.  Lungs-  CTAB , fair symmetrical air movement CV- S1, S2 normal, regular  Abd-  +ve B.Sounds, Abd Soft, mild abdominal discomfort without rebound or guarding ,    Extremity/Skin:- No  edema, pedal pulses present  Psych-affect is appropriate, oriented x3 Neuro-no new focal deficits, no tremors   Data Review:   Micro Results Recent Results (from the past 240 hour(s))  SARS CORONAVIRUS 2 (TAT 6-24 HRS) Nasopharyngeal Nasopharyngeal Swab     Status: None   Collection Time: 07/20/19 10:46 PM   Specimen: Nasopharyngeal Swab  Result Value Ref Range Status   SARS Coronavirus 2 NEGATIVE NEGATIVE Final    Comment: (NOTE) SARS-CoV-2 target nucleic acids are NOT DETECTED. The SARS-CoV-2 RNA is generally detectable in upper and lower respiratory specimens during the acute phase of infection. Negative results do not preclude SARS-CoV-2 infection, do not rule out co-infections with other pathogens, and should not be used as the sole basis for treatment or other patient management decisions. Negative results must be combined with clinical observations, patient history, and epidemiological information. The expected result is Negative. Fact Sheet for Patients: SugarRoll.be Fact Sheet for Healthcare Providers: https://www.woods-mathews.com/ This test is not yet approved or cleared by the [place] FDA and  has been authorized for detection and/or diagnosis of SARS-CoV-2 by FDA under an Emergency Use Authorization (EUA). This EUA will remain  in effect (meaning this test can be used) for the duration of  the COVID-19 declaration under Section 56 4(b)(1) of the Act, 21 U.S.C. section 360bbb-3(b)(1), unless the authorization is terminated or revoked sooner. Performed at [facility], [place] [address], [place], [place] [address]     Radiology Reports CT Abdomen Pelvis W Contrast  Result Date: 07/20/2019 CLINICAL DATA:  Abdominal pain emesis for 5 weeks EXAM: CT ABDOMEN AND PELVIS WITH CONTRAST TECHNIQUE: Multidetector CT imaging of the abdomen and pelvis was performed using the standard protocol following bolus administration of intravenous contrast. CONTRAST:  129mL OMNIPAQUE IOHEXOL 300 MG/ML  SOLN COMPARISON:  HIDA scan 07/27/2016 FINDINGS: Lower chest: Lung bases are clear. Normal heart size. No pericardial effusion. Hepatobiliary: No focal liver abnormality is seen. No gallstones, gallbladder wall thickening, or biliary dilatation. Pancreas: Unremarkable. No pancreatic ductal dilatation or surrounding  inflammatory changes. Spleen: Normal in size without focal abnormality. Adrenals/Urinary Tract: Kidneys are somewhat diminutive. Few parapelvic cysts are noted for instance a 1.6 cm cyst in the interpolar left kidney (2/34). No suspicious renal lesions. No urolithiasis or hydronephrosis. Urinary bladder is unremarkable. Stomach/Bowel: Distal esophagus and stomach are free of acute abnormality. Duodenum takes a normal sweep across the abdomen. Small air and fluid-filled duodenal diverticular noted about the level distal abrupt transition at the level of the ligament of Treitz (2/36) and just distally associated with the proximal jejunum (2/29). There is a long segment of mid to distal small bowel demonstrating air and fluid distention. A proximal transition point is not clearly identified however there is focal distal transition at the level of a hyperenhancing nodule with in the low midline abdomen (2/72). Small bowel distal to this focus is largely decompressed. A geometric radiodensity within the  distended small bowel segment (2/44) may reflect a medication or other ingested material. Cecum is slightly displaced towards the midline abdomen. Appendix is not well seen. No colonic dilatation or wall thickening. Vascular/Lymphatic: The aorta is normal caliber. No suspicious or enlarged lymph nodes in the included lymphatic chains. Reproductive: Uterus is surgically absent. No concerning adnexal lesions. Other: No free fluid. No free air. No organized abscess or collection. No bowel containing hernia. Musculoskeletal: Multilevel degenerative changes are present in the imaged portions of the spine. No acute osseous abnormality or suspicious osseous lesion. Mild dextrocurvature of the spine with an apex at the thoracolumbar junction. IMPRESSION: Long segment of mid to distal small bowel demonstrating air and fluid distention with focal distal transition point at the level of a hyperenhancing 0.7 x 1.5 cm nodule in the low midline abdomen (2/71). Findings are concerning for at least partial small bowel obstruction. The appearance of this lesion is worrisome for possible malignancy either primary or metastatic. Electronically Signed   By: Lovena Le M.D.   On: 07/20/2019 22:50   DG Chest Port 1 View  Result Date: 07/23/2019 CLINICAL DATA:  Preoperative EXAM: PORTABLE CHEST 1 VIEW COMPARISON:  None. FINDINGS: The heart size and mediastinal contours are within normal limits. Both lungs are clear. The visualized skeletal structures are unremarkable. IMPRESSION: No acute abnormality of the lungs in AP portable examination. Electronically Signed   By: Eddie Candle M.D.   On: 07/23/2019 13:43     CBC Recent Labs  Lab 07/20/19 2035 07/21/19 0503 07/22/19 0520  WBC 9.0 10.5 8.0  HGB 14.3 13.6 13.1  HCT 42.2 40.7 39.4  PLT 287 216 287  MCV 93.8 92.9 93.4  MCH 31.8 31.1 31.0  MCHC 33.9 33.4 33.2  RDW 12.7 12.7 12.6    Chemistries  Recent Labs  Lab 07/20/19 2035 07/21/19 0503 07/22/19 0521  07/23/19 0523  NA 139 139 139 139  K 3.0* 2.9* 3.1* 3.8  CL 101 103 106 109  CO2 26 24 25 22   GLUCOSE 104* 79 111* 101*  BUN 18 15 9  6*  CREATININE 1.02* 0.85 0.75 0.73  CALCIUM 9.0 8.8* 8.6* 8.2*  AST 19  --   --   --   ALT 24  --   --   --   ALKPHOS 78  --   --   --   BILITOT 0.8  --   --   --    ------------------------------------------------------------------------------------------------------------------ No results for input(s): CHOL, HDL, LDLCALC, TRIG, CHOLHDL, LDLDIRECT in the last 72 hours.  No results found for: HGBA1C ------------------------------------------------------------------------------------------------------------------ Recent Labs  07/21/19 0539  TSH 0.626   ------------------------------------------------------------------------------------------------------------------ No results for input(s): VITAMINB12, FOLATE, FERRITIN, TIBC, IRON, RETICCTPCT in the last 72 hours.  Coagulation profile No results for input(s): INR, PROTIME in the last 168 hours.  No results for input(s): DDIMER in the last 72 hours.  Cardiac Enzymes No results for input(s): CKMB, TROPONINI, MYOGLOBIN in the last 168 hours.  Invalid input(s): CK ------------------------------------------------------------------------------------------------------------------ No results found for: BNP   Roxan Hockey M.D on 07/23/2019 at 4:51 PM  Go to [email or web address] - for contact info  [facility] - Office  [phone]

## 2019-07-23 NOTE — H&P (View-Only) (Signed)
  Subjective: Patient has no complaints.  Did have a small bowel movement yesterday.  Objective: Vital signs in last 24 hours: Temp:  [98 F (36.7 C)-98.2 F (36.8 C)] 98.2 F (36.8 C) (02/28 0558) Pulse Rate:  [65-70] 65 (02/28 0558) Resp:  [17] 17 (02/28 0558) BP: (119-140)/(74-83) 140/79 (02/28 0558) SpO2:  [97 %-98 %] 97 % (02/28 0558) Weight:  [84.2 kg] 84.2 kg (02/28 0419) Last BM Date: 07/22/19  Intake/Output from previous day: 02/27 0701 - 02/28 0700 In: 2392.8 [I.V.:2392.8] Out: 650 [Urine:650] Intake/Output this shift: Total I/O In: 240 [P.O.:240] Out: -   General appearance: alert, cooperative and no distress GI: soft, non-tender; bowel sounds normal; no masses,  no organomegaly  Lab Results:  Recent Labs    07/21/19 0503 07/22/19 0520  WBC 10.5 8.0  HGB 13.6 13.1  HCT 40.7 39.4  PLT 216 287   BMET Recent Labs    07/22/19 0521 07/23/19 0523  NA 139 139  K 3.1* 3.8  CL 106 109  CO2 25 22  GLUCOSE 111* 101*  BUN 9 6*  CREATININE 0.75 0.73  CALCIUM 8.6* 8.2*   PT/INR No results for input(s): LABPROT, INR in the last 72 hours.  Studies/Results: No results found.  Anti-infectives: Anti-infectives (From admission, onward)   None      Assessment/Plan: Impression: Partial small bowel obstruction, question neoplasm of small bowel.  Hypokalemia resolved. Plan: Patient is scheduled for exploratory laparotomy, possible small bowel resection tomorrow.  The risks and benefits of the procedure including bleeding, infection, bowel resection, and the possibility of a negative exploratory laparotomy were fully explained to the patient, who gave informed consent.  LOS: 2 days    Aviva Signs 07/23/2019

## 2019-07-24 ENCOUNTER — Encounter (HOSPITAL_COMMUNITY)
Admission: EM | Disposition: A | Discharge status: Home / Self Care | Payer: Self-pay | Source: Home / Self Care | Attending: Family Medicine

## 2019-07-24 ENCOUNTER — Inpatient Hospital Stay (HOSPITAL_COMMUNITY): Payer: Medicare Other | Admitting: Anesthesiology

## 2019-07-24 ENCOUNTER — Encounter (HOSPITAL_COMMUNITY): Payer: Self-pay | Admitting: Internal Medicine

## 2019-07-24 ENCOUNTER — Telehealth: Payer: Self-pay

## 2019-07-24 HISTORY — PX: BOWEL RESECTION: SHX1257

## 2019-07-24 HISTORY — PX: LAPAROTOMY: SHX154

## 2019-07-24 SURGERY — LAPAROTOMY, EXPLORATORY
Anesthesia: General | Site: Abdomen

## 2019-07-24 MED ORDER — ONDANSETRON HCL 4 MG/2ML IJ SOLN
INTRAMUSCULAR | Status: AC
  Filled 2019-07-24: qty 2

## 2019-07-24 MED ORDER — SUGAMMADEX SODIUM 200 MG/2ML IV SOLN
INTRAVENOUS | Status: DC | PRN
  Administered 2019-07-24: 336.8 mg via INTRAVENOUS

## 2019-07-24 MED ORDER — LACTATED RINGERS IV SOLN: Freq: Once | INTRAVENOUS | Status: AC

## 2019-07-24 MED ORDER — MEPERIDINE HCL 50 MG/ML IJ SOLN: 6.2500 mg | INTRAMUSCULAR | Status: DC | PRN

## 2019-07-24 MED ORDER — FENTANYL CITRATE (PF) 250 MCG/5ML IJ SOLN
INTRAMUSCULAR | Status: AC
  Filled 2019-07-24: qty 5

## 2019-07-24 MED ORDER — PHENYLEPHRINE 40 MCG/ML (10ML) SYRINGE FOR IV PUSH (FOR BLOOD PRESSURE SUPPORT)
PREFILLED_SYRINGE | INTRAVENOUS | Status: AC
  Filled 2019-07-24: qty 10

## 2019-07-24 MED ORDER — FENTANYL CITRATE (PF) 100 MCG/2ML IJ SOLN
INTRAMUSCULAR | Status: DC | PRN
  Administered 2019-07-24 (×4): 50 ug via INTRAVENOUS

## 2019-07-24 MED ORDER — LIDOCAINE 2% (20 MG/ML) 5 ML SYRINGE
INTRAMUSCULAR | Status: AC
  Filled 2019-07-24: qty 15

## 2019-07-24 MED ORDER — DEXAMETHASONE SODIUM PHOSPHATE 4 MG/ML IJ SOLN
INTRAMUSCULAR | Status: DC | PRN
  Administered 2019-07-24: 8 mg via INTRAVENOUS

## 2019-07-24 MED ORDER — GLYCOPYRROLATE PF 0.2 MG/ML IJ SOSY
PREFILLED_SYRINGE | INTRAMUSCULAR | Status: AC
  Filled 2019-07-24: qty 1

## 2019-07-24 MED ORDER — HYDROMORPHONE HCL 1 MG/ML IJ SOLN
0.2500 mg | INTRAMUSCULAR | Status: DC | PRN
  Administered 2019-07-24: 0.5 mg via INTRAVENOUS

## 2019-07-24 MED ORDER — SUCCINYLCHOLINE CHLORIDE 200 MG/10ML IV SOSY
PREFILLED_SYRINGE | INTRAVENOUS | Status: DC | PRN
  Administered 2019-07-24: 120 mg via INTRAVENOUS

## 2019-07-24 MED ORDER — SODIUM CHLORIDE 0.9 % IR SOLN
Status: DC | PRN
  Administered 2019-07-24: 1000 mL

## 2019-07-24 MED ORDER — BUPIVACAINE LIPOSOME 1.3 % IJ SUSP
INTRAMUSCULAR | Status: AC
  Filled 2019-07-24: qty 20

## 2019-07-24 MED ORDER — PROPOFOL 10 MG/ML IV BOLUS
INTRAVENOUS | Status: DC | PRN
  Administered 2019-07-24: 150 mg via INTRAVENOUS

## 2019-07-24 MED ORDER — EPHEDRINE 5 MG/ML INJ
INTRAVENOUS | Status: AC
  Filled 2019-07-24: qty 10

## 2019-07-24 MED ORDER — ROCURONIUM BROMIDE 10 MG/ML (PF) SYRINGE
PREFILLED_SYRINGE | INTRAVENOUS | Status: AC
  Filled 2019-07-24: qty 10

## 2019-07-24 MED ORDER — SUGAMMADEX SODIUM 200 MG/2ML IV SOLN: INTRAVENOUS | Status: DC | PRN

## 2019-07-24 MED ORDER — POVIDONE-IODINE 10 % OINT PACKET
TOPICAL_OINTMENT | CUTANEOUS | Status: DC | PRN
  Administered 2019-07-24: 1 via TOPICAL

## 2019-07-24 MED ORDER — BUPIVACAINE LIPOSOME 1.3 % IJ SUSP
INTRAMUSCULAR | Status: DC | PRN
  Administered 2019-07-24: 20 mL

## 2019-07-24 MED ORDER — ROCURONIUM BROMIDE 100 MG/10ML IV SOLN
INTRAVENOUS | Status: DC | PRN
  Administered 2019-07-24: 10 mg via INTRAVENOUS
  Administered 2019-07-24 (×2): 20 mg via INTRAVENOUS

## 2019-07-24 MED ORDER — PHENYLEPHRINE HCL (PRESSORS) 10 MG/ML IV SOLN
INTRAVENOUS | Status: DC | PRN
  Administered 2019-07-24 (×3): 80 ug via INTRAVENOUS

## 2019-07-24 MED ORDER — PROPOFOL 10 MG/ML IV BOLUS
INTRAVENOUS | Status: AC
  Filled 2019-07-24: qty 20

## 2019-07-24 MED ORDER — LIDOCAINE 2% (20 MG/ML) 5 ML SYRINGE
INTRAMUSCULAR | Status: AC
  Filled 2019-07-24: qty 5

## 2019-07-24 MED ORDER — CHLORHEXIDINE GLUCONATE CLOTH 2 % EX PADS
6.0000 | MEDICATED_PAD | Freq: Every day | CUTANEOUS | Status: DC
  Administered 2019-07-25 – 2019-07-26 (×2): 6 via TOPICAL

## 2019-07-24 MED ORDER — ENOXAPARIN SODIUM 40 MG/0.4ML ~~LOC~~ SOLN
SUBCUTANEOUS | Status: AC
  Filled 2019-07-24: qty 0.4

## 2019-07-24 MED ORDER — LIDOCAINE HCL (CARDIAC) PF 100 MG/5ML IV SOSY
PREFILLED_SYRINGE | INTRAVENOUS | Status: DC | PRN
  Administered 2019-07-24: 100 mg via INTRAVENOUS

## 2019-07-24 MED ORDER — MIDAZOLAM HCL 2 MG/2ML IJ SOLN
INTRAMUSCULAR | Status: DC | PRN
  Administered 2019-07-24: 1 mg via INTRAVENOUS

## 2019-07-24 MED ORDER — ONDANSETRON HCL 4 MG/2ML IJ SOLN
INTRAMUSCULAR | Status: DC | PRN
  Administered 2019-07-24: 4 mg via INTRAVENOUS

## 2019-07-24 MED ORDER — HYDROMORPHONE HCL 1 MG/ML IJ SOLN
INTRAMUSCULAR | Status: AC
  Filled 2019-07-24: qty 1

## 2019-07-24 MED ORDER — LORAZEPAM 2 MG/ML IJ SOLN
0.5000 mg | Freq: Once | INTRAMUSCULAR | Status: AC
  Administered 2019-07-24: 10:00:00 0.5 mg via INTRAVENOUS
  Filled 2019-07-24: qty 1

## 2019-07-24 MED ORDER — MIDAZOLAM HCL 2 MG/2ML IJ SOLN
INTRAMUSCULAR | Status: AC
  Filled 2019-07-24: qty 2

## 2019-07-24 MED ORDER — LACTATED RINGERS IV SOLN: INTRAVENOUS | Status: DC | PRN

## 2019-07-24 MED ORDER — SUCCINYLCHOLINE CHLORIDE 200 MG/10ML IV SOSY
PREFILLED_SYRINGE | INTRAVENOUS | Status: AC
  Filled 2019-07-24: qty 10

## 2019-07-24 SURGICAL SUPPLY — 52 items
APL PRP STRL LF DISP 70% ISPRP (MISCELLANEOUS) ×1
CHLORAPREP W/TINT 26 (MISCELLANEOUS) ×3 IMPLANT
CLOTH BEACON ORANGE TIMEOUT ST (SAFETY) ×3 IMPLANT
COVER LIGHT HANDLE STERIS (MISCELLANEOUS) ×6 IMPLANT
COVER WAND RF STERILE (DRAPES) ×3 IMPLANT
DRAPE WARM FLUID 44X44 (DRAPES) ×3 IMPLANT
DRSG OPSITE POSTOP 4X10 (GAUZE/BANDAGES/DRESSINGS) ×3 IMPLANT
ELECT REM PT RETURN 9FT ADLT (ELECTROSURGICAL) ×3
ELECTRODE REM PT RTRN 9FT ADLT (ELECTROSURGICAL) ×1 IMPLANT
GLOVE BIO SURGEON STRL SZ 6.5 (GLOVE) ×1 IMPLANT
GLOVE BIO SURGEON STRL SZ7 (GLOVE) ×2 IMPLANT
GLOVE BIO SURGEON STRL SZ7.5 (GLOVE) ×2 IMPLANT
GLOVE BIO SURGEONS STRL SZ 6.5 (GLOVE) ×1
GLOVE BIOGEL M 7.0 STRL (GLOVE) ×4 IMPLANT
GLOVE BIOGEL PI IND STRL 6.5 (GLOVE) IMPLANT
GLOVE BIOGEL PI IND STRL 7.0 (GLOVE) ×2 IMPLANT
GLOVE BIOGEL PI IND STRL 7.5 (GLOVE) IMPLANT
GLOVE BIOGEL PI INDICATOR 6.5 (GLOVE) ×2
GLOVE BIOGEL PI INDICATOR 7.0 (GLOVE) ×4
GLOVE BIOGEL PI INDICATOR 7.5 (GLOVE) ×2
GLOVE SURG SS PI 7.5 STRL IVOR (GLOVE) ×3 IMPLANT
GOWN STRL REUS W/TWL LRG LVL3 (GOWN DISPOSABLE) ×11 IMPLANT
GOWN STRL REUS W/TWL XL LVL3 (GOWN DISPOSABLE) ×2 IMPLANT
HANDLE SUCTION POOLE (INSTRUMENTS) IMPLANT
INST SET MAJOR GENERAL (KITS) ×3 IMPLANT
KIT TURNOVER KIT A (KITS) ×3 IMPLANT
LIGASURE IMPACT 36 18CM CVD LR (INSTRUMENTS) ×3 IMPLANT
MANIFOLD NEPTUNE II (INSTRUMENTS) ×3 IMPLANT
NDL HYPO 18GX1.5 BLUNT FILL (NEEDLE) ×1 IMPLANT
NEEDLE HYPO 18GX1.5 BLUNT FILL (NEEDLE) ×3 IMPLANT
NEEDLE HYPO 22GX1.5 SAFETY (NEEDLE) ×3 IMPLANT
NS IRRIG 1000ML POUR BTL (IV SOLUTION) ×6 IMPLANT
PACK ABDOMINAL MAJOR (CUSTOM PROCEDURE TRAY) ×3 IMPLANT
PAD ARMBOARD 7.5X6 YLW CONV (MISCELLANEOUS) ×3 IMPLANT
RELOAD PROXIMATE 75MM BLUE (ENDOMECHANICALS) ×6 IMPLANT
RELOAD STAPLE 75 3.8 BLU REG (ENDOMECHANICALS) IMPLANT
RELOAD STAPLER LINE PROX 60 GR (STAPLE) ×1 IMPLANT
RETRACTOR WND ALEXIS 25 LRG (MISCELLANEOUS) IMPLANT
RTRCTR WOUND ALEXIS 25CM LRG (MISCELLANEOUS) ×3
SET BASIN LINEN APH (SET/KITS/TRAYS/PACK) ×3 IMPLANT
SPONGE LAP 18X18 RF (DISPOSABLE) ×3 IMPLANT
STAPLER PROXIMATE 75MM BLUE (STAPLE) ×2 IMPLANT
STAPLER RELOAD LINE PROX 60 GR (STAPLE) ×3
STAPLER RELOADABLE 60 GRN THCK (STAPLE) IMPLANT
STAPLER VISISTAT (STAPLE) ×3 IMPLANT
SUCTION POOLE HANDLE (INSTRUMENTS) ×3
SUT CHROMIC 2 0 SH (SUTURE) ×2 IMPLANT
SUT PDS AB 0 CTX 60 (SUTURE) ×4 IMPLANT
SUT PDS AB CT VIOLET #0 27IN (SUTURE) IMPLANT
SUT SILK 3 0 SH CR/8 (SUTURE) ×4 IMPLANT
SYR 20ML LL LF (SYRINGE) ×6 IMPLANT
TRAY FOLEY MTR SLVR 16FR STAT (SET/KITS/TRAYS/PACK) ×3 IMPLANT

## 2019-07-24 NOTE — Care Management Important Message (Signed)
Important Message  Patient Details  Name: Hailey Randolph MRN: [id number] Date of Birth: 05-01-1951   Medicare Important Message Given:  Yes     Tommy Medal 07/24/2019, 11:08 AM

## 2019-07-24 NOTE — Op Note (Signed)
Patient:  Hailey Randolph  DOB:  04/03/1951  MRN:  [id number]   Preop Diagnosis: Small bowel obstruction, question neoplastic process of small bowel  Postop Diagnosis: Same  Procedure: Exploratory laparotomy, partial small bowel resection  Surgeon: Aviva Signs, MD  Assistant: Curlene Labrum, MD  Anes: General endotracheal  Indications: Patient is a 69 year old white female who presents with a partial small bowel obstruction possibly secondary to neoplastic process.  The risks and benefits of the procedure including bleeding, infection, cardiopulmonary difficulties, the possibility of a neoplastic process, and the possibility of a bowel resection were fully explained to the patient, who gave informed consent.  Procedure note: The patient was placed in the supine position.  After induction of general endotracheal anesthesia, the abdomen was prepped and draped using the usual sterile technique with ChloraPrep.  Surgical site confirmation was performed.  A midline incision was made from above the umbilicus to the umbilical level.  The peritoneal cavity was entered into without difficulty.  A small amount of clear fluid was noted within the abdominal cavity.  The small bowel was then exteriorized.  The proximal three quarters of the small bowel was noted to be significantly dilated, with a circumferential strictured area present in the ileum, approximately 3 feet from the terminal ileum.  There was also a single adhesive band from the pelvis extending up towards the terminal ileum and this was incised without difficulty.  The cecum was noted to be within normal limits.  A #75 GIA stapler was placed distal to the strictured area and fired.  This was likewise done to area more proximal of the small bowel where the bowel was not as significantly dilated were attenuated.  Prior to it being fired, we tried to milk the bowel from the ligament of Treitz distally.  There was an abnormal rectangular  metallic foreign body seen on the CT scan.  I did not palpate this.  The GIA stapler was then placed across the proximal area of the small bowel and fired.  The mesentery was then divided using the LigaSure.  The specimen was then removed from the operative field.  A side-to-side enteroenterostomy was done using a GIA-75 stapler.  The enterotomy was closed using a TA 60 stapler.  The staple line was bolstered using 3-0 silk Lembert sutures.  The mesenteric defect was closed using a 2-0 Chromic Gut running suture.  The bowel was returned into the abdominal cavity in an orderly fashion.  The abdominal cavity was then copiously irrigated with normal saline.  The fascia was reapproximated using a looped 0 PDS running suture.  The subcutaneous layer was irrigated with normal saline.  Exparel was instilled into the surrounding wound.  The skin was closed using staples.  Betadine ointment and dry sterile dressings were applied.  All tape and needle counts were correct at the end of the procedure.  The patient was extubated in the operating room and transferred to PACU in stable condition.  Complications: None  EBL: Minimal  Specimen: Small bowel

## 2019-07-24 NOTE — Interval H&P Note (Signed)
History and Physical Interval Note:  07/24/2019 11:11 AM  [name]  has presented today for surgery, with the diagnosis of small bowel obstruction.  The various methods of treatment have been discussed with the patient and family. After consideration of risks, benefits and other options for treatment, the patient has consented to  Procedure(s): EXPLORATORY LAPAROTOMY (N/A) as a surgical intervention.  The patient's history has been reviewed, patient examined, no change in status, stable for surgery.  I have reviewed the patient's chart and labs.  Questions were answered to the patient's satisfaction.     Aviva Signs

## 2019-07-24 NOTE — Transfer of Care (Signed)
Immediate Anesthesia Transfer of Care Note  Patient: Hailey Randolph  Procedure(s) Performed: EXPLORATORY LAPAROTOMY (N/A Abdomen) PARTIAL SMALL BOWEL RESECTION (N/A Abdomen)  Patient Location: PACU  Anesthesia Type:General  Level of Consciousness: awake  Airway & Oxygen Therapy: Patient Spontanous Breathing and Patient connected to face mask oxygen  Post-op Assessment: Report given to RN and Post -op Vital signs reviewed and stable  Post vital signs: Reviewed and stable  Last Vitals:  Vitals Value Taken Time  BP 136/78 07/24/19 1320  Temp    Pulse 80 07/24/19 1324  Resp 18 07/24/19 1324  SpO2 99 % 07/24/19 1324  Vitals shown include unvalidated device data.  Last Pain:  Vitals:   07/24/19 1115  TempSrc:   PainSc: 0-No pain      Patients Stated Pain Goal: 1 ([phone])  Complications: No apparent anesthesia complications

## 2019-07-24 NOTE — Anesthesia Preprocedure Evaluation (Addendum)
Anesthesia Evaluation  Patient identified by MRN, date of birth, ID band Patient awake    Reviewed: Allergy & Precautions, NPO status , Patient's Chart, lab work & pertinent test results  History of Anesthesia Complications Negative for: history of anesthetic complications  Airway Mallampati: II  TM Distance: >3 FB Neck ROM: Full    Dental  (+) Teeth Intact   Pulmonary neg pulmonary ROS,    Pulmonary exam normal breath sounds clear to auscultation       Cardiovascular Exercise Tolerance: Good hypertension, Pt. on medications  Rhythm:Regular Rate:Normal - Systolic murmurs, - Diastolic murmurs, - Friction Rub, - Carotid Bruit, - Peripheral Edema and - Systolic Click    Neuro/Psych Anxiety negative neurological ROS     GI/Hepatic Neg liver ROS, Small bowel obstruction   Endo/Other  Hypothyroidism   Renal/GU negative Renal ROS  negative genitourinary   Musculoskeletal negative musculoskeletal ROS (+)   Abdominal   Peds  Hematology negative hematology ROS (+)   Anesthesia Other Findings   Reproductive/Obstetrics negative OB ROS                            Anesthesia Physical Anesthesia Plan  ASA: III  Anesthesia Plan: General   Post-op Pain Management:    Induction: Intravenous, Rapid sequence and Cricoid pressure planned  PONV Risk Score and Plan: 4 or greater and Ondansetron, Midazolam and Dexamethasone  Airway Management Planned: Oral ETT  Additional Equipment:   Intra-op Plan:   Post-operative Plan: Extubation in OR and Possible Post-op intubation/ventilation  Informed Consent: I have reviewed the patients History and Physical, chart, labs and discussed the procedure including the risks, benefits and alternatives for the proposed anesthesia with the patient or authorized representative who has indicated his/her understanding and acceptance.     Dental advisory  given  Plan Discussed with: CRNA  Anesthesia Plan Comments:        Anesthesia Quick Evaluation

## 2019-07-24 NOTE — Progress Notes (Signed)
Patient Demographics:    Hailey Randolph, is a 69 y.o. female, DOB - Nov 29, 1950, [id number]  Admit date - 07/20/2019   Admitting Physician Lynetta Mare, MD  Outpatient Primary MD for the patient is Asencion Noble, MD  LOS - 3   Chief Complaint  Patient presents with  . Abdominal Pain        Subjective:    Hailey Randolph today has no fevers, ,  No chest pain,    -Slightly anxious about the surgery today, -Abdominal discomfort manageable   Assessment  & Plan :    Principal Problem:   SBO (small bowel obstruction) (HCC) Active Problems:   Nausea with vomiting   Breast cancer [facility])  Brief Summary 69 y.o. 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 Arnoldo Morale appreciated -24 HIAA to assess for carcinoid disease,  ---CEA levels pending -Further management per surgical team team -No emesis at this time, okay to have sips and chips  --Exploratory laparotomy planned for Monday, 07/24/2019   2)HTN-BP stable, actually on the softer side  -stopped Losartan HCTZ due to n.p.o. status with risk for dehydration and AKI  3)Hypothyroidism--- TSH 0.6, continue with thyroxine  4)Anxiety Disorder/Insomnia--- continue on Ambien and Ativan  5)FEN/Hypokalemia-- Stopped HCTZ, replaced orally and IV.  Received iv magnesium -Continue IV dextrose solution while n.p.o.   Disposition/Need for in-Hospital Stay- patient unable to be discharged at this time due to --SBO with electrolyte abnormalities requiring continuous IV  fluid and IV replacements -Medically Not ready for discharge home due to inability to have oral intake, need for IV fluids -Exploratory laparotomy planned for Monday, 07/24/2019  Code Status : Full code  Family Communication: (patient is alert, awake and coherent) Discussed with sister at bedside  Consults  :  Gen surg  DVT Prophylaxis  :   -  SCDs  --Lovenox on hold onto 07/26/2019 due to exploratory laparotomy  Lab Results  Component Value Date   PLT 287 07/22/2019   Inpatient Medications  Scheduled Meds: . amLODipine  5 mg Oral Daily  . atorvastatin  20 mg Oral QHS  . Chlorhexidine Gluconate Cloth  6 each Topical Once   And  . Chlorhexidine Gluconate Cloth  6 each Topical Once  . Chlorhexidine Gluconate Cloth  6 each Topical Once  . [START ON 07/26/2019] enoxaparin (LOVENOX) injection  40 mg Subcutaneous Q24H  . levothyroxine  75 mcg Oral QAC breakfast  . LORazepam  0.5 mg Intravenous Once  . sodium chloride flush  3 mL Intravenous Once  . sodium chloride flush  3 mL Intravenous Q12H  . zolpidem  5 mg Oral QHS   Continuous Infusions: . sodium chloride    . dextrose 5 % and 0.45 % NaCl with KCl 20 mEq/L 100 mL/hr at 07/24/19 0539  . ertapenem     PRN Meds:.sodium chloride, acetaminophen **OR** acetaminophen, bisacodyl, LORazepam, morphine injection, ondansetron **OR** ondansetron (ZOFRAN) IV, sodium chloride flush   Anti-infectives (  From admission, onward)   Start     Dose/Rate Route Frequency Ordered Stop   07/24/19 0600  ertapenem (INVANZ) 1,000 mg in sodium chloride 0.9 % 100 mL IVPB     1 g 200 mL/hr over 30 Minutes Intravenous On call to O.R. 07/23/19 1223 07/25/19 0559        Objective:   Vitals:   07/23/19 0558 07/23/19 1333 07/23/19 2131 07/24/19 0425  BP: 140/79 119/65 124/67 108/72  Pulse: 65 61 64 64  Resp: 17 17    Temp: 98.2 F (36.8 C) 97.9 F (36.6 C) 98.3 F (36.8 C) 98.3 F (36.8 C)  TempSrc: Oral Oral Oral Oral  SpO2: 97% 96% 96% 97%   Weight:      Height:        Wt Readings from Last 3 Encounters:  07/23/19 84.2 kg  11/02/16 80.3 kg  08/14/16 80.3 kg     Intake/Output Summary (Last 24 hours) at 07/24/2019 0935 Last data filed at 07/24/2019 0900 Gross per 24 hour  Intake 0 ml  Output 300 ml  Net -300 ml   Physical Exam  Gen:- Awake Alert,  In no apparent distress  HEENT:- [place].AT, No sclera icterus Neck-Supple Neck,No JVD,.  Lungs-  CTAB , fair symmetrical air movement CV- S1, S2 normal, regular  Abd-diminished B.Sounds, Abd Soft, mild abdominal discomfort without rebound or guarding ,    Extremity/Skin:- No  edema, pedal pulses present  Psych-affect is slightly anxious, oriented x3 Neuro-no new focal deficits, no tremors   Data Review:   Micro Results Recent Results (from the past 240 hour(s))  SARS CORONAVIRUS 2 (TAT 6-24 HRS) Nasopharyngeal Nasopharyngeal Swab     Status: None   Collection Time: 07/20/19 10:46 PM   Specimen: Nasopharyngeal Swab  Result Value Ref Range Status   SARS Coronavirus 2 NEGATIVE NEGATIVE Final    Comment: (NOTE) SARS-CoV-2 target nucleic acids are NOT DETECTED. The SARS-CoV-2 RNA is generally detectable in upper and lower respiratory specimens during the acute phase of infection. Negative results do not preclude SARS-CoV-2 infection, do not rule out co-infections with other pathogens, and should not be used as the sole basis for treatment or other patient management decisions. Negative results must be combined with clinical observations, patient history, and epidemiological information. The expected result is Negative. Fact Sheet for Patients: SugarRoll.be Fact Sheet for Healthcare Providers: https://www.woods-mathews.com/ This test is not yet approved or cleared by the [place] FDA and  has been authorized for detection and/or diagnosis of SARS-CoV-2 by FDA under an Emergency Use Authorization (EUA). This EUA will remain   in effect (meaning this test can be used) for the duration of the COVID-19 declaration under Section 56 4(b)(1) of the Act, 21 U.S.C. section 360bbb-3(b)(1), unless the authorization is terminated or revoked sooner. Performed at [facility], [place] [address] [place], [place] [address]   Surgical pcr screen     Status: None   Collection Time: 07/23/19  7:32 PM   Specimen: Nasal Mucosa; Nasal Swab  Result Value Ref Range Status   MRSA, PCR NEGATIVE NEGATIVE Final   Staphylococcus aureus NEGATIVE NEGATIVE Final    Comment: (NOTE) The Xpert SA Assay (FDA approved for NASAL specimens in patients 69 years of age and older), is one component of a comprehensive surveillance program. It is not intended to diagnose infection nor to guide or monitor treatment. Performed at [facility], [address], [place], [place] [address]     Radiology Reports CT  Abdomen Pelvis W Contrast  Result Date: 07/20/2019 CLINICAL DATA:  Abdominal pain emesis for 5 weeks EXAM: CT ABDOMEN AND PELVIS WITH CONTRAST TECHNIQUE: Multidetector CT imaging of the abdomen and pelvis was performed using the standard protocol following bolus administration of intravenous contrast. CONTRAST:  15mL OMNIPAQUE IOHEXOL 300 MG/ML  SOLN COMPARISON:  HIDA scan 07/27/2016 FINDINGS: Lower chest: Lung bases are clear. Normal heart size. No pericardial effusion. Hepatobiliary: No focal liver abnormality is seen. No gallstones, gallbladder wall thickening, or biliary dilatation. Pancreas: Unremarkable. No pancreatic ductal dilatation or surrounding inflammatory changes. Spleen: Normal in size without focal abnormality. Adrenals/Urinary Tract: Kidneys are somewhat diminutive. Few parapelvic cysts are noted for instance a 1.6 cm cyst in the interpolar left kidney (2/34). No suspicious renal lesions. No urolithiasis or hydronephrosis. Urinary bladder is unremarkable. Stomach/Bowel: Distal esophagus and stomach are free of acute abnormality.  Duodenum takes a normal sweep across the abdomen. Small air and fluid-filled duodenal diverticular noted about the level distal abrupt transition at the level of the ligament of Treitz (2/36) and just distally associated with the proximal jejunum (2/29). There is a long segment of mid to distal small bowel demonstrating air and fluid distention. A proximal transition point is not clearly identified however there is focal distal transition at the level of a hyperenhancing nodule with in the low midline abdomen (2/72). Small bowel distal to this focus is largely decompressed. A geometric radiodensity within the distended small bowel segment (2/44) may reflect a medication or other ingested material. Cecum is slightly displaced towards the midline abdomen. Appendix is not well seen. No colonic dilatation or wall thickening. Vascular/Lymphatic: The aorta is normal caliber. No suspicious or enlarged lymph nodes in the included lymphatic chains. Reproductive: Uterus is surgically absent. No concerning adnexal lesions. Other: No free fluid. No free air. No organized abscess or collection. No bowel containing hernia. Musculoskeletal: Multilevel degenerative changes are present in the imaged portions of the spine. No acute osseous abnormality or suspicious osseous lesion. Mild dextrocurvature of the spine with an apex at the thoracolumbar junction. IMPRESSION: Long segment of mid to distal small bowel demonstrating air and fluid distention with focal distal transition point at the level of a hyperenhancing 0.7 x 1.5 cm nodule in the low midline abdomen (2/71). Findings are concerning for at least partial small bowel obstruction. The appearance of this lesion is worrisome for possible malignancy either primary or metastatic. Electronically Signed   By: Lovena Le M.D.   On: 07/20/2019 22:50   DG Chest Port 1 View  Result Date: 07/23/2019 CLINICAL DATA:  Preoperative EXAM: PORTABLE CHEST 1 VIEW COMPARISON:  None.  FINDINGS: The heart size and mediastinal contours are within normal limits. Both lungs are clear. The visualized skeletal structures are unremarkable. IMPRESSION: No acute abnormality of the lungs in AP portable examination. Electronically Signed   By: Eddie Candle M.D.   On: 07/23/2019 13:43     CBC Recent Labs  Lab 07/20/19 2035 07/21/19 0503 07/22/19 0520  WBC 9.0 10.5 8.0  HGB 14.3 13.6 13.1  HCT 42.2 40.7 39.4  PLT 287 216 287  MCV 93.8 92.9 93.4  MCH 31.8 31.1 31.0  MCHC 33.9 33.4 33.2  RDW 12.7 12.7 12.6    Chemistries  Recent Labs  Lab 07/20/19 2035 07/21/19 0503 07/22/19 0521 07/23/19 0523  NA 139 139 139 139  K 3.0* 2.9* 3.1* 3.8  CL 101 103 106 109  CO2 26 24 25 22   GLUCOSE 104* 79 111* 101*  BUN 18 15 9  6*  CREATININE 1.02* 0.85 0.75 0.73  CALCIUM 9.0 8.8* 8.6* 8.2*  AST 19  --   --   --   ALT 24  --   --   --   ALKPHOS 78  --   --   --   BILITOT 0.8  --   --   --    ------------------------------------------------------------------------------------------------------------------ No results for input(s): CHOL, HDL, LDLCALC, TRIG, CHOLHDL, LDLDIRECT in the last 72 hours.  No results found for: HGBA1C ------------------------------------------------------------------------------------------------------------------ No results for input(s): TSH, T4TOTAL, T3FREE, THYROIDAB in the last 72 hours.  Invalid input(s): FREET3 ------------------------------------------------------------------------------------------------------------------ No results for input(s): VITAMINB12, FOLATE, FERRITIN, TIBC, IRON, RETICCTPCT in the last 72 hours.  Coagulation profile No results for input(s): INR, PROTIME in the last 168 hours.  No results for input(s): DDIMER in the last 72 hours.  Cardiac Enzymes No results for input(s): CKMB, TROPONINI, MYOGLOBIN in the last 168 hours.  Invalid input(s):  CK ------------------------------------------------------------------------------------------------------------------ No results found for: BNP   Hailey Randolph M.D on 07/24/2019 at 9:35 AM  Go to [email or web address] - for contact info  [facility] - Office  [phone]

## 2019-07-24 NOTE — Telephone Encounter (Signed)
Pt's sister called to cancel apt for pt this week with EG. Pt had surgery with Dr. Arnoldo Morale today 07/24/19 to remove a blockage. Pt will call back when she is able to come in. When I checked to see the apt date and time, the appointment had already been cancelled.

## 2019-07-24 NOTE — Anesthesia Postprocedure Evaluation (Signed)
Anesthesia Post Note  Patient: Hailey Randolph  Procedure(s) Performed: EXPLORATORY LAPAROTOMY (N/A Abdomen) PARTIAL SMALL BOWEL RESECTION (N/A Abdomen)  Patient location during evaluation: PACU Anesthesia Type: General Level of consciousness: awake and alert Pain management: pain level controlled Vital Signs Assessment: post-procedure vital signs reviewed and stable Respiratory status: spontaneous breathing Cardiovascular status: stable Postop Assessment: no apparent nausea or vomiting Anesthetic complications: no     Last Vitals:  Vitals:   07/24/19 1135 07/24/19 1320  BP: 120/70   Pulse: 68   Resp: 16   Temp:  36.5 C  SpO2: 95%     Last Pain:  Vitals:   07/24/19 1320  TempSrc:   PainSc: 4                  Everette Rank

## 2019-07-24 NOTE — Anesthesia Procedure Notes (Signed)
Procedure Name: Intubation Date/Time: 07/24/2019 12:02 PM Performed by: Georgeanne Nim, CRNA Pre-anesthesia Checklist: Patient identified, Emergency Drugs available, Suction available, Patient being monitored and Timeout performed Patient Re-evaluated:Patient Re-evaluated prior to induction Oxygen Delivery Method: Circle system utilized Preoxygenation: Pre-oxygenation with 100% oxygen Induction Type: IV induction, Rapid sequence and Cricoid Pressure applied Laryngoscope Size: Mac and 4 Grade View: Grade I Tube type: Oral Tube size: 7.0 mm Number of attempts: 1 Airway Equipment and Method: Stylet Placement Confirmation: ETT inserted through vocal cords under direct vision,  positive ETCO2,  CO2 detector and breath sounds checked- equal and bilateral Secured at: 20 cm Tube secured with: Tape Dental Injury: Teeth and Oropharynx as per pre-operative assessment

## 2019-07-25 LAB — CBC
HCT: 39 % (ref 36.0–46.0)
Hemoglobin: 12.9 g/dL (ref 12.0–15.0)
MCH: 31 pg (ref 26.0–34.0)
MCHC: 33.1 g/dL (ref 30.0–36.0)
MCV: 93.8 fL (ref 80.0–100.0)
Platelets: 260 10*3/uL (ref 150–400)
RBC: 4.16 MIL/uL (ref 3.87–5.11)
RDW: 12.4 % (ref 11.5–15.5)
WBC: 14.4 10*3/uL — ABNORMAL HIGH (ref 4.0–10.5)
nRBC: 0 % (ref 0.0–0.2)

## 2019-07-25 LAB — BASIC METABOLIC PANEL
Anion gap: 9 (ref 5–15)
BUN: 9 mg/dL (ref 8–23)
CO2: 23 mmol/L (ref 22–32)
Calcium: 8.2 mg/dL — ABNORMAL LOW (ref 8.9–10.3)
Chloride: 106 mmol/L (ref 98–111)
Creatinine, Ser: 0.78 mg/dL (ref 0.44–1.00)
GFR calc Af Amer: >60 mL/min (ref >60)
GFR calc non Af Amer: >60 mL/min (ref >60)
Glucose, Bld: 111 mg/dL — ABNORMAL HIGH (ref 70–99)
Potassium: 3.7 mmol/L (ref 3.5–5.1)
Sodium: 138 mmol/L (ref 135–145)

## 2019-07-25 LAB — MAGNESIUM: Magnesium: 1.5 mg/dL — ABNORMAL LOW (ref 1.7–2.4)

## 2019-07-25 LAB — CEA: CEA: 1.9 ng/mL (ref 0.0–4.7)

## 2019-07-25 LAB — PHOSPHORUS: Phosphorus: 2.9 mg/dL (ref 2.5–4.6)

## 2019-07-25 MED ORDER — KETOROLAC TROMETHAMINE 15 MG/ML IJ SOLN
15.0000 mg | Freq: Four times a day (QID) | INTRAMUSCULAR | Status: AC
  Administered 2019-07-25: 15 mg via INTRAVENOUS
  Filled 2019-07-25: qty 1

## 2019-07-25 MED ORDER — ALVIMOPAN 12 MG PO CAPS
12.0000 mg | ORAL_CAPSULE | Freq: Two times a day (BID) | ORAL | Status: DC
  Administered 2019-07-25: 12 mg via ORAL
  Filled 2019-07-25: qty 1

## 2019-07-25 MED ORDER — KCL IN DEXTROSE-NACL 20-5-0.45 MEQ/L-%-% IV SOLN: INTRAVENOUS | Status: DC

## 2019-07-25 MED ORDER — SIMETHICONE 80 MG PO CHEW: 40.0000 mg | CHEWABLE_TABLET | Freq: Four times a day (QID) | ORAL | Status: DC | PRN

## 2019-07-25 MED ORDER — MAGNESIUM SULFATE 2 GM/50ML IV SOLN
2.0000 g | Freq: Once | INTRAVENOUS | Status: AC
  Administered 2019-07-25: 2 g via INTRAVENOUS
  Filled 2019-07-25: qty 50

## 2019-07-25 MED ORDER — LORAZEPAM 2 MG/ML IJ SOLN
1.0000 mg | INTRAMUSCULAR | Status: DC | PRN
  Administered 2019-07-26: 09:00:00 1 mg via INTRAVENOUS
  Filled 2019-07-25: qty 1

## 2019-07-25 MED ORDER — HYDROMORPHONE HCL 1 MG/ML IJ SOLN: 1.0000 mg | INTRAMUSCULAR | Status: DC | PRN

## 2019-07-25 MED ORDER — KETOROLAC TROMETHAMINE 15 MG/ML IJ SOLN: 15.0000 mg | Freq: Four times a day (QID) | INTRAMUSCULAR | Status: DC | PRN

## 2019-07-25 MED ORDER — HYDROCODONE-ACETAMINOPHEN 5-325 MG PO TABS
1.0000 | ORAL_TABLET | ORAL | Status: DC | PRN
  Administered 2019-07-25: 1 via ORAL
  Administered 2019-07-25 (×2): 2 via ORAL
  Administered 2019-07-26 (×3): 1 via ORAL
  Administered 2019-07-27: 2 via ORAL
  Filled 2019-07-25: qty 2
  Filled 2019-07-25: qty 1
  Filled 2019-07-25: qty 2
  Filled 2019-07-25 (×3): qty 1
  Filled 2019-07-25: qty 2

## 2019-07-25 NOTE — Progress Notes (Signed)
Patient Demographics:    Hailey Randolph, is a 69 y.o. female, DOB - Oct 12, 1950, [id number]  Admit date - 07/20/2019   Admitting Physician Lynetta Mare, MD  Outpatient Primary MD for the patient is Asencion Noble, MD  LOS - 4   Chief Complaint  Patient presents with  . Abdominal Pain        Subjective:    Hailey Randolph today has no fevers, ,  No chest pain,    Complains of back discomfort  -Tolerating liquid diet well +ve flatus but no BM  --As per general surgeon Dr. Arnoldo Morale patient is being transferred to the surgical service as of 07/25/2019, [facility] will sign off -Please reconsult transfer to telemetry service if needed   Assessment  & Plan :    Principal Problem:   SBO (small bowel obstruction) ([place]) Active Problems:   Nausea with vomiting   Breast cancer [facility])  Brief Summary 69 y.o. 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 Arnoldo Morale appreciated -24 HIAA to assess for carcinoid disease is pending,  ---CEA levels pending -Further management per surgical team team --Status post exploratory laparotomy on 07/24/2019 -Tolerating liquid diet -As per general surgeon Dr. Arnoldo Morale patient is being transferred to the surgical service as of 07/25/2019, [facility] will sign off -Please reconsult transfer to telemetry service if needed   2)HTN-BP stable, continue amlodipine 5 mg daily -stopped Losartan HCTZ due to n.p.o. status with risk for dehydration and  AKI  3)Hypothyroidism--- TSH 0.6, continue with thyroxine  4)Anxiety Disorder/Insomnia--- continue on Ambien and Ativan  5)FEN/Hypokalemia-- Stopped HCTZ due to hypokalemia , replaced potassium orally and IV.  Received iv magnesium -Currently on liquid diet   Disposition-- s/p -Exploratory laparotomy planned for Monday, 07/24/2019 --As per general surgeon Dr. Arnoldo Morale patient is being transferred to the surgical service as of 07/25/2019, [facility] will sign off -Please reconsult transfer to telemetry service if needed  Code Status : Full code  Family Communication: (patient is alert, awake and coherent) Discussed with sister Pamala Hurry at bedside  Consults  :  Gen surg  DVT Prophylaxis  :   -  SCDs  --Lovenox on hold onto 07/26/2019 due to exploratory laparotomy  Lab Results  Component Value Date   PLT 260 07/25/2019   Inpatient Medications  Scheduled Meds: . alvimopan  12 mg Oral BID  . amLODipine  5 mg Oral Daily  . atorvastatin  20 mg Oral QHS  . Chlorhexidine Gluconate Cloth  6 each Topical Daily  . [START ON 07/26/2019] enoxaparin (LOVENOX) injection  40 mg Subcutaneous Q24H  . levothyroxine  75 mcg Oral QAC breakfast  . sodium chloride flush  3 mL Intravenous Once  .  sodium chloride flush  3 mL Intravenous Q12H  . zolpidem  5 mg Oral QHS   Continuous Infusions: . sodium chloride     PRN Meds:.sodium chloride, acetaminophen **OR** acetaminophen, HYDROcodone-acetaminophen, HYDROmorphone (DILAUDID) injection, [COMPLETED] ketorolac **FOLLOWED BY** ketorolac, LORazepam, ondansetron **OR** ondansetron (ZOFRAN) IV, simethicone   Anti-infectives (From admission, onward)   Start     Dose/Rate Route Frequency Ordered Stop   07/24/19 0600  ertapenem (INVANZ) 1,000 mg in sodium chloride 0.9 % 100 mL IVPB     1 g 200 mL/hr over 30 Minutes Intravenous On call to O.R. 07/23/19 1223 07/24/19 1233        Objective:   Vitals:   07/24/19 2100 07/25/19 0100 07/25/19  0500 07/25/19 0610  BP: 125/69 123/70  132/68  Pulse: 85 78  75  Resp: 16 17  17   Temp: 99 F (37.2 C) 98.9 F (37.2 C)  98.2 F (36.8 C)  TempSrc: Oral Oral  Oral  SpO2: 94% 95%  97%  Weight:   85.4 kg   Height:        Wt Readings from Last 3 Encounters:  07/25/19 85.4 kg  11/02/16 80.3 kg  08/14/16 80.3 kg     Intake/Output Summary (Last 24 hours) at 07/25/2019 0755 Last data filed at 07/25/2019 0600 Gross per 24 hour  Intake 2000 ml  Output 1325 ml  Net 675 ml   Physical Exam  Gen:- Awake Alert,  In no apparent distress  HEENT:- [place].AT, No sclera icterus Neck-Supple Neck,No JVD,.  Lungs-  CTAB , fair symmetrical air movement CV- S1, S2 normal, regular  Abd-diminished B.Sounds, Abd Soft, appropriate postop tenderness, postsurgical wound clean dry intact Extremity/Skin:- No  edema, pedal pulses present  Psych-affect is slightly anxious, oriented x3 Neuro-no new focal deficits, no tremors   Data Review:   Micro Results Recent Results (from the past 240 hour(s))  SARS CORONAVIRUS 2 (TAT 6-24 HRS) Nasopharyngeal Nasopharyngeal Swab     Status: None   Collection Time: 07/20/19 10:46 PM   Specimen: Nasopharyngeal Swab  Result Value Ref Range Status   SARS Coronavirus 2 NEGATIVE NEGATIVE Final    Comment: (NOTE) SARS-CoV-2 target nucleic acids are NOT DETECTED. The SARS-CoV-2 RNA is generally detectable in upper and lower respiratory specimens during the acute phase of infection. Negative results do not preclude SARS-CoV-2 infection, do not rule out co-infections with other pathogens, and should not be used as the sole basis for treatment or other patient management decisions. Negative results must be combined with clinical observations, patient history, and epidemiological information. The expected result is Negative. Fact Sheet for Patients: SugarRoll.be Fact Sheet for Healthcare  Providers: https://www.woods-mathews.com/ This test is not yet approved or cleared by the [place] FDA and  has been authorized for detection and/or diagnosis of SARS-CoV-2 by FDA under an Emergency Use Authorization (EUA). This EUA will remain  in effect (meaning this test can be used) for the duration of the COVID-19 declaration under Section 56 4(b)(1) of the Act, 21 U.S.C. section 360bbb-3(b)(1), unless the authorization is terminated or revoked sooner. Performed at [facility], [place] [address], [place], [place] [address]   Surgical pcr screen     Status: None   Collection Time: 07/23/19  7:32 PM   Specimen: Nasal Mucosa; Nasal Swab  Result Value Ref Range Status   MRSA, PCR NEGATIVE NEGATIVE Final   Staphylococcus aureus NEGATIVE NEGATIVE Final    Comment: (NOTE) The Xpert SA Assay (FDA approved for NASAL specimens  in patients 14 years of age and older), is one component of a comprehensive surveillance program. It is not intended to diagnose infection nor to guide or monitor treatment. Performed at [facility], [address] [place], [place] [address]     Radiology Reports CT Abdomen Pelvis W Contrast  Result Date: 07/20/2019 CLINICAL DATA:  Abdominal pain emesis for 5 weeks EXAM: CT ABDOMEN AND PELVIS WITH CONTRAST TECHNIQUE: Multidetector CT imaging of the abdomen and pelvis was performed using the standard protocol following bolus administration of intravenous contrast. CONTRAST:  138mL OMNIPAQUE IOHEXOL 300 MG/ML  SOLN COMPARISON:  HIDA scan 07/27/2016 FINDINGS: Lower chest: Lung bases are clear. Normal heart size. No pericardial effusion. Hepatobiliary: No focal liver abnormality is seen. No gallstones, gallbladder wall thickening, or biliary dilatation. Pancreas: Unremarkable. No pancreatic ductal dilatation or surrounding inflammatory changes. Spleen: Normal in size without focal abnormality. Adrenals/Urinary Tract: Kidneys are somewhat diminutive.  Few parapelvic cysts are noted for instance a 1.6 cm cyst in the interpolar left kidney (2/34). No suspicious renal lesions. No urolithiasis or hydronephrosis. Urinary bladder is unremarkable. Stomach/Bowel: Distal esophagus and stomach are free of acute abnormality. Duodenum takes a normal sweep across the abdomen. Small air and fluid-filled duodenal diverticular noted about the level distal abrupt transition at the level of the ligament of Treitz (2/36) and just distally associated with the proximal jejunum (2/29). There is a long segment of mid to distal small bowel demonstrating air and fluid distention. A proximal transition point is not clearly identified however there is focal distal transition at the level of a hyperenhancing nodule with in the low midline abdomen (2/72). Small bowel distal to this focus is largely decompressed. A geometric radiodensity within the distended small bowel segment (2/44) may reflect a medication or other ingested material. Cecum is slightly displaced towards the midline abdomen. Appendix is not well seen. No colonic dilatation or wall thickening. Vascular/Lymphatic: The aorta is normal caliber. No suspicious or enlarged lymph nodes in the included lymphatic chains. Reproductive: Uterus is surgically absent. No concerning adnexal lesions. Other: No free fluid. No free air. No organized abscess or collection. No bowel containing hernia. Musculoskeletal: Multilevel degenerative changes are present in the imaged portions of the spine. No acute osseous abnormality or suspicious osseous lesion. Mild dextrocurvature of the spine with an apex at the thoracolumbar junction. IMPRESSION: Long segment of mid to distal small bowel demonstrating air and fluid distention with focal distal transition point at the level of a hyperenhancing 0.7 x 1.5 cm nodule in the low midline abdomen (2/71). Findings are concerning for at least partial small bowel obstruction. The appearance of this lesion is  worrisome for possible malignancy either primary or metastatic. Electronically Signed   By: Lovena Le M.D.   On: 07/20/2019 22:50   DG Chest Port 1 View  Result Date: 07/23/2019 CLINICAL DATA:  Preoperative EXAM: PORTABLE CHEST 1 VIEW COMPARISON:  None. FINDINGS: The heart size and mediastinal contours are within normal limits. Both lungs are clear. The visualized skeletal structures are unremarkable. IMPRESSION: No acute abnormality of the lungs in AP portable examination. Electronically Signed   By: Eddie Candle M.D.   On: 07/23/2019 13:43     CBC Recent Labs  Lab 07/20/19 2035 07/21/19 0503 07/22/19 0520 07/25/19 0510  WBC 9.0 10.5 8.0 14.4*  HGB 14.3 13.6 13.1 12.9  HCT 42.2 40.7 39.4 39.0  PLT 287 216 287 260  MCV 93.8 92.9 93.4 93.8  MCH 31.8 31.1 31.0 31.0  MCHC  33.9 33.4 33.2 33.1  RDW 12.7 12.7 12.6 12.4    Chemistries  Recent Labs  Lab 07/20/19 2035 07/21/19 0503 07/22/19 0521 07/23/19 0523 07/25/19 0510  NA 139 139 139 139 138  K 3.0* 2.9* 3.1* 3.8 3.7  CL 101 103 106 109 106  CO2 26 24 25 22 23   GLUCOSE 104* 79 111* 101* 111*  BUN 18 15 9  6* 9  CREATININE 1.02* 0.85 0.75 0.73 0.78  CALCIUM 9.0 8.8* 8.6* 8.2* 8.2*  MG  --   --   --   --  1.5*  AST 19  --   --   --   --   ALT 24  --   --   --   --   ALKPHOS 78  --   --   --   --   BILITOT 0.8  --   --   --   --    ------------------------------------------------------------------------------------------------------------------ No results for input(s): CHOL, HDL, LDLCALC, TRIG, CHOLHDL, LDLDIRECT in the last 72 hours.  No results found for: HGBA1C ------------------------------------------------------------------------------------------------------------------ No results for input(s): TSH, T4TOTAL, T3FREE, THYROIDAB in the last 72 hours.  Invalid input(s): FREET3 ------------------------------------------------------------------------------------------------------------------ No results for input(s):  VITAMINB12, FOLATE, FERRITIN, TIBC, IRON, RETICCTPCT in the last 72 hours.  Coagulation profile No results for input(s): INR, PROTIME in the last 168 hours.  No results for input(s): DDIMER in the last 72 hours.  Cardiac Enzymes No results for input(s): CKMB, TROPONINI, MYOGLOBIN in the last 168 hours.  Invalid input(s): CK ------------------------------------------------------------------------------------------------------------------ No results found for: BNP   Roxan Hockey M.D on 07/25/2019 at 7:55 AM  Go to [email or web address] - for contact info  [facility] - Office  [phone]

## 2019-07-25 NOTE — Progress Notes (Signed)
1 Day Post-Op  Subjective: No significant complaints.  Minimal incisional pain.  Has passed flatus but no bowel movement yet.  Tolerating clear liquid diet well.  Foley has been removed.  Objective: Vital signs in last 24 hours: Temp:  [97.7 F (36.5 C)-99 F (37.2 C)] 98 F (36.7 C) (03/02 0900) Pulse Rate:  [68-85] 68 (03/02 0900) Resp:  [13-21] 18 (03/02 0900) BP: (106-136)/(67-78) 118/70 (03/02 0900) SpO2:  [89 %-100 %] 95 % (03/02 0900) Weight:  [85.4 kg] 85.4 kg (03/02 0500) Last BM Date: 07/24/19  Intake/Output from previous day: 03/01 0701 - 03/02 0700 In: 2000 [I.V.:2000] Out: 1325 [Urine:1275; Blood:50] Intake/Output this shift: No intake/output data recorded.  General appearance: alert, cooperative and no distress Resp: clear to auscultation bilaterally Cardio: regular rate and rhythm, S1, S2 normal, no murmur, click, rub or gallop GI: Soft, occasional bowel sounds appreciated.  Dressing dry and intact.  Lab Results:  Recent Labs    07/25/19 0510  WBC 14.4*  HGB 12.9  HCT 39.0  PLT 260   BMET Recent Labs    07/23/19 0523 07/25/19 0510  NA 139 138  K 3.8 3.7  CL 109 106  CO2 22 23  GLUCOSE 101* 111*  BUN 6* 9  CREATININE 0.73 0.78  CALCIUM 8.2* 8.2*   PT/INR No results for input(s): LABPROT, INR in the last 72 hours.  Studies/Results: DG Chest Port 1 View  Result Date: 07/23/2019 CLINICAL DATA:  Preoperative EXAM: PORTABLE CHEST 1 VIEW COMPARISON:  None. FINDINGS: The heart size and mediastinal contours are within normal limits. Both lungs are clear. The visualized skeletal structures are unremarkable. IMPRESSION: No acute abnormality of the lungs in AP portable examination. Electronically Signed   By: Eddie Candle M.D.   On: 07/23/2019 13:43    Anti-infectives: Anti-infectives (From admission, onward)   Start     Dose/Rate Route Frequency Ordered Stop   07/24/19 0600  ertapenem (INVANZ) 1,000 mg in sodium chloride 0.9 % 100 mL IVPB     1  g 200 mL/hr over 30 Minutes Intravenous On call to O.R. 07/23/19 1223 07/24/19 1233      Assessment/Plan: s/p Procedure(s): EXPLORATORY LAPAROTOMY PARTIAL SMALL BOWEL RESECTION Impression: Stable on postoperative day 1.  Slight hypomagnesemia is noted and this will be addressed.  Will adjust IV fluids.  Advance to full liquid diet.  Will get patient mobilized.  LOS: 4 days    Aviva Signs 07/25/2019

## 2019-07-26 ENCOUNTER — Ambulatory Visit: Payer: Medicare Other | Admitting: Nurse Practitioner

## 2019-07-26 LAB — BASIC METABOLIC PANEL
Anion gap: 9 (ref 5–15)
BUN: 10 mg/dL (ref 8–23)
CO2: 25 mmol/L (ref 22–32)
Calcium: 8.2 mg/dL — ABNORMAL LOW (ref 8.9–10.3)
Chloride: 105 mmol/L (ref 98–111)
Creatinine, Ser: 0.73 mg/dL (ref 0.44–1.00)
GFR calc Af Amer: >60 mL/min (ref >60)
GFR calc non Af Amer: >60 mL/min (ref >60)
Glucose, Bld: 90 mg/dL (ref 70–99)
Potassium: 3.6 mmol/L (ref 3.5–5.1)
Sodium: 139 mmol/L (ref 135–145)

## 2019-07-26 LAB — CBC
HCT: 39.4 % (ref 36.0–46.0)
Hemoglobin: 12.7 g/dL (ref 12.0–15.0)
MCH: 31.7 pg (ref 26.0–34.0)
MCHC: 32.2 g/dL (ref 30.0–36.0)
MCV: 98.3 fL (ref 80.0–100.0)
Platelets: 222 10*3/uL (ref 150–400)
RBC: 4.01 MIL/uL (ref 3.87–5.11)
RDW: 13.1 % (ref 11.5–15.5)
WBC: 10.4 10*3/uL (ref 4.0–10.5)
nRBC: 0 % (ref 0.0–0.2)

## 2019-07-26 LAB — PHOSPHORUS: Phosphorus: 2.7 mg/dL (ref 2.5–4.6)

## 2019-07-26 LAB — MAGNESIUM: Magnesium: 2.1 mg/dL (ref 1.7–2.4)

## 2019-07-26 NOTE — Care Management Important Message (Signed)
Important Message  Patient Details  Name: Hailey Randolph MRN: [id number] Date of Birth: 07/02/1950   Medicare Important Message Given:  Yes     Tommy Medal 07/26/2019, 1:46 PM

## 2019-07-26 NOTE — Progress Notes (Signed)
2 Days Post-Op  Subjective: Patient having minimal incisional pain.  Had 6 bowel movements yesterday evening.  No nausea or vomiting is noted.  Objective: Vital signs in last 24 hours: Temp:  [98.1 F (36.7 C)-98.9 F (37.2 C)] 98.1 F (36.7 C) (03/03 0523) Pulse Rate:  [69-74] 74 (03/03 0523) Resp:  [16-20] 16 (03/03 0523) BP: (99-120)/(59-77) 120/77 (03/03 0523) SpO2:  [94 %-96 %] 96 % (03/03 0523) Weight:  [87.8 kg] 87.8 kg (03/03 0500) Last BM Date: 07/25/19  Intake/Output from previous day: 03/02 0701 - 03/03 0700 In: 961.8 [P.O.:120; I.V.:841.8] Out: 100 [Urine:100] Intake/Output this shift: No intake/output data recorded.  General appearance: alert, cooperative and no distress Resp: clear to auscultation bilaterally Cardio: regular rate and rhythm, S1, S2 normal, no murmur, click, rub or gallop GI: soft, incision healing well.  Active bowel sounds appreciated.  Lab Results:  Recent Labs    07/25/19 0510 07/26/19 0548  WBC 14.4* 10.4  HGB 12.9 12.7  HCT 39.0 39.4  PLT 260 222   BMET Recent Labs    07/25/19 0510 07/26/19 0548  NA 138 139  K 3.7 3.6  CL 106 105  CO2 23 25  GLUCOSE 111* 90  BUN 9 10  CREATININE 0.78 0.73  CALCIUM 8.2* 8.2*   PT/INR No results for input(s): LABPROT, INR in the last 72 hours.  Studies/Results: No results found.  Anti-infectives: Anti-infectives (From admission, onward)   Start     Dose/Rate Route Frequency Ordered Stop   07/24/19 0600  ertapenem (INVANZ) 1,000 mg in sodium chloride 0.9 % 100 mL IVPB     1 g 200 mL/hr over 30 Minutes Intravenous On call to O.R. 07/23/19 1223 07/24/19 1233      Assessment/Plan: s/p Procedure(s): EXPLORATORY LAPAROTOMY PARTIAL SMALL BOWEL RESECTION Impression: Stable on postoperative day 2.  Hypomagnesemia resolved.  Bowel function has returned.  Will advance to regular diet.  Final pathology pending.  Anticipate discharge in next 24 to 48 hours.  LOS: 5 days    Aviva Signs 07/26/2019

## 2019-07-27 LAB — SURGICAL PATHOLOGY

## 2019-07-27 MED ORDER — HYDROCODONE-ACETAMINOPHEN 5-325 MG PO TABS: 1.0000 | ORAL_TABLET | ORAL | 0 refills | Status: DC | PRN

## 2019-07-27 NOTE — Discharge Instructions (Signed)
Open Small Bowel Resection, Care After This sheet gives you information about how to care for yourself after your procedure. Your health care provider may also give you more specific instructions. If you have problems or questions, contact your health care provider. What can I expect after the procedure? After the procedure, it is common to have:  Pain in your abdomen, especially along your incision. You will be given pain medicines to control this.  Tiredness. This is a normal part of the recovery process. Your energy level will return to normal over the next several weeks.  Constipation. You may be given a stool softener to help prevent this. Follow these instructions at home: Medicines  Take over-the-counter and prescription medicines only as told by your health care provider.  Ask your health care provider if the medicine prescribed to you requires you to avoid driving or using heavy machinery.  If you were prescribed an antibiotic medicine, use it as told by your health care provider. Do not stop using the antibiotic even if you start to feel better. Activity  Return to your normal activities as told by your health care provider. Ask your health care provider what activities are safe for you.  Do not lift anything that is heavier than 10 lb (4.5 kg), or the limit that you are told, until your health care provider says that it is safe.  Rest as told by your health care provider.  Avoid sitting for a long time without moving. Get up to take short walks every 1-2 hours. This is important to improve blood flow and breathing. Ask for help if you feel weak or unsteady.  Avoid activities that take a lot of effort for as long as told by your health care provider. Incision care   Follow instructions from your health care provider about how to take care of your incision. Make sure you: ? Wash your hands with soap and water before and after you change your bandage (dressing). If soap and  water are not available, use hand sanitizer. ? Change your dressing as told by your health care provider. ? Leave stitches (sutures), skin glue, or adhesive strips in place. These skin closures may need to stay in place for 2 weeks or longer. If adhesive strip edges start to loosen and curl up, you may trim the loose edges. Do not remove adhesive strips completely unless your health care provider tells you to do that.  Check your incision area every day for signs of infection. Check for: ? Redness, swelling, or pain. ? Fluid or blood. ? Warmth. ? Pus or a bad smell.  Do not take baths, swim, or use a hot tub until your health care provider approves. Ask your health care provider if you may take showers. You may only be allowed to take sponge baths. Managing constipation You may need to take these actions to prevent or treat constipation:  Drink enough fluid to keep your urine pale yellow.  Take over-the-counter or prescription medicines.  Eat foods that are high in fiber, such as beans, whole grains, and fresh fruits and vegetables.  Limit foods that are high in fat and processed sugars, such as fried or sweet foods.  General instructions  Continue to practice deep breathing and coughing. If it hurts to cough, try holding a pillow against your abdomen as you cough.  If you were given a breathing device (incentive spirometer), continue to use it as told by your health care provider.  Wear compression stockings   as told by your health care provider. These stockings help to prevent blood clots and reduce swelling in your legs.  Do not use any products that contain nicotine or tobacco, such as cigarettes, e-cigarettes, and chewing tobacco. These can delay incision healing after surgery. If you need help quitting, ask your health care provider.  Keep all follow-up visits as told by your health care provider. This is important. Contact a health care provider if:  You have pain that is not  relieved with medicine.  You do not feel like eating.  You feel nauseous or you vomit.  You have constipation that is not relieved with prescribed stool softeners.  You have a fever.  You have any of these signs of infection: ? Redness, swelling, or pain around your incision. ? Fluid or blood coming from your incision. ? Warmth coming from your incision. ? Pus or a bad smell coming from your incision.  Your incision breaks open.  You have bleeding from the rectum.  You have swelling of your abdomen. Get help right away if:  Your pain gets worse, even after you take pain medicine.  Your legs or arms become painful, red, or swollen.  You have chest pain.  You have trouble breathing.  You cannot have a bowel movement or pass gas.  You have persistent nausea or vomiting.  You have increased abdominal pain. These symptoms may represent a serious problem that is an emergency. Do not wait to see if the symptoms will go away. Get medical help right away. Call your local emergency services (911 in the [place] Do not drive yourself to the hospital. Summary  After the procedure, it is common to have pain in your abdomen and incision areas, tiredness, and constipation.  Take over-the-counter and prescription medicines only as told by your health care provider.  Return to your normal activities as told by your health care provider. Ask your health care provider what activities are safe for you.  Follow instructions from your health care provider about how to take care of your incision area.  Contact a health care provider if you have pain that is not relieved with medicine or if you have any signs of infection. This information is not intended to replace advice given to you by your health care provider. Make sure you discuss any questions you have with your health care provider. Document Revised: 10/13/2018 Document Reviewed: 10/13/2018 Elsevier Patient Education  2020 [employer]  [employer]  

## 2019-07-27 NOTE — Progress Notes (Signed)
Discharge instructions reviewed with patient. Given AVS. Prescription sent to her pharmacy, states she is aware it is ready for pickup. Verbalized understanding of instructions and follow-up. IV site removed, site within normal limits. Patient left floor in stable condition via w/c accompanied by nursing staff. Discharged home.

## 2019-07-27 NOTE — Discharge Summary (Signed)
Physician Discharge Summary  Patient ID: Hailey Randolph MRN: [id number] DOB/AGE: January 30, 1951 69 y.o.  Admit date: 07/20/2019 Discharge date: 07/27/2019  Admission Diagnoses: Small bowel obstruction  Discharge Diagnoses: Same, neuroendocrine tumor of small bowel Hypertension, anxiety, hypothyroidism, hyperlipidemia  Discharged Condition: good  Hospital Course: Patient is a 69 year old white female who presented to the emergency room on 07/20/2019 with nausea, vomiting, and intermittent diarrhea.  CT scan of the abdomen revealed a partial small bowel obstruction with a questionable mass in the small bowel.  She subsequently underwent an exploratory laparotomy, partial small bowel resection on 07/24/2019.  At the time of surgery, a significantly narrowed ring like mass was noted in the distal small bowel.  Final pathology came back as neuroendocrine tumor with negative lymph node metastasis.  A urine 5-HIAA has been ordered but the results are not back yet.  The patient's postoperative course was for the most part unremarkable.  Her diet was advanced of difficulty once her bowel function returned.  She had mild hypomagnesemia and hypokalemia in the perioperative and this was addressed.  She is being discharged home in good and improving condition on 07/27/2019.  Treatments: surgery: Exploratory laparotomy, partial small bowel resection on 07/24/2019  Discharge Exam: Blood pressure 122/74, pulse 72, temperature 98 F (36.7 C), temperature source Oral, resp. rate 18, height 5\' 3"  (1.6 m), weight 87.1 kg, SpO2 97 %. General appearance: alert, cooperative and no distress Resp: clear to auscultation bilaterally Cardio: regular rate and rhythm, S1, S2 normal, no murmur, click, rub or gallop GI: Soft, bowel sounds active.  Incision healing well.  No drainage present.  Disposition: Discharge disposition: 01-Home or Self Care       Discharge Instructions    Diet - low sodium heart healthy   Complete by:  As directed    Increase activity slowly   Complete by: As directed      Allergies as of 07/27/2019   No Known Allergies     Medication List    TAKE these medications   atorvastatin 20 MG tablet Commonly known as: LIPITOR Take 20 mg by mouth at bedtime.   diphenoxylate-atropine 2.5-0.025 MG tablet Commonly known as: LOMOTIL Take 1 tablet by mouth every 4 (four) hours as needed.   hydrochlorothiazide 25 MG tablet Commonly known as: HYDRODIURIL Take 25 mg by mouth daily.   HYDROcodone-acetaminophen 5-325 MG tablet Commonly known as: NORCO/VICODIN Take 1 tablet by mouth every 4 (four) hours as needed for moderate pain.   levothyroxine 75 MCG tablet Commonly known as: SYNTHROID Take 75 mcg by mouth daily before breakfast.   LORazepam 1 MG tablet Commonly known as: ATIVAN Take 1 mg by mouth daily as needed for anxiety.   losartan 100 MG tablet Commonly known as: COZAAR Take 100 mg by mouth daily.   zolpidem 10 MG tablet Commonly known as: AMBIEN Take 10 mg by mouth at bedtime as needed for sleep.      Follow-up Information    Aviva Signs, MD. Schedule an appointment as soon as possible for a visit on 08/03/2019.   Specialty: General Surgery Contact information: [address] [id number] [phone]           Signed: Aviva Signs 07/27/2019, 8:46 AM

## 2019-07-28 LAB — 5 HIAA, QUANTITATIVE, URINE, 24 HOUR
5-HIAA, Ur: 3.4 mg/L
5-HIAA,Quant.,24 Hr Urine: 3.4 mg/24 hr (ref 0.0–14.9)

## 2019-08-03 ENCOUNTER — Encounter: Payer: Self-pay | Admitting: General Surgery

## 2019-08-03 ENCOUNTER — Other Ambulatory Visit: Payer: Self-pay

## 2019-08-03 ENCOUNTER — Ambulatory Visit (INDEPENDENT_AMBULATORY_CARE_PROVIDER_SITE_OTHER): Payer: Self-pay | Admitting: General Surgery

## 2019-08-03 VITALS — BP 101/69 | HR 100 | Temp 98.2°F | Resp 12 | Ht 62.0 in | Wt 174.0 lb

## 2019-08-03 DIAGNOSIS — Z09 Encounter for follow-up examination after completed treatment for conditions other than malignant neoplasm: Secondary | ICD-10-CM

## 2019-08-03 NOTE — Progress Notes (Signed)
Subjective:     Hailey Randolph  Patient is here for postoperative visit, status post partial small bowel resection for carcinoid tumor of the small bowel.  Patient is still having episodes of loose stool, but it seems improving.  She denies any fever, chills, or blood per rectum.  She has no heart palpitations. Objective:    BP 101/69   Pulse 100   Temp 98.2 F (36.8 C) (Oral)   Resp 12   Ht 5\' 2"  (1.575 m)   Wt 174 lb (78.9 kg)   SpO2 96%   BMI 31.83 kg/m   General:  alert, cooperative and no distress  Abdomen is soft, incision healing well.  Staples removed, Steri-Strips applied. Final pathology revealed carcinoid tumor of the small bowel without lymph node involvement.  Urine HIAA was negative.     Assessment:    Doing well postoperatively. Some loose bowel movements with frequency    Plan:   I told the patient to start taking probiotics through yogurt.  Will not prescribe Imodium yet.  We will see again in 2 weeks for follow-up.  Will refer to oncology for follow-up.

## 2019-08-04 DIAGNOSIS — Z23 Encounter for immunization: Secondary | ICD-10-CM | POA: Diagnosis not present

## 2019-08-08 ENCOUNTER — Encounter (HOSPITAL_COMMUNITY): Payer: Self-pay | Admitting: *Deleted

## 2019-08-08 NOTE — Progress Notes (Signed)
Oncology Navigator Note:  Patient was referred to our office for carcinoid tumor of colon.  I called patient today to introduce myself and provide information in how I will be involved with her care.  I provided information on her first visit and what to expect.  I made sure patient was aware of appointment time and directions to the cancer center.  My phone number was given so that she can call me with any questions or concerns.  She voices appreciation and understanding.

## 2019-08-09 ENCOUNTER — Encounter (HOSPITAL_COMMUNITY): Payer: Self-pay | Admitting: *Deleted

## 2019-08-09 ENCOUNTER — Other Ambulatory Visit: Payer: Self-pay

## 2019-08-10 ENCOUNTER — Encounter (HOSPITAL_COMMUNITY): Payer: Self-pay | Admitting: Hematology

## 2019-08-10 ENCOUNTER — Inpatient Hospital Stay (HOSPITAL_COMMUNITY): Payer: Medicare Other | Attending: Hematology | Admitting: Hematology

## 2019-08-10 ENCOUNTER — Inpatient Hospital Stay (HOSPITAL_COMMUNITY): Payer: Medicare Other

## 2019-08-10 VITALS — BP 124/77 | HR 70 | Temp 96.9°F | Resp 16 | Wt 174.5 lb

## 2019-08-10 DIAGNOSIS — Z803 Family history of malignant neoplasm of breast: Secondary | ICD-10-CM

## 2019-08-10 DIAGNOSIS — C7A8 Other malignant neuroendocrine tumors: Secondary | ICD-10-CM

## 2019-08-10 DIAGNOSIS — R197 Diarrhea, unspecified: Secondary | ICD-10-CM | POA: Insufficient documentation

## 2019-08-10 DIAGNOSIS — Z1231 Encounter for screening mammogram for malignant neoplasm of breast: Secondary | ICD-10-CM

## 2019-08-10 DIAGNOSIS — D3A019 Benign carcinoid tumor of the small intestine, unspecified portion: Secondary | ICD-10-CM | POA: Insufficient documentation

## 2019-08-10 DIAGNOSIS — Z8371 Family history of colonic polyps: Secondary | ICD-10-CM

## 2019-08-10 DIAGNOSIS — C7A019 Malignant carcinoid tumor of the small intestine, unspecified portion: Secondary | ICD-10-CM

## 2019-08-10 DIAGNOSIS — N62 Hypertrophy of breast: Secondary | ICD-10-CM

## 2019-08-10 DIAGNOSIS — C7A098 Malignant carcinoid tumors of other sites: Secondary | ICD-10-CM

## 2019-08-10 NOTE — Progress Notes (Signed)
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  HISTORY OF PRESENTING ILLNESS:  Hailey Randolph 69 y.o. female is seen in consultation today at the request of Dr. 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  Patient reported having lower abdominal pain towards the end of January and had lost about 15 to 20 pounds.  She came to the ER on 07/20/2019 with nausea, vomiting, abdominal pain.  CT scan done in the ER showed findings consistent with small bowel obstruction.  No focal liver lesions were seen.  She underwent exploratory laparotomy and resection of small bowel on 07/24/2019.  Pathology showed grade 1 well-differentiated neuroendocrine tumor, 2 cm in size, unifocal, invading into the serosal surface, margins negative, no lymphovascular invasion, 2 tumor deposits measuring 0.4 cm and 0.5 cm.  None of the 18 lymph nodes removed were positive for cancer.  Mitotic index was 1/10 hpf.  Ki-67 was 1%.  Pathological staging is PT4PN0.  She is recovering well from surgery.  Denies any fevers, night sweats.  No flushing or wheezing was reported.  Appetite and energy levels are 75%.  She has some mild dry cough.  Patient reports having loose stools twice in the morning every day since surgery.  She does have a history of left breast lumpectomy done in 2018 which showed usual ductal hyperplasia.  Family history significant for mother with breast cancer diagnosed at age 6.  She is retired and had done office work in the past.  No exposure to chemicals.  No smoking history.  She is widowed.  She is accompanied by her sister today.  MEDICAL HISTORY:  Past Medical  History:  Diagnosis Date  . Adenomatous colon polyp   . Anxiety   . HTN (hypertension)   . Hyperlipidemia   . Hypothyroidism     SURGICAL HISTORY: Past Surgical History:  Procedure Laterality Date  . ABDOMINAL HYSTERECTOMY    . APPENDECTOMY    . BIOPSY  08/14/2016   Procedure: BIOPSY;  Surgeon: Daneil Dolin, MD;  Location[facility];  Service: Endoscopy;;  gastric  . BOWEL RESECTION N/A 07/24/2019   Procedure: PARTIAL SMALL BOWEL RESECTION;  Surgeon: Aviva Signs, MD;  Location: [facility];  Service: General;  Laterality: N/A;  . BREAST EXCISIONAL BIOPSY Left 10/30/2016  . BREAST EXCISIONAL BIOPSY Right 10/30/2016  . COLONOSCOPY  April 2007   Adenomatous polyp, pedunculated, at 30 cm. Scattered left-sided diverticula  . COLONOSCOPY N/A 01/17/2016   Dr. Gala Romney: 5 mm polyp in the cecum, tubular adenoma. Scattered small and large mouth diverticula in the sigmoid colon. Next colonoscopy 5 years.  . ESOPHAGOGASTRODUODENOSCOPY N/A 08/14/2016   Procedure: ESOPHAGOGASTRODUODENOSCOPY (EGD);  Surgeon: Daneil Dolin, MD;  Location: [facility];  Service: Endoscopy;  Laterality: N/A;  215  . hysterectomy     complete  . LAPAROTOMY N/A 07/24/2019   Procedure: EXPLORATORY LAPAROTOMY;  Surgeon: Aviva Signs, MD;  Location: [facility];  Service: General;  Laterality: N/A;  . RADIOACTIVE SEED GUIDED EXCISIONAL BREAST BIOPSY Bilateral 11/02/2016   Procedure: BILATERAL RADIOACTIVE SEED GUIDED EXCISIONAL BREAST BIOPSY LEFT BREAST 2 SEEDS RIGHT BREAST 1 SEED;  Surgeon: Rolm Bookbinder, MD;  Location: [facility];  Service: General;  Laterality: Bilateral;  .  TONSILLECTOMY      SOCIAL HISTORY: Social History   Socioeconomic History  . Marital status: Widowed    Spouse name: Not on file  . Number of children: 2  . Years of education: Not on file  . Highest education level: Not on file  Occupational History  . Occupation: retired    Comment: Research officer, trade union division  Tobacco Use  .  Smoking status: Never Smoker  . Smokeless tobacco: Never Used  Substance and Sexual Activity  . Alcohol use: No  . Drug use: No  . Sexual activity: Not Currently  Other Topics Concern  . Not on file  Social History Narrative  . Not on file   Social Determinants of Health   Financial Resource Strain: Low Risk   . Difficulty of Paying Living Expenses: Not hard at all  Food Insecurity: No Food Insecurity  . Worried About Charity fundraiser in the Last Year: Never true  . Ran Out of Food in the Last Year: Never true  Transportation Needs: No Transportation Needs  . Lack of Transportation (Medical): No  . Lack of Transportation (Non-Medical): No  Physical Activity: Insufficiently Active  . Days of Exercise per Week: 3 days  . Minutes of Exercise per Session: 30 min  Stress: No Stress Concern Present  . Feeling of Stress : Only a little  Social Connections: Somewhat Isolated  . Frequency of Communication with Friends and Family: More than three times a week  . Frequency of Social Gatherings with Friends and Family: More than three times a week  . Attends Religious Services: More than 4 times per year  . Active Member of Clubs or Organizations: No  . Attends Archivist Meetings: Never  . Marital Status: Widowed  Intimate Partner Violence: Not At Risk  . Fear of Current or Ex-Partner: No  . Emotionally Abused: No  . Physically Abused: No  . Sexually Abused: No    FAMILY HISTORY: Family History  Problem Relation Age of Onset  . Colon polyps Mother   . Breast cancer Mother 29  . Heart attack Father 34       deceased  . Arthritis Sister   . Liver disease Neg Hx     ALLERGIES:  has No Known Allergies.  MEDICATIONS:  Current Outpatient Medications  Medication Sig Dispense Refill  . atorvastatin (LIPITOR) 20 MG tablet Take 20 mg by mouth at bedtime.     . hydrochlorothiazide (HYDRODIURIL) 25 MG tablet Take 25 mg by mouth daily.    Marland Kitchen HYDROcodone-acetaminophen  (NORCO/VICODIN) 5-325 MG tablet Take 1 tablet by mouth every 4 (four) hours as needed for moderate pain. (Patient not taking: Reported on 08/09/2019) 30 tablet 0  . levothyroxine (SYNTHROID, LEVOTHROID) 75 MCG tablet Take 75 mcg by mouth daily before breakfast.     . LORazepam (ATIVAN) 1 MG tablet Take 1 mg by mouth daily as needed for anxiety.     Marland Kitchen losartan (COZAAR) 100 MG tablet Take 100 mg by mouth daily.    Marland Kitchen zolpidem (AMBIEN) 10 MG tablet Take 5 mg by mouth at bedtime. Pt takes 1/2 tablets at bedtime     No current facility-administered medications for this visit.    REVIEW OF SYSTEMS:   Constitutional: Denies fevers, chills or abnormal night sweats Eyes: Denies blurriness of vision, double vision or watery eyes Ears, nose, mouth, throat, and face: Denies mucositis or sore throat Respiratory: Denies any wheezing or dyspnea.  Dry cough present. Cardiovascular: Denies palpitation,  chest discomfort or lower extremity swelling Gastrointestinal:  Denies nausea, heartburn.  Loose stools twice daily since surgery. Skin: Denies abnormal skin rashes Lymphatics: Denies new lymphadenopathy or easy bruising Neurological:Denies numbness, tingling or new weaknesses Behavioral/Psych: Mood is stable, no new changes  All other systems were reviewed with the patient and are negative.  PHYSICAL EXAMINATION: ECOG PERFORMANCE STATUS: 1 - Symptomatic but completely ambulatory  Vitals:   08/10/19 1245  BP: 124/77  Pulse: 70  Resp: 16  Temp: (!) 96.9 F (36.1 C)  SpO2: 97%   Filed Weights   08/10/19 1245  Weight: 174 lb 8 oz (79.2 kg)    GENERAL:alert, no distress and comfortable SKIN: skin color, texture, turgor are normal, no rashes or significant lesions EYES: normal, conjunctiva are pink and non-injected, sclera clear OROPHARYNX:no exudate, no erythema and lips, buccal mucosa, and tongue normal  NECK: supple, thyroid normal size, non-tender, without nodularity LYMPH:  no palpable  lymphadenopathy in the cervical, axillary or inguinal LUNGS: clear to auscultation and percussion with normal breathing effort HEART: regular rate & rhythm and no murmurs and no lower extremity edema ABDOMEN: Midline scar is healing well.  There is a slight gaping lateral to the umbilicus. Musculoskeletal:no cyanosis of digits and no clubbing  PSYCH: alert & oriented x 3 with fluent speech NEURO: no focal motor/sensory deficits  LABORATORY DATA:  I have reviewed the data as listed Lab Results  Component Value Date   WBC 10.4 07/26/2019   HGB 12.7 07/26/2019   HCT 39.4 07/26/2019   MCV 98.3 07/26/2019   PLT 222 07/26/2019     Chemistry      Component Value Date/Time   NA 139 07/26/2019 0548   K 3.6 07/26/2019 0548   CL 105 07/26/2019 0548   CO2 25 07/26/2019 0548   BUN 10 07/26/2019 0548   CREATININE 0.73 07/26/2019 0548      Component Value Date/Time   CALCIUM 8.2 (L) 07/26/2019 0548   ALKPHOS 78 07/20/2019 2035   AST 19 07/20/2019 2035   ALT 24 07/20/2019 2035   BILITOT 0.8 07/20/2019 2035       RADIOGRAPHIC STUDIES: I have personally reviewed the radiological images as listed and agreed with the findings in the report. CT Abdomen Pelvis W Contrast  Result Date: 07/20/2019 CLINICAL DATA:  Abdominal pain emesis for 5 weeks EXAM: CT ABDOMEN AND PELVIS WITH CONTRAST TECHNIQUE: Multidetector CT imaging of the abdomen and pelvis was performed using the standard protocol following bolus administration of intravenous contrast. CONTRAST:  14m OMNIPAQUE IOHEXOL 300 MG/ML  SOLN COMPARISON:  HIDA scan 07/27/2016 FINDINGS: Lower chest: Lung bases are clear. Normal heart size. No pericardial effusion. Hepatobiliary: No focal liver abnormality is seen. No gallstones, gallbladder wall thickening, or biliary dilatation. Pancreas: Unremarkable. No pancreatic ductal dilatation or surrounding inflammatory changes. Spleen: Normal in size without focal abnormality. Adrenals/Urinary Tract:  Kidneys are somewhat diminutive. Few parapelvic cysts are noted for instance a 1.6 cm cyst in the interpolar left kidney (2/34). No suspicious renal lesions. No urolithiasis or hydronephrosis. Urinary bladder is unremarkable. Stomach/Bowel: Distal esophagus and stomach are free of acute abnormality. Duodenum takes a normal sweep across the abdomen. Small air and fluid-filled duodenal diverticular noted about the level distal abrupt transition at the level of the ligament of Treitz (2/36) and just distally associated with the proximal jejunum (2/29). There is a long segment of mid to distal small bowel demonstrating air and fluid distention. A proximal transition point is not clearly identified however  there is focal distal transition at the level of a hyperenhancing nodule with in the low midline abdomen (2/72). Small bowel distal to this focus is largely decompressed. A geometric radiodensity within the distended small bowel segment (2/44) may reflect a medication or other ingested material. Cecum is slightly displaced towards the midline abdomen. Appendix is not well seen. No colonic dilatation or wall thickening. Vascular/Lymphatic: The aorta is normal caliber. No suspicious or enlarged lymph nodes in the included lymphatic chains. Reproductive: Uterus is surgically absent. No concerning adnexal lesions. Other: No free fluid. No free air. No organized abscess or collection. No bowel containing hernia. Musculoskeletal: Multilevel degenerative changes are present in the imaged portions of the spine. No acute osseous abnormality or suspicious osseous lesion. Mild dextrocurvature of the spine with an apex at the thoracolumbar junction. IMPRESSION: Long segment of mid to distal small bowel demonstrating air and fluid distention with focal distal transition point at the level of a hyperenhancing 0.7 x 1.5 cm nodule in the low midline abdomen (2/71). Findings are concerning for at least partial small bowel obstruction.  The appearance of this lesion is worrisome for possible malignancy either primary or metastatic. Electronically Signed   By: Lovena Le M.D.   On: 07/20/2019 22:50   DG Chest Port 1 View  Result Date: 07/23/2019 CLINICAL DATA:  Preoperative EXAM: PORTABLE CHEST 1 VIEW COMPARISON:  None. FINDINGS: The heart size and mediastinal contours are within normal limits. Both lungs are clear. The visualized skeletal structures are unremarkable. IMPRESSION: No acute abnormality of the lungs in [facility] portable examination. Electronically Signed   By: Eddie Candle M.D.   On: 07/23/2019 13:43    ASSESSMENT & PLAN:  Carcinoid tumor of small intestine 1.  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 -Presentation with nausea, abdominal pain and vomiting on 07/20/2019, CT scan of the abdomen and pelvis showed small bowel obstruction.  No focal liver lesions. -Exploratory laparotomy and resection of small bowel on 07/24/2019. -We reviewed pathology report which showed grade 1 well-differentiated neuroendocrine tumor, 2 cm, unifocal, mitotic index 1/10 hpf, Ki-67-1%, tumor invades into the serosal surface, margins negative, no LVI, 2 tumor deposits measuring 0.4 cm and 0.5 cm, 0/18 involved lymph nodes, PT4PN0. -She has recovered well from surgery.  She denies any nausea, vomiting or abdominal pain. -I have recommended 68 gallium dotatate PET CT scan. -I have reviewed her 24-hour urine 5-HIAA which was negative. -We will order serum chromogranin A. -I will see her back in a month for follow-up.  2.  Diarrhea: -She reported about 2 soft bowel movements every day in the mornings since surgery. -She was told to use Imodium as needed if it gets any worse.  3.  Left breast usual ductal hyperplasia: -She had a left lumpectomy on 11/02/2016 which showed usual ductal hyperplasia, fibrocystic changes.  No evidence of  malignancy. -Family history significant for mother with breast cancer at age 45. -She had COVID-19 vaccine second dose 1 week ago.  She has not had a mammogram this year.  We will order her screening mammogram in 3 weeks.  Orders Placed This Encounter  Procedures  . NM PET (NETSPOT GA 22 DOTATATE) SKULL BASE TO MID THIGH    Standing Status:   Future    Standing Expiration Date:   10/09/2020    Order Specific Question:   ** REASON FOR EXAM (FREE TEXT)    Answer:  small bowel carcinoid tumor    Order Specific Question:   If indicated for the ordered procedure, I authorize the administration of a radiopharmaceutical per Radiology protocol    Answer:   Yes    Order Specific Question:   Preferred imaging location?    Answer:   [facility]    Order Specific Question:   Radiology Contrast Protocol - do NOT remove file path    Answer:   [email or web address]  . MM 3D SCREEN BREAST BILATERAL    Standing Status:   Future    Standing Expiration Date:   10/09/2020    Order Specific Question:   Reason for Exam (SYMPTOM  OR DIAGNOSIS REQUIRED)    Answer:   breast cancer screening    Order Specific Question:   Preferred imaging location?    Answer:   [facility]  . Chromogranin A    Standing Status:   Future    Number of Occurrences:   1    Standing Expiration Date:   08/09/2020    All questions were answered. The patient knows to call the clinic with any problems, questions or concerns.      Derek Jack, MD 08/10/2019 1:59 PM

## 2019-08-10 NOTE — Assessment & Plan Note (Signed)
1.  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 -Presentation with nausea, abdominal pain and vomiting on 07/20/2019, CT scan of the abdomen and pelvis showed small bowel obstruction.  No focal liver lesions. -Exploratory laparotomy and resection of small bowel on 07/24/2019. -We reviewed pathology report which showed grade 1 well-differentiated neuroendocrine tumor, 2 cm, unifocal, mitotic index 1/10 hpf, Ki-67-1%, tumor invades into the serosal surface, margins negative, no LVI, 2 tumor deposits measuring 0.4 cm and 0.5 cm, 0/18 involved lymph nodes, PT4PN0. -She has recovered well from surgery.  She denies any nausea, vomiting or abdominal pain. -I have recommended 68 gallium dotatate PET CT scan. -I have reviewed her 24-hour urine 5-HIAA which was negative. -We will order serum chromogranin A. -I will see her back in a month for follow-up.  2.  Diarrhea: -She reported about 2 soft bowel movements every day in the mornings since surgery. -She was told to use Imodium as needed if it gets any worse.  3.  Left breast usual ductal hyperplasia: -She had a left lumpectomy on 11/02/2016 which showed usual ductal hyperplasia, fibrocystic changes.  No evidence of malignancy. -Family history significant for mother with breast cancer at age 17. -She had COVID-19 vaccine second dose 1 week ago.  She has not had a mammogram this year.  We will order her screening mammogram in 3 weeks.

## 2019-08-10 NOTE — Patient Instructions (Addendum)
[place] at [facility] Discharge Instructions  You were seen today by Dr. Delton Coombes. He went over your history, family history and how you've been since your surgery. You will have labs drawn before you leave the hospital today.He will schedule you for PET scan. He will see you back in 4 weeks for follow up.   Thank you for choosing [place] at [facility] to provide your oncology and hematology care.  To afford each patient quality time with our provider, please arrive at least 15 minutes before your scheduled appointment time.   If you have a lab appointment with the [facility] please come in thru the  Main Entrance and check in at the main information desk  You need to re-schedule your appointment should you arrive 10 or more minutes late.  We strive to give you quality time with our providers, and arriving late affects you and other patients whose appointments are after yours.  Also, if you no show three or more times for appointments you may be dismissed from the clinic at the providers discretion.     Again, thank you for choosing [facility].  Our hope is that these requests will decrease the amount of time that you wait before being seen by our physicians.       _____________________________________________________________  Should you have questions after your visit to [facility], please contact our office at [phone] between the hours of 8:00 a.m. and 4:30 p.m.  Voicemails left after 4:00 p.m. will not be returned until the following business day.  For prescription refill requests, have your pharmacy contact our office and allow 72 hours.    Cancer Center Support Programs:   > Cancer Support Group  2nd Tuesday of the month 1pm-2pm, Journey Room

## 2019-08-11 ENCOUNTER — Other Ambulatory Visit (HOSPITAL_COMMUNITY): Payer: Self-pay | Admitting: *Deleted

## 2019-08-11 DIAGNOSIS — C7A019 Malignant carcinoid tumor of the small intestine, unspecified portion: Secondary | ICD-10-CM

## 2019-08-11 DIAGNOSIS — C7A098 Malignant carcinoid tumors of other sites: Secondary | ICD-10-CM

## 2019-08-11 LAB — CHROMOGRANIN A

## 2019-08-11 NOTE — Progress Notes (Signed)
Orders placed for repeat chromogranin A.  Patient is aware of new lab appointment.

## 2019-08-14 ENCOUNTER — Other Ambulatory Visit: Payer: Self-pay

## 2019-08-14 ENCOUNTER — Inpatient Hospital Stay (HOSPITAL_COMMUNITY): Payer: Medicare Other

## 2019-08-14 DIAGNOSIS — Z803 Family history of malignant neoplasm of breast: Secondary | ICD-10-CM | POA: Diagnosis not present

## 2019-08-14 DIAGNOSIS — C7A019 Malignant carcinoid tumor of the small intestine, unspecified portion: Secondary | ICD-10-CM

## 2019-08-14 DIAGNOSIS — R197 Diarrhea, unspecified: Secondary | ICD-10-CM | POA: Diagnosis not present

## 2019-08-14 DIAGNOSIS — C7A8 Other malignant neuroendocrine tumors: Secondary | ICD-10-CM | POA: Diagnosis not present

## 2019-08-14 DIAGNOSIS — Z8371 Family history of colonic polyps: Secondary | ICD-10-CM | POA: Diagnosis not present

## 2019-08-14 DIAGNOSIS — N62 Hypertrophy of breast: Secondary | ICD-10-CM | POA: Diagnosis not present

## 2019-08-15 LAB — CHROMOGRANIN A: Chromogranin A (ng/mL): 54.7 ng/mL (ref 0.0–101.8)

## 2019-08-17 ENCOUNTER — Ambulatory Visit (HOSPITAL_COMMUNITY): Payer: Medicare Other

## 2019-08-17 ENCOUNTER — Ambulatory Visit: Payer: Self-pay | Admitting: General Surgery

## 2019-09-04 ENCOUNTER — Ambulatory Visit (HOSPITAL_COMMUNITY)
Admission: RE | Admit: 2019-09-04 | Discharge: 2019-09-04 | Disposition: A | Discharge status: Home / Self Care | Payer: Medicare Other | Source: Ambulatory Visit | Attending: Hematology | Admitting: Hematology

## 2019-09-04 ENCOUNTER — Other Ambulatory Visit: Payer: Self-pay

## 2019-09-04 DIAGNOSIS — C7A019 Malignant carcinoid tumor of the small intestine, unspecified portion: Secondary | ICD-10-CM

## 2019-09-04 MED ORDER — GALLIUM GA 68 DOTATATE IV KIT
4.1800 | PACK | Freq: Once | INTRAVENOUS | Status: AC | PRN
  Administered 2019-09-04: 4.18 via INTRAVENOUS

## 2019-09-07 ENCOUNTER — Inpatient Hospital Stay (HOSPITAL_COMMUNITY): Payer: Medicare Other | Attending: Hematology | Admitting: Hematology

## 2019-09-07 ENCOUNTER — Encounter (HOSPITAL_COMMUNITY): Payer: Self-pay | Admitting: Hematology

## 2019-09-07 ENCOUNTER — Other Ambulatory Visit: Payer: Self-pay

## 2019-09-07 DIAGNOSIS — E039 Hypothyroidism, unspecified: Secondary | ICD-10-CM | POA: Insufficient documentation

## 2019-09-07 DIAGNOSIS — C7B8 Other secondary neuroendocrine tumors: Secondary | ICD-10-CM | POA: Diagnosis not present

## 2019-09-07 DIAGNOSIS — I10 Essential (primary) hypertension: Secondary | ICD-10-CM | POA: Insufficient documentation

## 2019-09-07 DIAGNOSIS — C7A8 Other malignant neuroendocrine tumors: Secondary | ICD-10-CM | POA: Insufficient documentation

## 2019-09-07 DIAGNOSIS — Z803 Family history of malignant neoplasm of breast: Secondary | ICD-10-CM | POA: Diagnosis not present

## 2019-09-07 DIAGNOSIS — Z79899 Other long term (current) drug therapy: Secondary | ICD-10-CM | POA: Insufficient documentation

## 2019-09-07 DIAGNOSIS — R197 Diarrhea, unspecified: Secondary | ICD-10-CM | POA: Insufficient documentation

## 2019-09-07 DIAGNOSIS — D3A019 Benign carcinoid tumor of the small intestine, unspecified portion: Secondary | ICD-10-CM

## 2019-09-07 NOTE — Progress Notes (Signed)
[place], [place] [address]   CLINIC:  Medical Oncology/Hematology  PCP:  Asencion Noble, MD [address] 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  CURRENT THERAPY: Under work-up.  BRIEF ONCOLOGIC HISTORY:  Oncology History   No history exists.     CANCER STAGING: Cancer Staging Carcinoid tumor of small intestine Staging form: Small Intestine - Other Histologies, AJCC 8th Edition - Clinical stage from 08/10/2019: cT4, cN0, cM0 - Unsigned    INTERVAL HISTORY:  Hailey Randolph 69 y.o. 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  Denies any diarrhea, flushing or wheezing.  Appetite and energy levels are 100%.  Loose stools have improved.  She is no longer requiring Imodium.  No abdominal pains reported.  No ER visits or hospitalizations.    REVIEW OF SYSTEMS:  Review of Systems  All other systems reviewed and are negative.    PAST MEDICAL/SURGICAL HISTORY:  Past Medical History:  Diagnosis Date  . Adenomatous colon polyp   . Anxiety   . HTN (hypertension)   . Hyperlipidemia   . Hypothyroidism    Past Surgical History:  Procedure Laterality Date  . ABDOMINAL HYSTERECTOMY    . APPENDECTOMY    . BIOPSY  08/14/2016   Procedure: BIOPSY;  Surgeon: Daneil Dolin, MD;  [facility];  Service: Endoscopy;;  gastric  . BOWEL RESECTION N/A 07/24/2019   Procedure: PARTIAL SMALL BOWEL RESECTION;  Surgeon: Aviva Signs, MD;  Location: [facility];  Service: General;  Laterality: N/A;  . BREAST EXCISIONAL BIOPSY Left 10/30/2016  . BREAST EXCISIONAL BIOPSY Right 10/30/2016  . COLONOSCOPY  April 2007   Adenomatous polyp, pedunculated, at 30 cm. Scattered left-sided diverticula  . COLONOSCOPY N/A 01/17/2016   Dr. Gala Romney: 5 mm polyp in the cecum, tubular adenoma. Scattered small and large mouth  diverticula in the sigmoid colon. Next colonoscopy 5 years.  . ESOPHAGOGASTRODUODENOSCOPY N/A 08/14/2016   Procedure: ESOPHAGOGASTRODUODENOSCOPY (EGD);  Surgeon: Daneil Dolin, MD;  [facility];  Service: Endoscopy;  Laterality: N/A;  215  . hysterectomy     complete  . LAPAROTOMY N/A 07/24/2019   Procedure: EXPLORATORY LAPAROTOMY;  Surgeon: Aviva Signs, MD;  Location: [facility];  Service: General;  Laterality: N/A;  . RADIOACTIVE SEED GUIDED EXCISIONAL BREAST BIOPSY Bilateral 11/02/2016   Procedure: BILATERAL RADIOACTIVE SEED GUIDED EXCISIONAL BREAST BIOPSY LEFT BREAST 2 SEEDS RIGHT BREAST 1 SEED;  Surgeon: Rolm Bookbinder, MD;  Location: [facility];  Service: General;  Laterality: Bilateral;  . TONSILLECTOMY       SOCIAL HISTORY:  Social History   Socioeconomic History  . Marital status: Widowed    Spouse name: Not on file  . Number of children: 2  . Years of education: Not on file  . Highest education level: Not on file  Occupational History  . Occupation: retired    Comment: Research officer, trade union division  Tobacco Use  . Smoking status: Never Smoker  . Smokeless tobacco: Never Used  Substance and Sexual Activity  . Alcohol use: No  . Drug use: No  . Sexual activity: Not Currently  Other Topics Concern  . Not on file  Social History Narrative  . Not on file   Social Determinants of Health   Financial Resource Strain: Low Risk   . Difficulty of Paying Living Expenses: Not hard at all  Food Insecurity: No Food Insecurity  . Worried About Charity fundraiser in the Last Year: Never true  . Ran Out of Food in the Last Year: Never true  Transportation Needs: No Transportation Needs  . Lack of Transportation (Medical): No  . Lack of Transportation (Non-Medical): No  Physical Activity: Insufficiently Active  . Days of Exercise per Week: 3 days  . Minutes of Exercise per Session: 30 min  Stress: No Stress Concern Present  . Feeling of Stress : Only a  little  Social Connections: Somewhat Isolated  . Frequency of Communication with Friends and Family: More than three times a week  . Frequency of Social Gatherings with Friends and Family: More than three times a week  . Attends Religious Services: More than 4 times per year  . Active Member of Clubs or Organizations: No  . Attends Archivist Meetings: Never  . Marital Status: Widowed  Intimate Partner Violence: Not At Risk  . Fear of Current or Ex-Partner: No  . Emotionally Abused: No  . Physically Abused: No  . Sexually Abused: No    FAMILY HISTORY:  Family History  Problem Relation Age of Onset  . Colon polyps Mother   . Breast cancer Mother 26  . Heart attack Father 56       deceased  . Arthritis Sister   . Liver disease Neg Hx     CURRENT MEDICATIONS:  Outpatient Encounter Medications as of 09/07/2019  Medication Sig  . atorvastatin (LIPITOR) 20 MG tablet Take 20 mg by mouth at bedtime.   . hydrochlorothiazide (HYDRODIURIL) 25 MG tablet Take 25 mg by mouth daily.  Marland Kitchen HYDROcodone-acetaminophen (NORCO/VICODIN) 5-325 MG tablet Take 1 tablet by mouth every 4 (four) hours as needed for moderate pain.  Marland Kitchen levothyroxine (SYNTHROID, LEVOTHROID) 75 MCG tablet Take 75 mcg by mouth daily before breakfast.   . LORazepam (ATIVAN) 1 MG tablet Take 1 mg by mouth daily as needed for anxiety.   Marland Kitchen losartan (COZAAR) 100 MG tablet Take 100 mg by mouth daily.  Marland Kitchen zolpidem (AMBIEN) 10 MG tablet Take 5 mg by mouth at bedtime. Pt takes 1/2 tablets at bedtime   No facility-administered encounter medications on file as of 09/07/2019.    ALLERGIES:  No Known Allergies   PHYSICAL EXAM:  ECOG Performance status: 1  Vitals:   09/07/19 0746  BP: 122/87  Pulse: 77  Resp: 16  Temp: (!) 96.9 F (36.1 C)  SpO2: 98%   Filed Weights   09/07/19 0746  Weight: 172 lb (78 kg)    Physical Exam Vitals reviewed.  Constitutional:      Appearance: Normal appearance.  Neurological:      General: No focal deficit present.     Mental Status: She is alert and oriented to person, place, and time.  Psychiatric:        Mood and Affect: Mood normal.        Behavior: Behavior normal.        Thought Content: Thought content normal.        Judgment: Judgment normal.      LABORATORY DATA:  I have reviewed the labs as listed.  CBC    Component Value Date/Time   WBC 10.4 07/26/2019 0548   RBC 4.01 07/26/2019 0548   HGB 12.7 07/26/2019 0548   HCT 39.4 07/26/2019 0548   PLT 222 07/26/2019 0548   MCV 98.3 07/26/2019 0548   MCH 31.7 07/26/2019 0548   MCHC 32.2 07/26/2019  0548   RDW 13.1 07/26/2019 0548   CMP Latest Ref Rng & Units 07/26/2019 07/25/2019 07/23/2019  Glucose 70 - 99 mg/dL 90 111(H) 101(H)  BUN 8 - 23 mg/dL 10 9 6(L)  Creatinine 0.44 - 1.00 mg/dL 0.73 0.78 0.73  Sodium 135 - 145 mmol/L 139 138 139  Potassium 3.5 - 5.1 mmol/L 3.6 3.7 3.8  Chloride 98 - 111 mmol/L 105 106 109  CO2 22 - 32 mmol/L '25 23 22  '$ Calcium 8.9 - 10.3 mg/dL 8.2(L) 8.2(L) 8.2(L)  Total Protein 6.5 - 8.1 g/dL - - -  Total Bilirubin 0.3 - 1.2 mg/dL - - -  Alkaline Phos 38 - 126 U/L - - -  AST 15 - 41 U/L - - -  ALT 0 - 44 U/L - - -       DIAGNOSTIC IMAGING:  I have independently reviewed the scans and discussed with the patient.    ASSESSMENT & PLAN:   Carcinoid tumor of small intestine 1.  Well-differentiated neuroendocrine tumor of the small bowel: -Presentation with abdominal pain, nausea and vomiting on 07/20/2019 with CT scan of the [facility] showing small bowel obstruction. -Exploratory laparotomy and resection of small bowel on 07/24/2019. -Pathology showed grade 1 well-differentiated neuroendocrine tumor, 2 cm, unifocal, mitotic index 1/10 hpf, Ki-67 1%, tumor invades into the serosal surface, margins negative, no LVI, 2 tumor deposits measuring 0.4 cm and 0.5 cm, 0/18 lymph nodes positive, PT4 PN 0. -24-hour urine for 5-hydroxyindoleacetic acid was negative.  Serum chromogranin level  was 54.7. -We reviewed results of the dotatate PET CT scan from 09/04/2019 which showed single focus of radiotracer avid nodularity along the serosal surface of the cecum measuring 8 mm consistent with well-differentiated neuroendocrine tumor metastasis.  No evidence of residual tumor at the anastomosis.  No evidence of liver metastasis.  No distant metastatic disease. -I will seek opinion from Dr. Arnoldo Morale to see if this can be resected.  If not resectable, she will be started on monthly lanreotide injections and followed with serial scans and chromogranin levels. -I will see her back after the follow-up visit with Dr. Arnoldo Morale.  2.  Diarrhea: -She reported some diarrhea immediately after surgery.  This has improved at this time.  She is no longer needing Imodium.  3.  Left breast usual ductal hyperplasia: -She had lumpectomy of the left breast on 11/02/2016 showing usual ductal hyperplasia, fibrocystic changes.  No evidence of malignancy. -Family history of breast cancer in her mother at age 73. -She has mammogram scheduled end of the month.  We will follow up on it.      Orders placed this encounter:  No orders of the defined types were placed in this encounter.  Total time spent 30 minutes with more than 50% of the time spent face-to-face discussing scan results, further treatment plan, counseling and coordination of care.   Derek Jack, MD [place] [phone]

## 2019-09-07 NOTE — Assessment & Plan Note (Addendum)
1.  Well-differentiated neuroendocrine tumor of the small bowel: -Presentation with abdominal pain, nausea and vomiting on 07/20/2019 with CT scan of the [facility] showing small bowel obstruction. -Exploratory laparotomy and resection of small bowel on 07/24/2019. -Pathology showed grade 1 well-differentiated neuroendocrine tumor, 2 cm, unifocal, mitotic index 1/10 hpf, Ki-67 1%, tumor invades into the serosal surface, margins negative, no LVI, 2 tumor deposits measuring 0.4 cm and 0.5 cm, 0/18 lymph nodes positive, PT4 PN 0. -24-hour urine for 5-hydroxyindoleacetic acid was negative.  Serum chromogranin level was 54.7. -We reviewed results of the dotatate PET CT scan from 09/04/2019 which showed single focus of radiotracer avid nodularity along the serosal surface of the cecum measuring 8 mm consistent with well-differentiated neuroendocrine tumor metastasis.  No evidence of residual tumor at the anastomosis.  No evidence of liver metastasis.  No distant metastatic disease. -I will seek opinion from Dr. Arnoldo Morale to see if this can be resected.  If not resectable, she will be started on monthly lanreotide injections and followed with serial scans and chromogranin levels. -I will see her back after the follow-up visit with Dr. Arnoldo Morale.  2.  Diarrhea: -She reported some diarrhea immediately after surgery.  This has improved at this time.  She is no longer needing Imodium.  3.  Left breast usual ductal hyperplasia: -She had lumpectomy of the left breast on 11/02/2016 showing usual ductal hyperplasia, fibrocystic changes.  No evidence of malignancy. -Family history of breast cancer in her mother at age 65. -She has mammogram scheduled end of the month.  We will follow up on it.

## 2019-09-12 ENCOUNTER — Other Ambulatory Visit: Payer: Self-pay

## 2019-09-12 ENCOUNTER — Ambulatory Visit (INDEPENDENT_AMBULATORY_CARE_PROVIDER_SITE_OTHER): Payer: Medicare Other | Admitting: General Surgery

## 2019-09-12 ENCOUNTER — Encounter: Payer: Self-pay | Admitting: General Surgery

## 2019-09-12 VITALS — BP 123/85 | HR 77 | Temp 98.0°F | Resp 12 | Ht 60.0 in | Wt 170.0 lb

## 2019-09-12 DIAGNOSIS — Z09 Encounter for follow-up examination after completed treatment for conditions other than malignant neoplasm: Secondary | ICD-10-CM

## 2019-09-13 NOTE — Progress Notes (Signed)
Subjective:     Hailey Randolph  Patient was referred back to my care by oncology for an abnormal PET scan.  She is status post a partial small bowel resection for neuroendocrine tumor on 07/24/2019.  Recent PET scan reveals an 8 mm tumor implant on the cecum.  Patient has been asymptomatic.  She was referred back to my care for consideration of resection of the tumor implant.  Patient has no symptoms of carcinoid disease. Objective:    BP 123/85   Pulse 77   Temp 98 F (36.7 C) (Oral)   Resp 12   Ht 5' (1.524 m)   Wt 170 lb (77.1 kg)   SpO2 96%   BMI 33.20 kg/m   General:  alert, cooperative and no distress  Abdomen is soft, nontender, nondistended.  Midline surgical scar healing well. PET scan reviewed     Assessment:    Neuroendocrine tumor of small bowel, implant seen on PET scan on the cecum    Plan:   We will discuss further with oncology as patient probably needs a second look operation.  This may require a partial colectomy.  Will decide whether a second look operation should be done here or by surgical oncology at [facility].  She may need a colonoscopy prior to any further exploratory laparotomy.

## 2019-09-18 ENCOUNTER — Telehealth (INDEPENDENT_AMBULATORY_CARE_PROVIDER_SITE_OTHER): Payer: Self-pay | Admitting: General Surgery

## 2019-09-18 DIAGNOSIS — Z09 Encounter for follow-up examination after completed treatment for conditions other than malignant neoplasm: Secondary | ICD-10-CM

## 2019-09-18 NOTE — Telephone Encounter (Signed)
Had phone conversation with patient concerning her neuroendocrine tumor. I will reach out to Dr. Jyl Heinz of surgical oncology at [facility] to get his opinion as to the next step and taking care of this positive PET scan.

## 2019-09-19 ENCOUNTER — Other Ambulatory Visit: Payer: Self-pay

## 2019-09-19 ENCOUNTER — Inpatient Hospital Stay (HOSPITAL_BASED_OUTPATIENT_CLINIC_OR_DEPARTMENT_OTHER): Payer: Medicare Other | Admitting: Hematology

## 2019-09-19 ENCOUNTER — Encounter (HOSPITAL_COMMUNITY): Payer: Self-pay | Admitting: Hematology

## 2019-09-19 VITALS — BP 126/71 | HR 79 | Temp 96.2°F | Resp 20 | Wt 171.1 lb

## 2019-09-19 DIAGNOSIS — E039 Hypothyroidism, unspecified: Secondary | ICD-10-CM | POA: Diagnosis not present

## 2019-09-19 DIAGNOSIS — D3A019 Benign carcinoid tumor of the small intestine, unspecified portion: Secondary | ICD-10-CM

## 2019-09-19 DIAGNOSIS — Z803 Family history of malignant neoplasm of breast: Secondary | ICD-10-CM | POA: Diagnosis not present

## 2019-09-19 DIAGNOSIS — R197 Diarrhea, unspecified: Secondary | ICD-10-CM | POA: Diagnosis not present

## 2019-09-19 DIAGNOSIS — C7B8 Other secondary neuroendocrine tumors: Secondary | ICD-10-CM | POA: Diagnosis not present

## 2019-09-19 DIAGNOSIS — C7A8 Other malignant neuroendocrine tumors: Secondary | ICD-10-CM | POA: Diagnosis not present

## 2019-09-19 DIAGNOSIS — I10 Essential (primary) hypertension: Secondary | ICD-10-CM | POA: Diagnosis not present

## 2019-09-19 NOTE — Assessment & Plan Note (Signed)
1.  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 -Exploratory laparotomy and resection of small bowel on 07/24/2019. -Pathology with grade 1 well-differentiated neuroendocrine tumor, 2 cm, unifocal, mitotic index 1/10 hpf, Ki-67 1%, tumor invades into the serosal surface, margins negative, no LVI, 2 tumor deposits measuring 0.4 cm and 0.5 cm, 0/18 lymph nodes positive, PT4PN0. -24-hour urine for 5-HIAA was negative.  Serum chromogranin was 54.7. -PET scan on 09/04/2019 showed single focus of radiotracer avid nodularity along the serosal surface of the cecum measuring 8 mm consistent with well-differentiated neuroendocrine tumor metastasis.  No evidence of residual tumor at the anastomosis.  No evidence of liver mets.  No evidence of distal disease. -I have discussed this case with Dr. Arnoldo Morale.  Next best step is for resection.  Dr. Arnoldo Morale will also talk to Dr. Crisoforo Oxford at [facility]. -If this tumor is unresectable, will consider lanreotide injections.  I will tentatively see her back in 3 to 4 weeks.  2.  Diarrhea: -She reported some diarrhea immediately after surgery which subsided.  She is no longer requiring Imodium.  3.  Left breast usual ductal hyperplasia: -Lumpectomy of the left breast on 11/02/2016 showing UDH, fibrocystic changes.  No evidence of malignancy. -Family history of breast cancer in the mother at age 20. -She will have mammogram in May.  We will follow up on it.

## 2019-09-19 NOTE — Progress Notes (Signed)
Hailey Randolph, Hailey [place] [id number]   CLINIC:  Medical Oncology/Hematology  PCP:  Asencion Noble, MD 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  CURRENT THERAPY: Plan for surgical resection.  BRIEF ONCOLOGIC HISTORY:  Oncology History   No history exists.     CANCER STAGING: Cancer Staging Carcinoid tumor of small intestine Staging form: Small Intestine - Other Histologies, AJCC 8th Edition - Clinical stage from 08/10/2019: cT4, cN0, cM0 - Unsigned    INTERVAL HISTORY:  [name] 69 y.o. 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  Appetite and energy levels are 100%.  Denies any diarrhea.  Diarrhea has improved since surgery.  Requiring no Imodium.  No abdominal pains reported.  Energy levels are slowly getting back to her baseline.    REVIEW OF SYSTEMS:  Review of Systems  All other systems reviewed and are negative.    PAST MEDICAL/SURGICAL HISTORY:  Past Medical History:  Diagnosis Date  . Adenomatous colon polyp   . Anxiety   . HTN (hypertension)   . Hyperlipidemia   . Hypothyroidism    Past Surgical History:  Procedure Laterality Date  . ABDOMINAL HYSTERECTOMY    . APPENDECTOMY    . BIOPSY  08/14/2016   Procedure: BIOPSY;  Surgeon: Daneil Dolin, MD;  [facility];  Service: Endoscopy;;  gastric  . BOWEL RESECTION N/A 07/24/2019   Procedure: PARTIAL SMALL BOWEL RESECTION;  Surgeon: Aviva Signs, MD;  Location: [facility];  Service: General;  Laterality: N/A;  . BREAST EXCISIONAL BIOPSY Left 10/30/2016  . BREAST EXCISIONAL BIOPSY Right 10/30/2016  . COLONOSCOPY  April 2007   Adenomatous polyp, pedunculated, at 30 cm. Scattered left-sided diverticula  . COLONOSCOPY N/A 01/17/2016   Dr. Gala Romney: 5 mm polyp in the cecum, tubular adenoma. Scattered small and large  mouth diverticula in the sigmoid colon. Next colonoscopy 5 years.  . ESOPHAGOGASTRODUODENOSCOPY N/A 08/14/2016   Procedure: ESOPHAGOGASTRODUODENOSCOPY (EGD);  Surgeon: Daneil Dolin, MD;  [facility];  Service: Endoscopy;  Laterality: N/A;  215  . hysterectomy     complete  . LAPAROTOMY N/A 07/24/2019   Procedure: EXPLORATORY LAPAROTOMY;  Surgeon: Aviva Signs, MD;  Location[facility];  Service: General;  Laterality: N/A;  . RADIOACTIVE SEED GUIDED EXCISIONAL BREAST BIOPSY Bilateral 11/02/2016   Procedure: BILATERAL RADIOACTIVE SEED GUIDED EXCISIONAL BREAST BIOPSY LEFT BREAST 2 SEEDS RIGHT BREAST 1 SEED;  Surgeon: Rolm Bookbinder, MD;  Location: [facility] [place];  Service: General;  Laterality: Bilateral;  . TONSILLECTOMY       SOCIAL HISTORY:  Social History   Socioeconomic History  . Marital status: Widowed    Spouse name: Not on file  . Number of children: 2  . Years of education: Not on file  . Highest education level: Not on file  Occupational History  . Occupation: retired    Comment: Research officer, trade union division  Tobacco Use  . Smoking status: Never Smoker  . Smokeless tobacco: Never Used  Substance and Sexual Activity  . Alcohol use: No  . Drug use: No  . Sexual activity: Not Currently  Other Topics Concern  . Not on file  Social History Narrative  . Not on file   Social Determinants of Health   Financial Resource Strain: Low Risk   . Difficulty of Paying Living Expenses: Not hard at all  Food Insecurity: No Food Insecurity  . Worried About Charity fundraiser in the Last Year: Never true  . Ran Out of Food in the Last Year: Never true  Transportation Needs: No Transportation Needs  . Lack of Transportation (Medical): No  . Lack of Transportation (Non-Medical): No  Physical Activity: Insufficiently Active  . Days of Exercise per Week: 3 days  . Minutes of Exercise per Session: 30 min  Stress: No Stress Concern Present  . Feeling of Stress : Only  a little  Social Connections: Somewhat Isolated  . Frequency of Communication with Friends and Family: More than three times a week  . Frequency of Social Gatherings with Friends and Family: More than three times a week  . Attends Religious Services: More than 4 times per year  . Active Member of Clubs or Organizations: No  . Attends Archivist Meetings: Never  . Marital Status: Widowed  Intimate Partner Violence: Not At Risk  . Fear of Current or Ex-Partner: No  . Emotionally Abused: No  . Physically Abused: No  . Sexually Abused: No    FAMILY HISTORY:  Family History  Problem Relation Age of Onset  . Colon polyps Mother   . Breast cancer Mother 7  . Heart attack Father 76       deceased  . Arthritis Sister   . Liver disease Neg Hx     CURRENT MEDICATIONS:  Outpatient Encounter Medications as of 09/19/2019  Medication Sig  . atorvastatin (LIPITOR) 20 MG tablet Take 20 mg by mouth at bedtime.   . hydrochlorothiazide (HYDRODIURIL) 25 MG tablet Take 25 mg by mouth daily.  Marland Kitchen levothyroxine (SYNTHROID, LEVOTHROID) 75 MCG tablet Take 75 mcg by mouth daily before breakfast.   . losartan (COZAAR) 100 MG tablet Take 100 mg by mouth daily.  Marland Kitchen LORazepam (ATIVAN) 1 MG tablet Take 1 mg by mouth daily as needed for anxiety.   . traMADol (ULTRAM) 50 MG tablet Take 50 mg by mouth every 6 (six) hours as needed.  . zolpidem (AMBIEN) 10 MG tablet Take 5 mg by mouth at bedtime. Pt takes 1/2 tablets at bedtime  . [DISCONTINUED] HYDROcodone-acetaminophen (NORCO/VICODIN) 5-325 MG tablet Take 1 tablet by mouth every 4 (four) hours as needed for moderate pain.   No facility-administered encounter medications on file as of 09/19/2019.    ALLERGIES:  No Known Allergies   PHYSICAL EXAM:  ECOG Performance status: 1  Vitals:   09/19/19 0805  BP: 126/71  Pulse: 79  Resp: 20  Temp: (!) 96.2 F (35.7 C)  SpO2: 98%   Filed Weights   09/19/19 0805  Weight: 171 lb 1.6 oz (77.6 kg)     Physical Exam Vitals reviewed.  Constitutional:      Appearance: Normal appearance.  Neurological:     General: No focal deficit present.     Mental Status: She is alert and oriented to person, place, and time.  Psychiatric:        Mood and Affect: Mood normal.        Behavior: Behavior normal.        Thought Content: Thought content normal.        Judgment: Judgment normal.      LABORATORY DATA:  I have reviewed the labs as listed.  CBC    Component Value Date/Time   WBC 10.4 07/26/2019 0548   RBC 4.01 07/26/2019 0548   HGB 12.7 07/26/2019 0548   HCT 39.4 07/26/2019 0548  PLT 222 07/26/2019 0548   MCV 98.3 07/26/2019 0548   MCH 31.7 07/26/2019 0548   MCHC 32.2 07/26/2019 0548   RDW 13.1 07/26/2019 0548   CMP Latest Ref Rng & Units 07/26/2019 07/25/2019 07/23/2019  Glucose 70 - 99 mg/dL 90 111(H) 101(H)  BUN 8 - 23 mg/dL 10 9 6(L)  Creatinine 0.44 - 1.00 mg/dL 0.73 0.78 0.73  Sodium 135 - 145 mmol/L 139 138 139  Potassium 3.5 - 5.1 mmol/L 3.6 3.7 3.8  Chloride 98 - 111 mmol/L 105 106 109  CO2 22 - 32 mmol/L '25 23 22  '$ Calcium 8.9 - 10.3 mg/dL 8.2(L) 8.2(L) 8.2(L)  Total Protein 6.5 - 8.1 g/dL - - -  Total Bilirubin 0.3 - 1.2 mg/dL - - -  Alkaline Phos 38 - 126 U/L - - -  AST 15 - 41 U/L - - -  ALT 0 - 44 U/L - - -       DIAGNOSTIC IMAGING:  I reviewed the scans.    ASSESSMENT & PLAN:   Carcinoid tumor of small intestine 1.  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 -Exploratory laparotomy and resection of small bowel on 07/24/2019. -Pathology with grade 1 well-differentiated neuroendocrine tumor, 2 cm, unifocal, mitotic index 1/10 hpf, Ki-67 1%, tumor invades into the serosal surface, margins negative, no LVI, 2 tumor deposits measuring 0.4 cm and 0.5 cm, 0/18 lymph nodes positive, PT4PN0. -24-hour urine for 5-HIAA was  negative.  Serum chromogranin was 54.7. -PET scan on 09/04/2019 showed single focus of radiotracer avid nodularity along the serosal surface of the cecum measuring 8 mm consistent with well-differentiated neuroendocrine tumor metastasis.  No evidence of residual tumor at the anastomosis.  No evidence of liver mets.  No evidence of distal disease. -I have discussed this case with Dr. Arnoldo Morale.  Next best step is for resection.  Dr. Arnoldo Morale will also talk to Dr. Crisoforo Oxford at [facility]. -If this tumor is unresectable, will consider lanreotide injections.  I will tentatively see her back in 3 to 4 weeks.  2.  Diarrhea: -She reported some diarrhea immediately after surgery which subsided.  She is no longer requiring Imodium.  3.  Left breast usual ductal hyperplasia: -Lumpectomy of the left breast on 11/02/2016 showing UDH, fibrocystic changes.  No evidence of malignancy. -Family history of breast cancer in the mother at age 78. -She will have mammogram in May.  We will follow up on it.      Orders placed this encounter:  Orders Placed This Encounter  Procedures  . Chromogranin A     Derek Jack, MD [place] [phone]

## 2019-09-19 NOTE — Patient Instructions (Addendum)
[place] at [facility] Discharge Instructions  You were seen today by Dr. Delton Coombes. He went over your recent lab results. He will discuss your case with the surgeon at [facility] and come up with a plan of care for you. He will see you back in 3 weeks for labs and follow up.   Thank you for choosing [place] at [facility] to provide your oncology and hematology care.  To afford each patient quality time with our provider, please arrive at least 15 minutes before your scheduled appointment time.   If you have a lab appointment with the [facility] please come in thru the  Main Entrance and check in at the main information desk  You need to re-schedule your appointment should you arrive 10 or more minutes late.  We strive to give you quality time with our providers, and arriving late affects you and other patients whose appointments are after yours.  Also, if you no show three or more times for appointments you may be dismissed from the clinic at the providers discretion.     Again, thank you for choosing [facility].  Our hope is that these requests will decrease the amount of time that you wait before being seen by our physicians.       _____________________________________________________________  Should you have questions after your visit to [facility], please contact our office at [phone] between the hours of 8:00 a.m. and 4:30 p.m.  Voicemails left after 4:00 p.m. will not be returned until the following business day.  For prescription refill requests, have your pharmacy contact our office and allow 72 hours.    Cancer Center Support Programs:   > Cancer Support Group  2nd Tuesday of the month 1pm-2pm, Journey Room

## 2019-09-20 ENCOUNTER — Ambulatory Visit (HOSPITAL_COMMUNITY)
Admission: RE | Admit: 2019-09-20 | Discharge: 2019-09-20 | Disposition: A | Discharge status: Home / Self Care | Payer: Medicare Other | Source: Ambulatory Visit | Attending: Hematology | Admitting: Hematology

## 2019-09-20 DIAGNOSIS — Z1231 Encounter for screening mammogram for malignant neoplasm of breast: Secondary | ICD-10-CM | POA: Diagnosis not present

## 2019-10-10 ENCOUNTER — Other Ambulatory Visit: Payer: Self-pay

## 2019-10-10 ENCOUNTER — Encounter: Payer: Self-pay | Admitting: General Surgery

## 2019-10-10 ENCOUNTER — Ambulatory Visit (INDEPENDENT_AMBULATORY_CARE_PROVIDER_SITE_OTHER): Payer: Self-pay | Admitting: General Surgery

## 2019-10-10 VITALS — BP 142/81 | HR 82 | Temp 98.1°F | Resp 15 | Ht 62.0 in | Wt 171.0 lb

## 2019-10-10 DIAGNOSIS — Z09 Encounter for follow-up examination after completed treatment for conditions other than malignant neoplasm: Secondary | ICD-10-CM

## 2019-10-10 MED ORDER — METRONIDAZOLE 500 MG PO TABS: 500.0000 mg | ORAL_TABLET | Freq: Three times a day (TID) | ORAL | 0 refills | Status: DC

## 2019-10-10 MED ORDER — NEOMYCIN SULFATE 500 MG PO TABS: 1000.0000 mg | ORAL_TABLET | Freq: Three times a day (TID) | ORAL | 0 refills | Status: DC

## 2019-10-10 MED ORDER — SUPREP BOWEL PREP KIT 17.5-3.13-1.6 GM/177ML PO SOLN: 1.0000 | Freq: Once | ORAL | 0 refills | Status: AC

## 2019-10-10 NOTE — Progress Notes (Signed)
Subjective:     Hailey Randolph  Patient presents to schedule second look operation, partial colectomy. 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  The risks and benefits of the procedure including bleeding, infection, anastomotic leak, and the possibility of finding other tumor implants were fully explained to the patient, who gave informed consent.  Suprep, neomycin, and Flagyl have been ordered for bowel preparation preoperatively.

## 2019-10-10 NOTE — Patient Instructions (Addendum)
Drink one bottle of Suprep at noon, then at 4pm the day before surgery   Open Colectomy An open colectomy is surgery to remove part or all of the large intestine (colon). This procedure may be used to treat several conditions, including:  Inflammation and infection of the colon (diverticulitis).  Tumors or masses in the colon.  Inflammatory bowel disease, such as Crohn disease or ulcerative colitis.  Bleeding from the colon.  Blockage or obstruction of the colon. Tell a health care provider about:  Any allergies you have.  All medicines you are taking, including vitamins, herbs, eye drops, creams, and over-the-counter medicines.  Any problems you or family members have had with anesthetic medicines.  Any blood disorders you have.  Any surgeries you have had.  Any medical conditions you have.  Whether you are pregnant or may be pregnant.  Whether you smoke or use tobacco products. These can affect your body's reaction to anesthesia. What are the risks? Generally, this is a safe procedure. However, problems may occur, including:  Infection.  Bleeding.  Allergic reactions to medicines.  Damage to other structures or organs.  Pneumonia.  The incision opening up.  Tissues from inside the abdomen bulging through the incision (hernia).  Reopening of the colon where it was stitched or stapled together.  A blood clot forming in a vein and traveling to the lungs.  Future blockage of the small intestine from scar tissue. What happens before the procedure? Staying hydrated Follow instructions from your health care provider about hydration, which may include:  Up to 2 hours before the procedure - you may continue to drink clear liquids, such as water, clear fruit juice, black coffee, and plain tea. Eating and drinking restrictions Follow instructions from your health care provider about eating and drinking, which may include:  8 hours before the procedure - stop  eating heavy meals or foods such as meat, fried foods, or fatty foods.  6 hours before the procedure - stop eating light meals or foods, such as toast or cereal.  6 hours before the procedure - stop drinking milk or drinks that contain milk.  2 hours before the procedure - stop drinking clear liquids. Bowel prep In some cases, you may be prescribed an oral bowel prep to clean out your colon. If so:  Take it as told by your health care provider. Starting the day before your procedure, you may need to drink a large amount of medicated liquid. The liquid will cause you to have multiple loose stools until your stool is almost clear or light green.  Follow instructions from your health care provider about eating and drinking restrictions during bowel prep. Medicines  Ask your health care provider about: ? Changing or stopping your regular medicines or vitamins. This is especially important if you are taking diabetes medicines, blood thinners, or vitamin E. ? Taking medicines such as aspirin and ibuprofen. These medicines can thin your blood. Do not take these medicines before your procedure if your health care provider instructs you not to.  If you were prescribed an antibiotic medicine, take it as told by your health care provider. General instructions  Bring loose-fitting, comfortable clothing and slip-on shoes that you can put on without bending over.  Make sure to see your health care provider for any tests that you need before the procedure, such as: ? Blood tests. ? A test to check the heart's rhythm (electrocardiogram, ECG). ? A CT scan of your abdomen. ? Urine tests. ?  Colonoscopy.  Plan to have someone take you home from the hospital or clinic.  Arrange for someone to help you with your activities during your recovery. What happens during the procedure?  To reduce your risk of infection: ? Your health care team will wash or sanitize their hands. ? Your skin will be washed  with soap. ? Hair may be removed from the surgical area.  An IV tube will be inserted into one of your veins. The tube will be used to give you medicines and fluids.  You will be given a medicine to make you fall asleep (general anesthetic). You may also be given a medicine to help you relax (sedative).  Small monitors will be connected to your body. They will be used to check your heart, blood pressure, and oxygen level.  A breathing tube may be placed into your lungs during the procedure.  A thin, flexible tube (catheter) will be placed into your bladder to drain urine.  A tube may be inserted through your nose and into your stomach (nasogastric tube, or NG tube). The tube is used to remove stomach fluids after surgery until the intestines start working again.  An incision will be made in your abdomen.  Clamps or staples will be put on your colon.  The part of the colon between the clamps or staples will be removed.  The ends of the colon that remain will be stitched or stapled together.  The incision in your abdomen will be closed with stitches (sutures) or staples.  The incision will be covered with a bandage (dressing).  A small opening (stoma) may be created in your lower abdomen. A removable, external pouch (ostomy pouch) will be attached to the stoma. This pouch will collect stool outside of your body. Stool passes through the stoma and into the pouch instead of through your anus. The procedure may vary among health care providers and hospitals. What happens after the procedure?  Your blood pressure, heart rate, breathing rate, and blood oxygen level will be monitored until the medicines you were given have worn off.  You may continue to receive fluids and medicines through an IV tube.  You will start on a clear liquid diet and gradually go back to a normal diet.  Do not drive until your health care provider approves.  You may have some pain in your abdomen. You will be  given pain medicine to control the pain.  You will be encouraged to do the following: ? Do breathing exercises to prevent pneumonia. ? Get up and start walking within a day after surgery. You should try to get up 5-6 times a day. This information is not intended to replace advice given to you by your health care provider. Make sure you discuss any questions you have with your health care provider. Document Revised: 11/04/2018 Document Reviewed: 02/10/2016 Elsevier Patient Education  Hailey Randolph.

## 2019-10-13 NOTE — H&P (Signed)
Hailey Randolph is an 69 y.o. female.   Chief Complaint: Neuroendocrine tumor HPI: Patient is a 69 year old white female status post partial small bowel resection in March 2021 at which time a neuroendocrine tumor was found.  Lymph nodes were negative.  Patient was followed by oncology and a PET scan has revealed a single implant in the area of the cecum.  The patient is now presenting for a second look operation.  She has no symptoms from her neuroendocrine tumor.  She currently has 0 out of 10 abdominal pain.  Past Medical History:  Diagnosis Date  . Adenomatous colon polyp   . Anxiety   . HTN (hypertension)   . Hyperlipidemia   . Hypothyroidism     Past Surgical History:  Procedure Laterality Date  . ABDOMINAL HYSTERECTOMY    . APPENDECTOMY    . BIOPSY  08/14/2016   Procedure: BIOPSY;  Surgeon: Daneil Dolin, MD;  Location: [facility];  Service: Endoscopy;;  gastric  . BOWEL RESECTION N/A 07/24/2019   Procedure: PARTIAL SMALL BOWEL RESECTION;  Surgeon: Aviva Signs, MD;  Location: [facility];  Service: General;  Laterality: N/A;  . BREAST EXCISIONAL BIOPSY Left 10/30/2016  . BREAST EXCISIONAL BIOPSY Right 10/30/2016  . COLONOSCOPY  April 2007   Adenomatous polyp, pedunculated, at 30 cm. Scattered left-sided diverticula  . COLONOSCOPY N/A 01/17/2016   Dr. Gala Romney: 5 mm polyp in the cecum, tubular adenoma. Scattered small and large mouth diverticula in the sigmoid colon. Next colonoscopy 5 years.  . ESOPHAGOGASTRODUODENOSCOPY N/A 08/14/2016   Procedure: ESOPHAGOGASTRODUODENOSCOPY (EGD);  Surgeon: Daneil Dolin, MD;  Location: [facility];  Service: Endoscopy;  Laterality: N/A;  215  . hysterectomy     complete  . LAPAROTOMY N/A 07/24/2019   Procedure: EXPLORATORY LAPAROTOMY;  Surgeon: Aviva Signs, MD;  Location: [facility];  Service: General;  Laterality: N/A;  . RADIOACTIVE SEED GUIDED EXCISIONAL BREAST BIOPSY Bilateral 11/02/2016   Procedure: BILATERAL RADIOACTIVE SEED GUIDED EXCISIONAL  BREAST BIOPSY LEFT BREAST 2 SEEDS RIGHT BREAST 1 SEED;  Surgeon: Rolm Bookbinder, MD;  Location: [facility];  Service: General;  Laterality: Bilateral;  . TONSILLECTOMY      Family History  Problem Relation Age of Onset  . Colon polyps Mother   . Breast cancer Mother 34  . Heart attack Father 62       deceased  . Arthritis Sister   . Liver disease Neg Hx    Social History:  reports that she has never smoked. She has never used smokeless tobacco. She reports that she does not drink alcohol or use drugs.  Allergies: No Known Allergies  No medications prior to admission.    No results found for this or any previous visit (from the past 48 hour(s)). No results found.  Review of Systems  Constitutional: Negative.   HENT: Negative.   Eyes: Negative.   Respiratory: Negative.   Cardiovascular: Negative.   Gastrointestinal: Negative.   Endocrine: Negative.   Genitourinary: Negative.   Musculoskeletal: Negative.   Allergic/Immunologic: Negative.   Neurological: Negative.   Hematological: Negative.   Psychiatric/Behavioral: Negative.     There were no vitals taken for this visit. Physical Exam  Vitals reviewed. Constitutional: She is oriented to person, place, and time. She appears well-developed and well-nourished. No distress.  HENT:  Head: Normocephalic and atraumatic.  Cardiovascular: Normal rate and regular rhythm. Exam reveals no gallop and no friction rub.  No murmur heard. Respiratory: Effort normal and breath sounds  normal. No respiratory distress. She has no wheezes. She has no rales.  GI: Soft. Bowel sounds are normal. She exhibits no distension. There is no abdominal tenderness. There is no rebound and no guarding.  Neurological: She is alert and oriented to person, place, and time.  Skin: Skin is warm and dry.     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  Risks and benefits of the procedure including bleeding, infection, anastomotic leak, and the possibility of a blood transfusion were fully explained to the patient, who gave informed consent.  Preoperative bowel prep has been ordered including Suprep, neomycin, and Flagyl.  Aviva Signs, MD 10/13/2019, 8:21 AM

## 2019-10-16 ENCOUNTER — Ambulatory Visit (HOSPITAL_COMMUNITY): Payer: Medicare Other | Admitting: Hematology

## 2019-10-16 ENCOUNTER — Other Ambulatory Visit (HOSPITAL_COMMUNITY): Payer: Medicare Other

## 2019-10-20 NOTE — Patient Instructions (Signed)
Hailey Randolph  10/20/2019     @PREFPERIOPPHARMACY @   Your procedure is scheduled on  10/27/2019 .  Report to [facility] at  [phone]  A.M.  Call this number if you have problems the morning of surgery:  [phone]   Remember:  Follow the diet and prep instructions given to you by Dr Arnoldo Morale                        Take these medicines the morning of surgery with A SIP OF WATER  Levothyroxine, ativan(if needed), tramadol(if needed).    Do not wear jewelry, make-up or nail polish.  Do not wear lotions, powders, or perfumes. Please wear deodorant and brush your teeth.  Do not shave 48 hours prior to surgery.  Men may shave face and neck.  Do not bring valuables to the hospital.  [facility] is not responsible for any belongings or valuables.  Contacts, dentures or bridgework may not be worn into surgery.  Leave your suitcase in the car.  After surgery it may be brought to your room.  For patients admitted to the hospital, discharge time will be determined by your treatment team.  Patients discharged the day of surgery will not be allowed to drive home.   Name and phone number of your driver:  Family Special instructions:  DO NOT smoke the morning of your procedure.  Please read over the following fact sheets that you were given. Pain Booklet, Coughing and Deep Breathing, Blood Transfusion Information, Lab Information, Surgical Site Infection Prevention, Anesthesia Post-op Instructions and Care and Recovery After Surgery       Open Colectomy, Care After This sheet gives you information about how to care for yourself after your procedure. Your health care provider may also give you more specific instructions. If you have problems or questions, contact your health care provider. What can I expect after the procedure? After the procedure, it is common to have:  Pain in your abdomen, especially along your incision.  Tiredness. Your energy level will return to normal  over the next several weeks.  Constipation.  Nausea.  Difficulty urinating. Follow these instructions at home: Activity  You may be able to return to most of your normal activities within 1-2 weeks, such as working, walking up stairs, and sexual activity.  Avoid activities that require a lot of energy for 4-6 weeks after surgery, such as running, climbing, and lifting heavy objects. Ask your health care provider what activities are safe for you.  Take rest breaks during the day as needed.  Do not drive for 1-2 weeks or until your health care provider says that it is safe.  Do not drive or use heavy machinery while taking prescription pain medicines.  Do not lift anything that is heavier than 10 lb (4.3 kg) until your health care provider says that it is safe. Incision care   Follow instructions from your health care provider about how to take care of your incision. Make sure you: ? Wash your hands with soap and water before you change your bandage (dressing). If soap and water are not available, use hand sanitizer. ? Change your dressing as told by your health care provider. ? Leave stitches (sutures) or staples in place. These skin closures may need to stay in place for 2 weeks or longer.  Avoid wearing tight clothing around your incision.  Protect your incision area from the sun.  Check your incision area every day for signs of infection. Check for: ? More redness, swelling, or pain. ? More fluid or blood. ? Warmth. ? Pus or a bad smell. General instructions  Do not take baths, swim, or use a hot tub until your health care provider approves. Ask your health care provider when you may shower.  Take over-the-counter and prescription medicines, including stool softeners, only as told by your health care provider.  Eat a low-fat and low-fiber diet for the first 4 weeks after surgery.  Keep all follow-up visits as told by your health care provider. This is  important. Contact a health care provider if:  You have more redness, swelling, or pain around your incision.  You have more fluid or blood coming from your incision.  Your incision feels warm to the touch.  You have pus or a bad smell coming from your incision.  You have a fever or chills.  You do not have a bowel movement 2-3 days after surgery.  You cannot eat or drink for 24 hours or more.  You have persistent nausea and vomiting.  You have abdominal pain that gets worse and does not get better with medicine. Get help right away if:  You have chest pain.  You have shortness of breath.  You have pain or swelling in your legs.  Your incision breaks open after your sutures or staples have been removed.  You have bleeding from the rectum. This information is not intended to replace advice given to you by your health care provider. Make sure you discuss any questions you have with your health care provider. Document Revised: 04/23/2017 Document Reviewed: 02/10/2016 Elsevier Patient Education  2020 [place] Anesthesia, Adult, Care After This sheet gives you information about how to care for yourself after your procedure. Your health care provider may also give you more specific instructions. If you have problems or questions, contact your health care provider. What can I expect after the procedure? After the procedure, the following side effects are common:  Pain or discomfort at the IV site.  Nausea.  Vomiting.  Sore throat.  Trouble concentrating.  Feeling cold or chills.  Weak or tired.  Sleepiness and fatigue.  Soreness and body aches. These side effects can affect parts of the body that were not involved in surgery. Follow these instructions at home:  For at least 24 hours after the procedure:  Have a responsible adult stay with you. It is important to have someone help care for you until you are awake and alert.  Rest as needed.  Do  not: ? Participate in activities in which you could fall or become injured. ? Drive. ? Use heavy machinery. ? Drink alcohol. ? Take sleeping pills or medicines that cause drowsiness. ? Make important decisions or sign legal documents. ? Take care of children on your own. Eating and drinking  Follow any instructions from your health care provider about eating or drinking restrictions.  When you feel hungry, start by eating small amounts of foods that are soft and easy to digest (bland), such as toast. Gradually return to your regular diet.  Drink enough fluid to keep your urine pale yellow.  If you vomit, rehydrate by drinking water, juice, or clear broth. General instructions  If you have sleep apnea, surgery and certain medicines can increase your risk for breathing problems. Follow instructions from your health care provider about wearing your sleep device: ? Anytime you are sleeping, including during  daytime naps. ? While taking prescription pain medicines, sleeping medicines, or medicines that make you drowsy.  Return to your normal activities as told by your health care provider. Ask your health care provider what activities are safe for you.  Take over-the-counter and prescription medicines only as told by your health care provider.  If you smoke, do not smoke without supervision.  Keep all follow-up visits as told by your health care provider. This is important. Contact a health care provider if:  You have nausea or vomiting that does not get better with medicine.  You cannot eat or drink without vomiting.  You have pain that does not get better with medicine.  You are unable to pass urine.  You develop a skin rash.  You have a fever.  You have redness around your IV site that gets worse. Get help right away if:  You have difficulty breathing.  You have chest pain.  You have blood in your urine or stool, or you vomit blood. Summary  After the procedure, it  is common to have a sore throat or nausea. It is also common to feel tired.  Have a responsible adult stay with you for the first 24 hours after general anesthesia. It is important to have someone help care for you until you are awake and alert.  When you feel hungry, start by eating small amounts of foods that are soft and easy to digest (bland), such as toast. Gradually return to your regular diet.  Drink enough fluid to keep your urine pale yellow.  Return to your normal activities as told by your health care provider. Ask your health care provider what activities are safe for you. This information is not intended to replace advice given to you by your health care provider. Make sure you discuss any questions you have with your health care provider. Document Revised: 05/14/2017 Document Reviewed: 12/25/2016 Elsevier Patient Education  [place]. How to Use Chlorhexidine for Bathing Chlorhexidine gluconate (CHG) is a germ-killing (antiseptic) solution that is used to clean the skin. It can get rid of the bacteria that normally live on the skin and can keep them away for about 24 hours. To clean your skin with CHG, you may be given:  A CHG solution to use in the shower or as part of a sponge bath.  A prepackaged cloth that contains CHG. Cleaning your skin with CHG may help lower the risk for infection:  While you are staying in the intensive care unit of the hospital.  If you have a vascular access, such as a central line, to provide short-term or long-term access to your veins.  If you have a catheter to drain urine from your bladder.  If you are on a ventilator. A ventilator is a machine that helps you breathe by moving air in and out of your lungs.  After surgery. What are the risks? Risks of using CHG include:  A skin reaction.  Hearing loss, if CHG gets in your ears.  Eye injury, if CHG gets in your eyes and is not rinsed out.  The CHG product catching fire. Make  sure that you avoid smoking and flames after applying CHG to your skin. Do not use CHG:  If you have a chlorhexidine allergy or have previously reacted to chlorhexidine.  On babies younger than 56 months of age. How to use CHG solution  Use CHG only as told by your health care provider, and follow the instructions on the  label.  Use the full amount of CHG as directed. Usually, this is one bottle. During a shower Follow these steps when using CHG solution during a shower (unless your health care provider gives you different instructions): 1. Start the shower. 2. Use your normal soap and shampoo to wash your face and hair. 3. Turn off the shower or move out of the shower stream. 4. Pour the CHG onto a clean washcloth. Do not use any type of brush or rough-edged sponge. 5. Starting at your neck, lather your body down to your toes. Make sure you follow these instructions: ? If you will be having surgery, pay special attention to the part of your body where you will be having surgery. Scrub this area for at least 1 minute. ? Do not use CHG on your head or face. If the solution gets into your ears or eyes, rinse them well with water. ? Avoid your genital area. ? Avoid any areas of skin that have broken skin, cuts, or scrapes. ? Scrub your back and under your arms. Make sure to wash skin folds. 6. Let the lather sit on your skin for 1-2 minutes or as long as told by your health care provider. 7. Thoroughly rinse your entire body in the shower. Make sure that all body creases and crevices are rinsed well. 8. Dry off with a clean towel. Do not put any substances on your body afterward--such as powder, lotion, or perfume--unless you are told to do so by your health care provider. Only use lotions that are recommended by the manufacturer. 9. Put on clean clothes or pajamas. 10. If it is the night before your surgery, sleep in clean sheets.  During a sponge bath Follow these steps when using CHG  solution during a sponge bath (unless your health care provider gives you different instructions): 1. Use your normal soap and shampoo to wash your face and hair. 2. Pour the CHG onto a clean washcloth. 3. Starting at your neck, lather your body down to your toes. Make sure you follow these instructions: ? If you will be having surgery, pay special attention to the part of your body where you will be having surgery. Scrub this area for at least 1 minute. ? Do not use CHG on your head or face. If the solution gets into your ears or eyes, rinse them well with water. ? Avoid your genital area. ? Avoid any areas of skin that have broken skin, cuts, or scrapes. ? Scrub your back and under your arms. Make sure to wash skin folds. 4. Let the lather sit on your skin for 1-2 minutes or as long as told by your health care provider. 5. Using a different clean, wet washcloth, thoroughly rinse your entire body. Make sure that all body creases and crevices are rinsed well. 6. Dry off with a clean towel. Do not put any substances on your body afterward--such as powder, lotion, or perfume--unless you are told to do so by your health care provider. Only use lotions that are recommended by the manufacturer. 7. Put on clean clothes or pajamas. 8. If it is the night before your surgery, sleep in clean sheets. How to use CHG prepackaged cloths  Only use CHG cloths as told by your health care provider, and follow the instructions on the label.  Use the CHG cloth on clean, dry skin.  Do not use the CHG cloth on your head or face unless your health care provider tells you to.  When washing with the CHG cloth: ? Avoid your genital area. ? Avoid any areas of skin that have broken skin, cuts, or scrapes. Before surgery Follow these steps when using a CHG cloth to clean before surgery (unless your health care provider gives you different instructions): 1. Using the CHG cloth, vigorously scrub the part of your body  where you will be having surgery. Scrub using a back-and-forth motion for 3 minutes. The area on your body should be completely wet with CHG when you are done scrubbing. 2. Do not rinse. Discard the cloth and let the area air-dry. Do not put any substances on the area afterward, such as powder, lotion, or perfume. 3. Put on clean clothes or pajamas. 4. If it is the night before your surgery, sleep in clean sheets.  For general bathing Follow these steps when using CHG cloths for general bathing (unless your health care provider gives you different instructions). 1. Use a separate CHG cloth for each area of your body. Make sure you wash between any folds of skin and between your fingers and toes. Wash your body in the following order, switching to a new cloth after each step: ? The front of your neck, shoulders, and chest. ? Both of your arms, under your arms, and your hands. ? Your stomach and groin area, avoiding the genitals. ? Your right leg and foot. ? Your left leg and foot. ? The back of your neck, your back, and your buttocks. 2. Do not rinse. Discard the cloth and let the area air-dry. Do not put any substances on your body afterward--such as powder, lotion, or perfume--unless you are told to do so by your health care provider. Only use lotions that are recommended by the manufacturer. 3. Put on clean clothes or pajamas. Contact a health care provider if:  Your skin gets irritated after scrubbing.  You have questions about using your solution or cloth. Get help right away if:  Your eyes become very red or swollen.  Your eyes itch badly.  Your skin itches badly and is red or swollen.  Your hearing changes.  You have trouble seeing.  You have swelling or tingling in your mouth or throat.  You have trouble breathing.  You swallow any chlorhexidine. Summary  Chlorhexidine gluconate (CHG) is a germ-killing (antiseptic) solution that is used to clean the skin. Cleaning your  skin with CHG may help to lower your risk for infection.  You may be given CHG to use for bathing. It may be in a bottle or in a prepackaged cloth to use on your skin. Carefully follow your health care provider's instructions and the instructions on the product label.  Do not use CHG if you have a chlorhexidine allergy.  Contact your health care provider if your skin gets irritated after scrubbing. This information is not intended to replace advice given to you by your health care provider. Make sure you discuss any questions you have with your health care provider. Document Revised: 07/28/2018 Document Reviewed: 04/08/2017 Elsevier Patient Education  [place].

## 2019-10-25 ENCOUNTER — Encounter (HOSPITAL_COMMUNITY): Payer: Self-pay

## 2019-10-25 ENCOUNTER — Telehealth: Payer: Self-pay | Admitting: Family Medicine

## 2019-10-25 ENCOUNTER — Encounter (HOSPITAL_COMMUNITY)
Admission: RE | Admit: 2019-10-25 | Discharge: 2019-10-25 | Disposition: A | Discharge status: Home / Self Care | Payer: Medicare Other | Source: Ambulatory Visit | Attending: General Surgery | Admitting: General Surgery

## 2019-10-25 ENCOUNTER — Other Ambulatory Visit: Payer: Self-pay

## 2019-10-25 ENCOUNTER — Other Ambulatory Visit (HOSPITAL_COMMUNITY)
Admission: RE | Admit: 2019-10-25 | Discharge: 2019-10-25 | Disposition: A | Discharge status: Home / Self Care | Payer: Medicare Other | Source: Ambulatory Visit | Attending: General Surgery | Admitting: General Surgery

## 2019-10-25 DIAGNOSIS — Z01812 Encounter for preprocedural laboratory examination: Secondary | ICD-10-CM | POA: Insufficient documentation

## 2019-10-25 DIAGNOSIS — Z20822 Contact with and (suspected) exposure to covid-19: Secondary | ICD-10-CM | POA: Insufficient documentation

## 2019-10-25 LAB — BASIC METABOLIC PANEL
Anion gap: 13 (ref 5–15)
BUN: 8 mg/dL (ref 8–23)
CO2: 24 mmol/L (ref 22–32)
Calcium: 9.3 mg/dL (ref 8.9–10.3)
Chloride: 99 mmol/L (ref 98–111)
Creatinine, Ser: 0.72 mg/dL (ref 0.44–1.00)
GFR calc Af Amer: >60 mL/min (ref >60)
GFR calc non Af Amer: >60 mL/min (ref >60)
Glucose, Bld: 98 mg/dL (ref 70–99)
Potassium: 2.9 mmol/L — ABNORMAL LOW (ref 3.5–5.1)
Sodium: 136 mmol/L (ref 135–145)

## 2019-10-25 LAB — TYPE AND SCREEN
ABO/RH(D): O POS
Antibody Screen: NEGATIVE

## 2019-10-25 LAB — CBC WITH DIFFERENTIAL/PLATELET
Abs Immature Granulocytes: 0.03 10*3/uL (ref 0.00–0.07)
Basophils Absolute: 0 10*3/uL (ref 0.0–0.1)
Basophils Relative: 0 %
Eosinophils Absolute: 0.1 10*3/uL (ref 0.0–0.5)
Eosinophils Relative: 1 %
HCT: 42.8 % (ref 36.0–46.0)
Hemoglobin: 14 g/dL (ref 12.0–15.0)
Immature Granulocytes: 0 %
Lymphocytes Relative: 30 %
Lymphs Abs: 2.9 10*3/uL (ref 0.7–4.0)
MCH: 30.4 pg (ref 26.0–34.0)
MCHC: 32.7 g/dL (ref 30.0–36.0)
MCV: 93 fL (ref 80.0–100.0)
Monocytes Absolute: 0.7 10*3/uL (ref 0.1–1.0)
Monocytes Relative: 7 %
Neutro Abs: 5.9 10*3/uL (ref 1.7–7.7)
Neutrophils Relative %: 62 %
Platelets: 367 10*3/uL (ref 150–400)
RBC: 4.6 MIL/uL (ref 3.87–5.11)
RDW: 14.6 % (ref 11.5–15.5)
WBC: 9.7 10*3/uL (ref 4.0–10.5)
nRBC: 0 % (ref 0.0–0.2)

## 2019-10-25 LAB — SARS CORONAVIRUS 2 (TAT 6-24 HRS): SARS Coronavirus 2: NEGATIVE

## 2019-10-25 MED ORDER — POTASSIUM CHLORIDE CRYS ER 20 MEQ PO TBCR: 20.0000 meq | EXTENDED_RELEASE_TABLET | Freq: Three times a day (TID) | ORAL | 0 refills | Status: DC

## 2019-10-25 NOTE — Telephone Encounter (Signed)
Received call from Dr. Arnoldo Morale informing of low potassium at 2.9 requesting rx to be sent to her pharmacy - [facility] 20 meq 1 tab po tid x 2 days. #6  Pt called and made aware and med sent to pharm.

## 2019-10-25 NOTE — Progress Notes (Signed)
Message sent to Dr Arnoldo Morale potassium 2.9

## 2019-10-27 ENCOUNTER — Encounter (HOSPITAL_COMMUNITY)
Admission: RE | Disposition: A | Discharge status: Home / Self Care | Payer: Self-pay | Source: Home / Self Care | Attending: General Surgery

## 2019-10-27 ENCOUNTER — Inpatient Hospital Stay (HOSPITAL_COMMUNITY): Payer: Medicare Other | Admitting: Anesthesiology

## 2019-10-27 ENCOUNTER — Other Ambulatory Visit: Payer: Self-pay

## 2019-10-27 ENCOUNTER — Encounter (HOSPITAL_COMMUNITY): Payer: Self-pay | Admitting: General Surgery

## 2019-10-27 ENCOUNTER — Inpatient Hospital Stay (HOSPITAL_COMMUNITY)
Admission: RE | Admit: 2019-10-27 | Discharge: 2019-10-30 | DRG: 331 | Disposition: A | Discharge status: Home / Self Care | Payer: Medicare Other | Attending: General Surgery | Admitting: General Surgery

## 2019-10-27 DIAGNOSIS — Z803 Family history of malignant neoplasm of breast: Secondary | ICD-10-CM | POA: Diagnosis not present

## 2019-10-27 DIAGNOSIS — Z9049 Acquired absence of other specified parts of digestive tract: Secondary | ICD-10-CM

## 2019-10-27 DIAGNOSIS — C7A012 Malignant carcinoid tumor of the ileum: Secondary | ICD-10-CM

## 2019-10-27 DIAGNOSIS — Z86010 Personal history of colonic polyps: Secondary | ICD-10-CM | POA: Diagnosis not present

## 2019-10-27 DIAGNOSIS — E785 Hyperlipidemia, unspecified: Secondary | ICD-10-CM | POA: Diagnosis not present

## 2019-10-27 DIAGNOSIS — Z8371 Family history of colonic polyps: Secondary | ICD-10-CM

## 2019-10-27 DIAGNOSIS — Z8261 Family history of arthritis: Secondary | ICD-10-CM

## 2019-10-27 DIAGNOSIS — D72829 Elevated white blood cell count, unspecified: Secondary | ICD-10-CM | POA: Diagnosis present

## 2019-10-27 DIAGNOSIS — D3A019 Benign carcinoid tumor of the small intestine, unspecified portion: Principal | ICD-10-CM | POA: Diagnosis present

## 2019-10-27 DIAGNOSIS — C7A019 Malignant carcinoid tumor of the small intestine, unspecified portion: Secondary | ICD-10-CM | POA: Diagnosis not present

## 2019-10-27 DIAGNOSIS — Z90710 Acquired absence of both cervix and uterus: Secondary | ICD-10-CM

## 2019-10-27 DIAGNOSIS — I10 Essential (primary) hypertension: Secondary | ICD-10-CM | POA: Diagnosis not present

## 2019-10-27 DIAGNOSIS — D3A8 Other benign neuroendocrine tumors: Secondary | ICD-10-CM | POA: Diagnosis not present

## 2019-10-27 DIAGNOSIS — Z8249 Family history of ischemic heart disease and other diseases of the circulatory system: Secondary | ICD-10-CM

## 2019-10-27 DIAGNOSIS — C7A8 Other malignant neuroendocrine tumors: Secondary | ICD-10-CM | POA: Diagnosis not present

## 2019-10-27 DIAGNOSIS — Z20822 Contact with and (suspected) exposure to covid-19: Secondary | ICD-10-CM | POA: Diagnosis not present

## 2019-10-27 DIAGNOSIS — E039 Hypothyroidism, unspecified: Secondary | ICD-10-CM | POA: Diagnosis present

## 2019-10-27 HISTORY — PX: PARTIAL COLECTOMY: SHX5273

## 2019-10-27 LAB — POCT I-STAT, CHEM 8
BUN: 6 mg/dL — ABNORMAL LOW (ref 8–23)
Calcium, Ion: 1.05 mmol/L — ABNORMAL LOW (ref 1.15–1.40)
Chloride: 103 mmol/L (ref 98–111)
Creatinine, Ser: 0.6 mg/dL (ref 0.44–1.00)
Glucose, Bld: 101 mg/dL — ABNORMAL HIGH (ref 70–99)
HCT: 38 % (ref 36.0–46.0)
Hemoglobin: 12.9 g/dL (ref 12.0–15.0)
Potassium: 3 mmol/L — ABNORMAL LOW (ref 3.5–5.1)
Sodium: 140 mmol/L (ref 135–145)
TCO2: 23 mmol/L (ref 22–32)

## 2019-10-27 SURGERY — COLECTOMY, PARTIAL
Anesthesia: General | Site: Abdomen

## 2019-10-27 MED ORDER — MEPERIDINE HCL 50 MG/ML IJ SOLN: 6.2500 mg | INTRAMUSCULAR | Status: DC | PRN

## 2019-10-27 MED ORDER — ZOLPIDEM TARTRATE 5 MG PO TABS
5.0000 mg | ORAL_TABLET | Freq: Every evening | ORAL | Status: DC | PRN
  Administered 2019-10-27 – 2019-10-29 (×3): 5 mg via ORAL
  Filled 2019-10-27 (×3): qty 1

## 2019-10-27 MED ORDER — SODIUM CHLORIDE 0.9 % IV SOLN
2.0000 g | INTRAVENOUS | Status: AC
  Administered 2019-10-27: 2 g via INTRAVENOUS
  Filled 2019-10-27: qty 2

## 2019-10-27 MED ORDER — LORAZEPAM 2 MG/ML IJ SOLN
0.5000 mg | INTRAMUSCULAR | Status: DC | PRN
  Administered 2019-10-28: 0.5 mg via INTRAVENOUS
  Filled 2019-10-27: qty 1

## 2019-10-27 MED ORDER — DEXMEDETOMIDINE HCL 200 MCG/2ML IV SOLN
INTRAVENOUS | Status: DC | PRN
  Administered 2019-10-27: 20 ug via INTRAVENOUS

## 2019-10-27 MED ORDER — 0.9 % SODIUM CHLORIDE (POUR BTL) OPTIME
TOPICAL | Status: DC | PRN
  Administered 2019-10-27: 1000 mL

## 2019-10-27 MED ORDER — ORAL CARE MOUTH RINSE: 15.0000 mL | Freq: Once | OROMUCOSAL | Status: AC

## 2019-10-27 MED ORDER — ENOXAPARIN SODIUM 40 MG/0.4ML ~~LOC~~ SOLN
40.0000 mg | Freq: Once | SUBCUTANEOUS | Status: AC
  Administered 2019-10-27: 40 mg via SUBCUTANEOUS
  Filled 2019-10-27: qty 0.4

## 2019-10-27 MED ORDER — DIPHENHYDRAMINE HCL 12.5 MG/5ML PO ELIX: 12.5000 mg | ORAL_SOLUTION | Freq: Four times a day (QID) | ORAL | Status: DC | PRN

## 2019-10-27 MED ORDER — PROPOFOL 10 MG/ML IV BOLUS
INTRAVENOUS | Status: DC | PRN
  Administered 2019-10-27: 200 mg via INTRAVENOUS
  Administered 2019-10-27: 25 ug/kg/min via INTRAVENOUS

## 2019-10-27 MED ORDER — BACITRACIN ZINC 500 UNIT/GM EX OINT
TOPICAL_OINTMENT | CUTANEOUS | Status: AC
  Filled 2019-10-27: qty 0.9

## 2019-10-27 MED ORDER — MIDAZOLAM HCL 2 MG/2ML IJ SOLN
INTRAMUSCULAR | Status: AC
  Filled 2019-10-27: qty 2

## 2019-10-27 MED ORDER — KETOROLAC TROMETHAMINE 15 MG/ML IJ SOLN: 15.0000 mg | Freq: Four times a day (QID) | INTRAMUSCULAR | Status: DC | PRN

## 2019-10-27 MED ORDER — SEVOFLURANE IN SOLN
RESPIRATORY_TRACT | Status: AC
  Filled 2019-10-27: qty 250

## 2019-10-27 MED ORDER — ONDANSETRON HCL 4 MG/2ML IJ SOLN
INTRAMUSCULAR | Status: AC
  Filled 2019-10-27: qty 2

## 2019-10-27 MED ORDER — ALVIMOPAN 12 MG PO CAPS
12.0000 mg | ORAL_CAPSULE | ORAL | Status: AC
  Administered 2019-10-27: 12 mg via ORAL
  Filled 2019-10-27: qty 1

## 2019-10-27 MED ORDER — HYDROMORPHONE HCL 1 MG/ML IJ SOLN
0.2500 mg | INTRAMUSCULAR | Status: DC | PRN
  Administered 2019-10-27 (×3): 0.5 mg via INTRAVENOUS
  Filled 2019-10-27 (×3): qty 0.5

## 2019-10-27 MED ORDER — ROCURONIUM BROMIDE 10 MG/ML (PF) SYRINGE
PREFILLED_SYRINGE | INTRAVENOUS | Status: AC
  Filled 2019-10-27: qty 10

## 2019-10-27 MED ORDER — ALVIMOPAN 12 MG PO CAPS
12.0000 mg | ORAL_CAPSULE | Freq: Two times a day (BID) | ORAL | Status: DC
  Administered 2019-10-28: 12 mg via ORAL
  Filled 2019-10-27: qty 1

## 2019-10-27 MED ORDER — TRAMADOL HCL 50 MG PO TABS: 50.0000 mg | ORAL_TABLET | Freq: Four times a day (QID) | ORAL | Status: DC | PRN

## 2019-10-27 MED ORDER — ACETAMINOPHEN 325 MG PO TABS: 650.0000 mg | ORAL_TABLET | Freq: Four times a day (QID) | ORAL | Status: DC | PRN

## 2019-10-27 MED ORDER — HYDROMORPHONE HCL 1 MG/ML IJ SOLN: 1.0000 mg | INTRAMUSCULAR | Status: DC | PRN

## 2019-10-27 MED ORDER — ATORVASTATIN CALCIUM 20 MG PO TABS
20.0000 mg | ORAL_TABLET | Freq: Every day | ORAL | Status: DC
  Administered 2019-10-27 – 2019-10-29 (×3): 20 mg via ORAL
  Filled 2019-10-27 (×3): qty 1

## 2019-10-27 MED ORDER — DEXMEDETOMIDINE HCL IN NACL 200 MCG/50ML IV SOLN
INTRAVENOUS | Status: AC
  Filled 2019-10-27: qty 50

## 2019-10-27 MED ORDER — MIDAZOLAM HCL 2 MG/2ML IJ SOLN
INTRAMUSCULAR | Status: DC | PRN
  Administered 2019-10-27: 2 mg via INTRAVENOUS

## 2019-10-27 MED ORDER — ENOXAPARIN SODIUM 40 MG/0.4ML ~~LOC~~ SOLN
40.0000 mg | SUBCUTANEOUS | Status: DC
  Administered 2019-10-28 – 2019-10-30 (×3): 40 mg via SUBCUTANEOUS
  Filled 2019-10-27 (×3): qty 0.4

## 2019-10-27 MED ORDER — EPHEDRINE SULFATE 50 MG/ML IJ SOLN
INTRAMUSCULAR | Status: DC | PRN
  Administered 2019-10-27: 5 mg via INTRAVENOUS
  Administered 2019-10-27: 10 mg via INTRAVENOUS

## 2019-10-27 MED ORDER — CHLORHEXIDINE GLUCONATE 0.12 % MT SOLN
15.0000 mL | Freq: Once | OROMUCOSAL | Status: AC
  Administered 2019-10-27: 15 mL via OROMUCOSAL
  Filled 2019-10-27: qty 15

## 2019-10-27 MED ORDER — METOCLOPRAMIDE HCL 5 MG/ML IJ SOLN
INTRAMUSCULAR | Status: DC | PRN
  Administered 2019-10-27: 10 mg via INTRAVENOUS

## 2019-10-27 MED ORDER — ONDANSETRON HCL 4 MG/2ML IJ SOLN
INTRAMUSCULAR | Status: DC | PRN
  Administered 2019-10-27: 4 mg via INTRAVENOUS

## 2019-10-27 MED ORDER — PROPOFOL 10 MG/ML IV BOLUS
INTRAVENOUS | Status: AC
  Filled 2019-10-27: qty 60

## 2019-10-27 MED ORDER — LOSARTAN POTASSIUM 50 MG PO TABS
100.0000 mg | ORAL_TABLET | Freq: Every day | ORAL | Status: DC
  Administered 2019-10-27 – 2019-10-30 (×3): 100 mg via ORAL
  Filled 2019-10-27 (×4): qty 2

## 2019-10-27 MED ORDER — ONDANSETRON 4 MG PO TBDP: 4.0000 mg | ORAL_TABLET | Freq: Four times a day (QID) | ORAL | Status: DC | PRN

## 2019-10-27 MED ORDER — LEVOTHYROXINE SODIUM 75 MCG PO TABS
75.0000 ug | ORAL_TABLET | Freq: Every day | ORAL | Status: DC
  Administered 2019-10-28 – 2019-10-30 (×3): 75 ug via ORAL
  Filled 2019-10-27 (×3): qty 1

## 2019-10-27 MED ORDER — CHLORHEXIDINE GLUCONATE CLOTH 2 % EX PADS: 6.0000 | MEDICATED_PAD | Freq: Once | CUTANEOUS | Status: DC

## 2019-10-27 MED ORDER — EPHEDRINE 5 MG/ML INJ
INTRAVENOUS | Status: AC
  Filled 2019-10-27: qty 10

## 2019-10-27 MED ORDER — SUGAMMADEX SODIUM 500 MG/5ML IV SOLN
INTRAVENOUS | Status: DC | PRN
  Administered 2019-10-27: 200 mg via INTRAVENOUS

## 2019-10-27 MED ORDER — HYDROCODONE-ACETAMINOPHEN 5-325 MG PO TABS
1.0000 | ORAL_TABLET | ORAL | Status: DC | PRN
  Administered 2019-10-27: 2 via ORAL
  Administered 2019-10-27: 1 via ORAL
  Administered 2019-10-28 (×2): 2 via ORAL
  Administered 2019-10-28: 1 via ORAL
  Administered 2019-10-28 – 2019-10-30 (×8): 2 via ORAL
  Filled 2019-10-27: qty 1
  Filled 2019-10-27: qty 2
  Filled 2019-10-27: qty 1
  Filled 2019-10-27 (×10): qty 2

## 2019-10-27 MED ORDER — BUPIVACAINE LIPOSOME 1.3 % IJ SUSP
INTRAMUSCULAR | Status: AC
  Filled 2019-10-27: qty 20

## 2019-10-27 MED ORDER — LACTATED RINGERS IV SOLN: INTRAVENOUS | Status: DC

## 2019-10-27 MED ORDER — KETOROLAC TROMETHAMINE 15 MG/ML IJ SOLN
15.0000 mg | Freq: Four times a day (QID) | INTRAMUSCULAR | Status: AC
  Administered 2019-10-27: 15 mg via INTRAVENOUS
  Filled 2019-10-27: qty 1

## 2019-10-27 MED ORDER — KETAMINE HCL 10 MG/ML IJ SOLN
INTRAMUSCULAR | Status: DC | PRN
  Administered 2019-10-27: 50 mg via INTRAVENOUS

## 2019-10-27 MED ORDER — ONDANSETRON HCL 4 MG/2ML IJ SOLN: 4.0000 mg | Freq: Once | INTRAMUSCULAR | Status: DC | PRN

## 2019-10-27 MED ORDER — ONDANSETRON HCL 4 MG/2ML IJ SOLN: 4.0000 mg | Freq: Four times a day (QID) | INTRAMUSCULAR | Status: DC | PRN

## 2019-10-27 MED ORDER — LIDOCAINE 2% (20 MG/ML) 5 ML SYRINGE
INTRAMUSCULAR | Status: AC
  Filled 2019-10-27: qty 5

## 2019-10-27 MED ORDER — DEXAMETHASONE SODIUM PHOSPHATE 10 MG/ML IJ SOLN
INTRAMUSCULAR | Status: DC | PRN
  Administered 2019-10-27: 10 mg via INTRAVENOUS

## 2019-10-27 MED ORDER — KETAMINE HCL 50 MG/5ML IJ SOSY
PREFILLED_SYRINGE | INTRAMUSCULAR | Status: AC
  Filled 2019-10-27: qty 5

## 2019-10-27 MED ORDER — DIPHENHYDRAMINE HCL 50 MG/ML IJ SOLN: 12.5000 mg | Freq: Four times a day (QID) | INTRAMUSCULAR | Status: DC | PRN

## 2019-10-27 MED ORDER — SIMETHICONE 80 MG PO CHEW: 40.0000 mg | CHEWABLE_TABLET | Freq: Four times a day (QID) | ORAL | Status: DC | PRN

## 2019-10-27 MED ORDER — LACTATED RINGERS IV SOLN: Freq: Once | INTRAVENOUS | Status: AC

## 2019-10-27 MED ORDER — POVIDONE-IODINE 10 % EX OINT
TOPICAL_OINTMENT | CUTANEOUS | Status: AC
  Filled 2019-10-27: qty 1

## 2019-10-27 MED ORDER — ACETAMINOPHEN 650 MG RE SUPP: 650.0000 mg | Freq: Four times a day (QID) | RECTAL | Status: DC | PRN

## 2019-10-27 MED ORDER — ROCURONIUM BROMIDE 100 MG/10ML IV SOLN
INTRAVENOUS | Status: DC | PRN
  Administered 2019-10-27: 60 mg via INTRAVENOUS

## 2019-10-27 MED ORDER — LIDOCAINE HCL (CARDIAC) PF 50 MG/5ML IV SOSY
PREFILLED_SYRINGE | INTRAVENOUS | Status: DC | PRN
  Administered 2019-10-27: 100 mg via INTRAVENOUS

## 2019-10-27 MED ORDER — DEXAMETHASONE SODIUM PHOSPHATE 10 MG/ML IJ SOLN
INTRAMUSCULAR | Status: AC
  Filled 2019-10-27: qty 1

## 2019-10-27 MED ORDER — HYDROCHLOROTHIAZIDE 25 MG PO TABS
25.0000 mg | ORAL_TABLET | Freq: Every day | ORAL | Status: DC
  Administered 2019-10-28 – 2019-10-30 (×3): 25 mg via ORAL
  Filled 2019-10-27 (×4): qty 1

## 2019-10-27 MED ORDER — BUPIVACAINE LIPOSOME 1.3 % IJ SUSP
INTRAMUSCULAR | Status: DC | PRN
  Administered 2019-10-27: 20 mL

## 2019-10-27 MED ORDER — METOCLOPRAMIDE HCL 5 MG/ML IJ SOLN
INTRAMUSCULAR | Status: AC
  Filled 2019-10-27: qty 2

## 2019-10-27 SURGICAL SUPPLY — 57 items
BARRIER SKIN 2 3/4 (OSTOMY) IMPLANT
BARRIER SKIN 2 3/4 INCH (OSTOMY)
BARRIER SKIN OD2.25 2 3/4 FLNG (OSTOMY) IMPLANT
BRR SKN FLT 2.75X2.25 2 PC (OSTOMY)
CELLS DAT CNTRL 66122 CELL SVR (MISCELLANEOUS) IMPLANT
CLAMP POUCH DRAINAGE QUIET (OSTOMY) IMPLANT
COVER LIGHT HANDLE STERIS (MISCELLANEOUS) ×12 IMPLANT
COVER WAND RF STERILE (DRAPES) ×3 IMPLANT
DRSG OPSITE POSTOP 4X10 (GAUZE/BANDAGES/DRESSINGS) IMPLANT
DRSG OPSITE POSTOP 4X8 (GAUZE/BANDAGES/DRESSINGS) ×4 IMPLANT
ELECT BLADE 6 FLAT ULTRCLN (ELECTRODE) ×2 IMPLANT
ELECT REM PT RETURN 9FT ADLT (ELECTROSURGICAL) ×3
ELECTRODE REM PT RTRN 9FT ADLT (ELECTROSURGICAL) ×1 IMPLANT
GLOVE BIO SURGEON STRL SZ 6.5 (GLOVE) ×4 IMPLANT
GLOVE BIO SURGEONS STRL SZ 6.5 (GLOVE) ×2
GLOVE BIOGEL PI IND STRL 6.5 (GLOVE) ×2 IMPLANT
GLOVE BIOGEL PI IND STRL 7.0 (GLOVE) ×5 IMPLANT
GLOVE BIOGEL PI INDICATOR 6.5 (GLOVE) ×4
GLOVE BIOGEL PI INDICATOR 7.0 (GLOVE) ×4
GLOVE SURG SS PI 7.5 STRL IVOR (GLOVE) ×6 IMPLANT
GOWN STRL REUS W/TWL LRG LVL3 (GOWN DISPOSABLE) ×18 IMPLANT
INST SET MAJOR GENERAL (KITS) ×3 IMPLANT
KIT TURNOVER KIT A (KITS) ×3 IMPLANT
LIGASURE IMPACT 36 18CM CVD LR (INSTRUMENTS) ×3 IMPLANT
MANIFOLD NEPTUNE II (INSTRUMENTS) ×3 IMPLANT
NDL HYPO 18GX1.5 BLUNT FILL (NEEDLE) ×1 IMPLANT
NEEDLE HYPO 18GX1.5 BLUNT FILL (NEEDLE) ×3 IMPLANT
NS IRRIG 1000ML POUR BTL (IV SOLUTION) ×6 IMPLANT
PAD ARMBOARD 7.5X6 YLW CONV (MISCELLANEOUS) ×3 IMPLANT
PENCIL SMOKE EVACUATOR COATED (MISCELLANEOUS) ×3 IMPLANT
POUCH OSTOMY 2 3/4  H 3804 (WOUND CARE)
POUCH OSTOMY 2 3/4 H 3804 (WOUND CARE)
POUCH OSTOMY 2 PC DRNBL 2.25 (WOUND CARE) IMPLANT
POUCH OSTOMY 2 PC DRNBL 2.75 (WOUND CARE) IMPLANT
POUCH OSTOMY DRNBL 2 1/4 (WOUND CARE)
RELOAD LINEAR CUT PROX 55 BLUE (ENDOMECHANICALS) IMPLANT
RELOAD PROXIMATE 75MM BLUE (ENDOMECHANICALS) ×6 IMPLANT
RELOAD STAPLE 55 3.8 BLU REG (ENDOMECHANICALS) IMPLANT
RELOAD STAPLE 75 3.8 BLU REG (ENDOMECHANICALS) IMPLANT
RETRACTOR WND ALEXIS 18 MED (MISCELLANEOUS) IMPLANT
RETRACTOR WND ALEXIS 25 LRG (MISCELLANEOUS) IMPLANT
RTRCTR WOUND ALEXIS 18CM MED (MISCELLANEOUS)
RTRCTR WOUND ALEXIS 25CM LRG (MISCELLANEOUS) ×3
SPONGE LAP 18X18 RF (DISPOSABLE) ×4 IMPLANT
STAPLER GUN LINEAR PROX 60 (STAPLE) ×3 IMPLANT
STAPLER PROXIMATE 55 BLUE (STAPLE) IMPLANT
STAPLER PROXIMATE 75MM BLUE (STAPLE) ×2 IMPLANT
STAPLER VISISTAT (STAPLE) ×3 IMPLANT
SUT CHROMIC 0 SH (SUTURE) IMPLANT
SUT CHROMIC 2 0 SH (SUTURE) ×2 IMPLANT
SUT PDS AB 0 CTX 60 (SUTURE) ×4 IMPLANT
SUT SILK 2 0 (SUTURE)
SUT SILK 2-0 18XBRD TIE 12 (SUTURE) IMPLANT
SUT SILK 3 0 SH CR/8 (SUTURE) ×5 IMPLANT
TRAY COLON PACK (CUSTOM PROCEDURE TRAY) ×3 IMPLANT
TRAY FOLEY MTR SLVR 16FR STAT (SET/KITS/TRAYS/PACK) ×3 IMPLANT
YANKAUER SUCT BULB TIP 10FT TU (MISCELLANEOUS) ×3 IMPLANT

## 2019-10-27 NOTE — Anesthesia Postprocedure Evaluation (Signed)
Anesthesia Post Note  Patient: Hailey Randolph  Procedure(s) Performed: RIGHT HEMI-COLECTOMY (N/A Abdomen)  Patient location during evaluation: PACU Anesthesia Type: General Level of consciousness: awake, oriented, awake and alert and patient cooperative Pain management: pain level controlled Vital Signs Assessment: post-procedure vital signs reviewed and stable Respiratory status: spontaneous breathing, nonlabored ventilation and respiratory function stable Cardiovascular status: blood pressure returned to baseline and stable Postop Assessment: no headache and no backache Anesthetic complications: no     Last Vitals:  Vitals:   10/27/19 0903 10/27/19 0915  BP:  (!) 93/51  Pulse:  66  Resp:  19  Temp: (!) 36.3 C   SpO2:  91%    Last Pain:  Vitals:   10/27/19 0929  TempSrc:   PainSc: 8                  Tacy Learn

## 2019-10-27 NOTE — Transfer of Care (Signed)
Immediate Anesthesia Transfer of Care Note  Patient: Hailey Randolph  Procedure(s) Performed: RIGHT COLECTOMY (N/A Abdomen)  Patient Location: PACU  Anesthesia Type:General  Level of Consciousness: drowsy, patient cooperative and responds to stimulation  Airway & Oxygen Therapy: Patient Spontanous Breathing and Patient connected to nasal cannula oxygen  Post-op Assessment: Report given to RN and Post -op Vital signs reviewed and stable  Post vital signs: Reviewed and stable  Last Vitals:  Vitals Value Taken Time  BP 86/43 10/27/19 0903  Temp    Pulse 66 10/27/19 0906  Resp 19 10/27/19 0906  SpO2 95 % 10/27/19 0906  Vitals shown include unvalidated device data.  Last Pain:  Vitals:   10/27/19 0711  TempSrc: Oral  PainSc: 0-No pain      Patients Stated Pain Goal: 6 (10/10/31 5825)  Complications: No apparent anesthesia complications

## 2019-10-27 NOTE — Op Note (Signed)
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  The patient now presents back for exploratory laparotomy and possible right hemicolectomy.  The risks and benefits of the procedure including bleeding, infection, anastomotic leak, and the possibility of a blood transfusion were fully explained to the patient, who gave informed consent.  Procedure note: The patient was placed in the supine position.  After induction of general endotracheal anesthesia, the abdomen was prepped and draped using the usual sterile technique with ChloraPrep.  Surgical site confirmation was performed.  The midline incision was made from the umbilicus inferiorly.  The peritoneal cavity was entered into without difficulty.  The liver was palpated and no abnormal lesions were noted.  The small bowel was then inspected from the ligament of Treitz to the terminal ileum.  The previously performed anastomosis in the distal ileum was noted to be intact with a hard nodule present in the mesentery.  This was adhesed to the mesentery of the right colon.  In addition, the single nodule that was seen on PET scan was noted on the medial aspect of the surface of the cecum.  It was elected to proceed with an en bloc resection of the previous anastomosis along with the right colon.  The right colon was mobilized along the peritoneal reflection.  The duodenum was visualized and kept from the resection field.  The hepatic flexure was taken down using the LigaSure.  A GIA stapler was placed across the distal small bowel and fired.  This was  likewise done across the proximal transverse colon, proximal to the middle colic artery.  The mesentery was then divided using the LigaSure.  The specimen was then sent to pathology further examination.  A side-to-side ileocolic anastomosis was performed using a GIA-75 stapler.  The enterotomy was closed using a TA 60 stapler.  The staple line was bolstered using 3-0 silk sutures.  Surrounding omentum was placed over the anastomosis and secured to it using 3-0 silk sutures.  The mesenteric defect was closed using a 2-0 Chromic Gut running suture.  The bowel was then returned into the abdominal cavity in an orderly fashion.  The abdomen was then copiously irrigated normal saline.  All operating personnel then changed her gown and gloves.  A new set up was used for closure.  The fascia was reapproximated using a looped 0 PDS running suture.  The subcutaneous layer was irrigated with normal saline.  Exparel was instilled into the surrounding wound.  The skin was closed using staples.  Betadine ointment and a dry sterile dressing were applied.  All tape and needle counts were correct at the end of the procedure.  The patient was extubated in the operating room and transferred to PACU in stable condition.  Complications: None  EBL: 25 cc  Specimen: Right colon and terminal ileum

## 2019-10-27 NOTE — Interval H&P Note (Signed)
History and Physical Interval Note:  10/27/2019 7:16 AM  [name]  has presented today for surgery, with the diagnosis of Carcinoid tumor.  The various methods of treatment have been discussed with the patient and family. After consideration of risks, benefits and other options for treatment, the patient has consented to  Procedure(s): PARTIAL COLECTOMY (N/A) as a surgical intervention.  The patient's history has been reviewed, patient examined, no change in status, stable for surgery.  I have reviewed the patient's chart and labs.  Questions were answered to the patient's satisfaction.     Aviva Signs

## 2019-10-27 NOTE — Anesthesia Procedure Notes (Signed)
Procedure Name: Intubation Performed by: Tacy Learn, CRNA Pre-anesthesia Checklist: Patient identified, Emergency Drugs available, Suction available, Patient being monitored and Timeout performed Patient Re-evaluated:Patient Re-evaluated prior to induction Oxygen Delivery Method: Circle system utilized Preoxygenation: Pre-oxygenation with 100% oxygen Induction Type: IV induction Ventilation: Oral airway inserted - appropriate to patient size and Mask ventilation without difficulty Grade View: Grade II Tube type: Oral Tube size: 7.0 mm Number of attempts: 1 Airway Equipment and Method: Stylet Placement Confirmation: ETT inserted through vocal cords under direct vision,  positive ETCO2,  CO2 detector and breath sounds checked- equal and bilateral Secured at: 21 cm Tube secured with: Tape Dental Injury: Teeth and Oropharynx as per pre-operative assessment

## 2019-10-27 NOTE — Anesthesia Preprocedure Evaluation (Addendum)
Anesthesia Evaluation  Patient identified by MRN, date of birth, ID band Patient awake    Reviewed: Allergy & Precautions, NPO status , Patient's Chart, lab work & pertinent test results  History of Anesthesia Complications Negative for: history of anesthetic complications  Airway Mallampati: II  TM Distance: >3 FB Neck ROM: Full    Dental  (+) Teeth Intact, Dental Advisory Given   Pulmonary neg pulmonary ROS,    Pulmonary exam normal breath sounds clear to auscultation       Cardiovascular Exercise Tolerance: Good hypertension, Pt. on medications Normal cardiovascular exam Rhythm:Regular Rate:Normal  24-Jul-2019 11:05:12 [facility] ROUTINE RECORD Normal sinus rhythm Cannot rule out Inferior infarct , age undetermined Nonspecific T wave abnormality Abnormal ECG Confirmed by Charolette Forward ([id number]) on 07/24/2019 9:21:47 PM   Neuro/Psych Anxiety negative neurological ROS     GI/Hepatic Neg liver ROS, Bowel prep,  Endo/Other  Hypothyroidism   Renal/GU negative Renal ROS  negative genitourinary   Musculoskeletal negative musculoskeletal ROS (+)   Abdominal   Peds  Hematology negative hematology ROS (+)   Anesthesia Other Findings Hypokalemia - 2.9  Reproductive/Obstetrics negative OB ROS                                                            Anesthesia Evaluation  Patient identified by MRN, date of birth, ID band Patient awake    Reviewed: Allergy & Precautions, NPO status , Patient's Chart, lab work & pertinent test results  History of Anesthesia Complications Negative for: history of anesthetic complications  Airway Mallampati: II  TM Distance: >3 FB Neck ROM: Full    Dental  (+) Teeth Intact   Pulmonary neg pulmonary ROS,    Pulmonary exam normal breath sounds clear to auscultation       Cardiovascular Exercise Tolerance:  Good hypertension, Pt. on medications  Rhythm:Regular Rate:Normal - Systolic murmurs, - Diastolic murmurs, - Friction Rub, - Carotid Bruit, - Peripheral Edema and - Systolic Click    Neuro/Psych Anxiety negative neurological ROS     GI/Hepatic Neg liver ROS, Small bowel obstruction   Endo/Other  Hypothyroidism   Renal/GU negative Renal ROS  negative genitourinary   Musculoskeletal negative musculoskeletal ROS (+)   Abdominal   Peds  Hematology negative hematology ROS (+)   Anesthesia Other Findings   Reproductive/Obstetrics negative OB ROS                            Anesthesia Physical Anesthesia Plan  ASA: III  Anesthesia Plan: General   Post-op Pain Management:    Induction: Intravenous, Rapid sequence and Cricoid pressure planned  PONV Risk Score and Plan: 4 or greater and Ondansetron, Midazolam and Dexamethasone  Airway Management Planned: Oral ETT  Additional Equipment:   Intra-op Plan:   Post-operative Plan: Extubation in OR and Possible Post-op intubation/ventilation  Informed Consent: I have reviewed the patients History and Physical, chart, labs and discussed the procedure including the risks, benefits and alternatives for the proposed anesthesia with the patient or authorized representative who has indicated his/her understanding and acceptance.     Dental advisory given  Plan Discussed with: CRNA  Anesthesia Plan Comments:        Anesthesia Quick Evaluation  Anesthesia Physical Anesthesia  Plan  ASA: II  Anesthesia Plan: General   Post-op Pain Management:    Induction: Intravenous  PONV Risk Score and Plan: 4 or greater and Ondansetron, Dexamethasone and Midazolam  Airway Management Planned: Oral ETT  Additional Equipment:   Intra-op Plan:   Post-operative Plan: Extubation in OR  Informed Consent: I have reviewed the patients History and Physical, chart, labs and discussed the procedure  including the risks, benefits and alternatives for the proposed anesthesia with the patient or authorized representative who has indicated his/her understanding and acceptance.     Dental advisory given  Plan Discussed with: CRNA and Surgeon  Anesthesia Plan Comments:         Anesthesia Quick Evaluation

## 2019-10-28 LAB — PHOSPHORUS: Phosphorus: 2.7 mg/dL (ref 2.5–4.6)

## 2019-10-28 LAB — BASIC METABOLIC PANEL
Anion gap: 9 (ref 5–15)
BUN: 7 mg/dL — ABNORMAL LOW (ref 8–23)
CO2: 27 mmol/L (ref 22–32)
Calcium: 8.3 mg/dL — ABNORMAL LOW (ref 8.9–10.3)
Chloride: 103 mmol/L (ref 98–111)
Creatinine, Ser: 0.64 mg/dL (ref 0.44–1.00)
GFR calc Af Amer: >60 mL/min (ref >60)
GFR calc non Af Amer: >60 mL/min (ref >60)
Glucose, Bld: 119 mg/dL — ABNORMAL HIGH (ref 70–99)
Potassium: 3.6 mmol/L (ref 3.5–5.1)
Sodium: 139 mmol/L (ref 135–145)

## 2019-10-28 LAB — CBC
HCT: 33.8 % — ABNORMAL LOW (ref 36.0–46.0)
Hemoglobin: 11.1 g/dL — ABNORMAL LOW (ref 12.0–15.0)
MCH: 30.7 pg (ref 26.0–34.0)
MCHC: 32.8 g/dL (ref 30.0–36.0)
MCV: 93.6 fL (ref 80.0–100.0)
Platelets: 252 10*3/uL (ref 150–400)
RBC: 3.61 MIL/uL — ABNORMAL LOW (ref 3.87–5.11)
RDW: 15 % (ref 11.5–15.5)
WBC: 13.2 10*3/uL — ABNORMAL HIGH (ref 4.0–10.5)
nRBC: 0 % (ref 0.0–0.2)

## 2019-10-28 LAB — MAGNESIUM: Magnesium: 1.4 mg/dL — ABNORMAL LOW (ref 1.7–2.4)

## 2019-10-28 MED ORDER — CHLORHEXIDINE GLUCONATE CLOTH 2 % EX PADS
6.0000 | MEDICATED_PAD | Freq: Every day | CUTANEOUS | Status: DC
  Administered 2019-10-28: 6 via TOPICAL

## 2019-10-28 MED ORDER — MAGNESIUM SULFATE 4 GM/100ML IV SOLN
4.0000 g | Freq: Once | INTRAVENOUS | Status: AC
  Administered 2019-10-28: 4 g via INTRAVENOUS
  Filled 2019-10-28: qty 100

## 2019-10-28 MED ORDER — POTASSIUM CHLORIDE CRYS ER 20 MEQ PO TBCR
40.0000 meq | EXTENDED_RELEASE_TABLET | Freq: Once | ORAL | Status: AC
  Administered 2019-10-28: 40 meq via ORAL
  Filled 2019-10-28: qty 2

## 2019-10-28 MED ORDER — LORAZEPAM 0.5 MG PO TABS
0.5000 mg | ORAL_TABLET | ORAL | Status: DC | PRN
  Administered 2019-10-28 – 2019-10-30 (×2): 0.5 mg via ORAL
  Filled 2019-10-28 (×2): qty 1

## 2019-10-28 NOTE — Progress Notes (Signed)
[facility] Progress Note  1 Day Post-Op  Subjective: Doing well. Having Bm and tolerating diet. Ambulated.   Objective: Vital signs in last 24 hours: Temp:  [98.1 F (36.7 C)-98.8 F (37.1 C)] 98.8 F (37.1 C) (06/05 0455) Pulse Rate:  [70-79] 70 (06/05 1503) Resp:  [15-17] 15 (06/05 1503) BP: (98-167)/(46-76) 167/76 (06/05 1503) SpO2:  [91 %-95 %] 94 % (06/05 1503) Last BM Date: 10/28/19  Intake/Output from previous day: 06/04 0701 - 06/05 0700 In: 6320 [P.O.:720; I.V.:5500; IV Piggyback:100] Out: 1150 [Urine:1125; Blood:25] Intake/Output this shift: Total I/O In: 500 [P.O.:500] Out: 1800 [Urine:1800]  General appearance: alert, cooperative and no distress Resp: normal work of breathing GI: soft, nondistended, appropriately tender, staples c/d/i with honeycomb dressing in place  Lab Results:  Recent Labs    10/27/19 0703 10/28/19 0630  WBC  --  13.2*  HGB 12.9 11.1*  HCT 38.0 33.8*  PLT  --  252   BMET Recent Labs    10/27/19 0703 10/28/19 0630  NA 140 139  K 3.0* 3.6  CL 103 103  CO2  --  27  GLUCOSE 101* 119*  BUN 6* 7*  CREATININE 0.60 0.64  CALCIUM  --  8.3*    Anti-infectives: Anti-infectives (From admission, onward)   Start     Dose/Rate Route Frequency Ordered Stop   10/27/19 0630  cefoTEtan (CEFOTAN) 2 g in sodium chloride 0.9 % 100 mL IVPB     2 g 200 mL/hr over 30 Minutes Intravenous On call to O.R. 10/27/19 0625 10/27/19 0804      Assessment/Plan: Ms. Feig is a 69 yo s/p R hemicolectomy for neuroendocrine tumor recurrence. Doing well.  Multimodality for pain IS, OOB HD ok Soft diet now, d/c entereg  UOP good, foley out, K replaced, Mg replaced, LR down to 50 Mild leukocytosis, H&H down slightly, all expected post op  Labs tomorrow  SCDs, lovenox   LOS: 1 day    Virl Cagey 10/28/2019

## 2019-10-29 LAB — BASIC METABOLIC PANEL
Anion gap: 8 (ref 5–15)
BUN: 8 mg/dL (ref 8–23)
CO2: 26 mmol/L (ref 22–32)
Calcium: 7.8 mg/dL — ABNORMAL LOW (ref 8.9–10.3)
Chloride: 106 mmol/L (ref 98–111)
Creatinine, Ser: 0.66 mg/dL (ref 0.44–1.00)
GFR calc Af Amer: >60 mL/min (ref >60)
GFR calc non Af Amer: >60 mL/min (ref >60)
Glucose, Bld: 91 mg/dL (ref 70–99)
Potassium: 3.8 mmol/L (ref 3.5–5.1)
Sodium: 140 mmol/L (ref 135–145)

## 2019-10-29 LAB — CBC WITH DIFFERENTIAL/PLATELET
Abs Immature Granulocytes: 0.05 10*3/uL (ref 0.00–0.07)
Basophils Absolute: 0 10*3/uL (ref 0.0–0.1)
Basophils Relative: 0 %
Eosinophils Absolute: 0 10*3/uL (ref 0.0–0.5)
Eosinophils Relative: 0 %
HCT: 33.1 % — ABNORMAL LOW (ref 36.0–46.0)
Hemoglobin: 10.4 g/dL — ABNORMAL LOW (ref 12.0–15.0)
Immature Granulocytes: 1 %
Lymphocytes Relative: 23 %
Lymphs Abs: 2.4 10*3/uL (ref 0.7–4.0)
MCH: 30.5 pg (ref 26.0–34.0)
MCHC: 31.4 g/dL (ref 30.0–36.0)
MCV: 97.1 fL (ref 80.0–100.0)
Monocytes Absolute: 0.6 10*3/uL (ref 0.1–1.0)
Monocytes Relative: 6 %
Neutro Abs: 7.4 10*3/uL (ref 1.7–7.7)
Neutrophils Relative %: 70 %
Platelets: 234 10*3/uL (ref 150–400)
RBC: 3.41 MIL/uL — ABNORMAL LOW (ref 3.87–5.11)
RDW: 15.8 % — ABNORMAL HIGH (ref 11.5–15.5)
WBC: 10.5 10*3/uL (ref 4.0–10.5)
nRBC: 0 % (ref 0.0–0.2)

## 2019-10-29 LAB — MAGNESIUM: Magnesium: 2.1 mg/dL (ref 1.7–2.4)

## 2019-10-29 NOTE — Progress Notes (Signed)
[facility] Progress Note  2 Days Post-Op  Subjective: Slept better. Feeling better. Having Bms. Tolerating diet. Pain controlled with orals.   Objective: Vital signs in last 24 hours: Temp:  [98 F (36.7 C)-98.1 F (36.7 C)] 98 F (36.7 C) (06/06 0514) Pulse Rate:  [65-72] 65 (06/06 0514) Resp:  [15-18] 18 (06/06 0514) BP: (136-167)/(76-82) 144/82 (06/06 0514) SpO2:  [94 %-97 %] 97 % (06/06 0514) Last BM Date: 10/29/19  Intake/Output from previous day: 06/05 0701 - 06/06 0700 In: 2402 [P.O.:1220; I.V.:1082; IV Piggyback:100] Out: 1800 [Urine:1800] Intake/Output this shift: Total I/O In: 925.2 [I.V.:925.2] Out: -   General appearance: alert, cooperative and no distress Resp: normal work of breathing GI: soft, nondistended, appropriately tender, incision with staples, honeycomb clean with no erytheam or drainage  Lab Results:  Recent Labs    10/28/19 0630 10/29/19 0458  WBC 13.2* 10.5  HGB 11.1* 10.4*  HCT 33.8* 33.1*  PLT 252 234   BMET Recent Labs    10/28/19 0630 10/29/19 0458  NA 139 140  K 3.6 3.8  CL 103 106  CO2 27 26  GLUCOSE 119* 91  BUN 7* 8  CREATININE 0.64 0.66  CALCIUM 8.3* 7.8*     Assessment/Plan: Ms. Houlton is a 69 yo POD 2 s/p R hemicolectomy for neuroendocrine tumor recurrence. Doing well.  Multimodality for pain IS, OOB HD ok Soft diet  UOP good, K normal, Mg normal. Stop IVF  Resolved post op leukocytosis, H&H down slightly,monitor CBC and BMP tomorrow  tomorrow  SCDs, lovenox Potentially home in next 24-48 hr   LOS: 2 days    Virl Cagey 10/29/2019

## 2019-10-30 LAB — CBC WITH DIFFERENTIAL/PLATELET
Abs Immature Granulocytes: 0.02 10*3/uL (ref 0.00–0.07)
Basophils Absolute: 0 10*3/uL (ref 0.0–0.1)
Basophils Relative: 0 %
Eosinophils Absolute: 0.2 10*3/uL (ref 0.0–0.5)
Eosinophils Relative: 2 %
HCT: 34 % — ABNORMAL LOW (ref 36.0–46.0)
Hemoglobin: 10.6 g/dL — ABNORMAL LOW (ref 12.0–15.0)
Immature Granulocytes: 0 %
Lymphocytes Relative: 38 %
Lymphs Abs: 3 10*3/uL (ref 0.7–4.0)
MCH: 30.4 pg (ref 26.0–34.0)
MCHC: 31.2 g/dL (ref 30.0–36.0)
MCV: 97.4 fL (ref 80.0–100.0)
Monocytes Absolute: 0.5 10*3/uL (ref 0.1–1.0)
Monocytes Relative: 6 %
Neutro Abs: 4.2 10*3/uL (ref 1.7–7.7)
Neutrophils Relative %: 54 %
Platelets: 222 10*3/uL (ref 150–400)
RBC: 3.49 MIL/uL — ABNORMAL LOW (ref 3.87–5.11)
RDW: 15.5 % (ref 11.5–15.5)
WBC: 7.9 10*3/uL (ref 4.0–10.5)
nRBC: 0 % (ref 0.0–0.2)

## 2019-10-30 LAB — BASIC METABOLIC PANEL
Anion gap: 8 (ref 5–15)
BUN: 7 mg/dL — ABNORMAL LOW (ref 8–23)
CO2: 28 mmol/L (ref 22–32)
Calcium: 8.1 mg/dL — ABNORMAL LOW (ref 8.9–10.3)
Chloride: 106 mmol/L (ref 98–111)
Creatinine, Ser: 0.71 mg/dL (ref 0.44–1.00)
GFR calc Af Amer: >60 mL/min (ref >60)
GFR calc non Af Amer: >60 mL/min (ref >60)
Glucose, Bld: 87 mg/dL (ref 70–99)
Potassium: 3.7 mmol/L (ref 3.5–5.1)
Sodium: 142 mmol/L (ref 135–145)

## 2019-10-30 LAB — SURGICAL PATHOLOGY

## 2019-10-30 MED ORDER — HYDROCODONE-ACETAMINOPHEN 5-325 MG PO TABS: 1.0000 | ORAL_TABLET | Freq: Four times a day (QID) | ORAL | 0 refills | Status: DC | PRN

## 2019-10-30 NOTE — Discharge Instructions (Signed)
Open Colectomy, Care After This sheet gives you information about how to care for yourself after your procedure. Your health care provider may also give you more specific instructions. If you have problems or questions, contact your health care provider. What can I expect after the procedure? After the procedure, it is common to have:  Pain in your abdomen, especially along your incision.  Tiredness. Your energy level will return to normal over the next several weeks.  Constipation.  Nausea.  Difficulty urinating. Follow these instructions at home: Activity  You may be able to return to most of your normal activities within 1-2 weeks, such as working, walking up stairs, and sexual activity.  Avoid activities that require a lot of energy for 4-6 weeks after surgery, such as running, climbing, and lifting heavy objects. Ask your health care provider what activities are safe for you.  Take rest breaks during the day as needed.  Do not drive for 1-2 weeks or until your health care provider says that it is safe.  Do not drive or use heavy machinery while taking prescription pain medicines.  Do not lift anything that is heavier than 10 lb (4.3 kg) until your health care provider says that it is safe. Incision care   Follow instructions from your health care provider about how to take care of your incision. Make sure you: ? Wash your hands with soap and water before you change your bandage (dressing). If soap and water are not available, use hand sanitizer. ? Change your dressing as told by your health care provider. ? Leave stitches (sutures) or staples in place. These skin closures may need to stay in place for 2 weeks or longer.  Avoid wearing tight clothing around your incision.  Protect your incision area from the sun.  Check your incision area every day for signs of infection. Check for: ? More redness, swelling, or pain. ? More fluid or blood. ? Warmth. ? Pus or a bad  smell. General instructions  Do not take baths, swim, or use a hot tub until your health care provider approves. Ask your health care provider when you may shower.  Take over-the-counter and prescription medicines, including stool softeners, only as told by your health care provider.  Eat a low-fat and low-fiber diet for the first 4 weeks after surgery.  Keep all follow-up visits as told by your health care provider. This is important. Contact a health care provider if:  You have more redness, swelling, or pain around your incision.  You have more fluid or blood coming from your incision.  Your incision feels warm to the touch.  You have pus or a bad smell coming from your incision.  You have a fever or chills.  You do not have a bowel movement 2-3 days after surgery.  You cannot eat or drink for 24 hours or more.  You have persistent nausea and vomiting.  You have abdominal pain that gets worse and does not get better with medicine. Get help right away if:  You have chest pain.  You have shortness of breath.  You have pain or swelling in your legs.  Your incision breaks open after your sutures or staples have been removed.  You have bleeding from the rectum. This information is not intended to replace advice given to you by your health care provider. Make sure you discuss any questions you have with your health care provider. Document Revised: 04/23/2017 Document Reviewed: 02/10/2016 Elsevier Patient Education  2020 Elsevier   Inc. ° °

## 2019-10-30 NOTE — Discharge Summary (Signed)
Physician Discharge Summary  Patient ID: Hailey Randolph MRN: [id number] DOB/AGE: 11-18-1950 69 y.o.  Admit date: 10/27/2019 Discharge date: 10/30/2019  Admission Diagnoses: Metastatic carcinoid tumor  Discharge Diagnoses: Same Principal Problem:   Carcinoid tumor of small intestine Active Problems:   S/P partial colectomy   Discharged Condition: good  Hospital Course: Patient is a 69 year old white female status post a partial small bowel resection for carcinoid tumor of the small bowel who was found on follow-up work-up to have an implant present on the surface of the cecum.  The patient underwent exploratory laparotomy, right hemicolectomy on 10/27/2019.  The implant was seen and a right hemicolectomy was performed, including the previously resected small bowel.  Patient tolerated the procedure well.  Her postoperative course was remarkable for mild hypokalemia and hypomagnesemia, both of which were treated.  Final pathology is pending.  Her diet was advanced without difficulty.  The patient is being discharged home on 10/30/2019 in good and improving condition.  Treatments: surgery: Right hemicolectomy on 10/27/2019  Discharge Exam: Blood pressure 115/66, pulse (!) 56, temperature 97.6 F (36.4 C), temperature source Oral, resp. rate 18, height '5\' 2"'$  (1.575 m), weight 77.1 kg, SpO2 97 %. General appearance: alert, cooperative and no distress Resp: clear to auscultation bilaterally Cardio: regular rate and rhythm, S1, S2 normal, no murmur, click, rub or gallop GI: Soft, incision healing well.  Disposition: Discharge disposition: 01-Home or Self Care       Discharge Instructions    Diet - low sodium heart healthy   Complete by: As directed    Increase activity slowly   Complete by: As directed      Allergies as of 10/30/2019   No Known Allergies     Medication List    STOP taking these medications   traMADol 50 MG tablet Commonly known as: ULTRAM     TAKE these  medications   acetaminophen 650 MG CR tablet Commonly known as: TYLENOL Take 650-1,300 mg by mouth every 8 (eight) hours as needed for pain.   atorvastatin 20 MG tablet Commonly known as: LIPITOR Take 20 mg by mouth at bedtime.   hydrochlorothiazide 25 MG tablet Commonly known as: HYDRODIURIL Take 25 mg by mouth daily.   HYDROcodone-acetaminophen 5-325 MG tablet Commonly known as: Norco Take 1 tablet by mouth every 6 (six) hours as needed for moderate pain.   levothyroxine 75 MCG tablet Commonly known as: SYNTHROID Take 75 mcg by mouth daily before breakfast.   LORazepam 1 MG tablet Commonly known as: ATIVAN Take 1 mg by mouth daily as needed for anxiety.   losartan 100 MG tablet Commonly known as: COZAAR Take 100 mg by mouth daily.   metroNIDAZOLE 500 MG tablet Commonly known as: Flagyl Take 1 tablet (500 mg total) by mouth 3 (three) times daily. Take 1 tablet at 1pm, 2pm, and 9pm the day before surgery   neomycin 500 MG tablet Commonly known as: MYCIFRADIN Take 2 tablets (1,000 mg total) by mouth 3 (three) times daily. Take two tablets at 1pm, 2pm, and 9pm the day before surgery   potassium chloride SA 20 MEQ tablet Commonly known as: KLOR-CON Take 1 tablet (20 mEq total) by mouth 3 (three) times daily.   Suprep Bowel Prep Kit 17.5-3.13-1.6 GM/177ML Soln Generic drug: Na Sulfate-K Sulfate-Mg Sulf Take 177 mLs by mouth once.   zolpidem 10 MG tablet Commonly known as: AMBIEN Take 5 mg by mouth at bedtime as needed for sleep.      Follow-up  Information    Aviva Signs, MD. Schedule an appointment as soon as possible for a visit on 11/07/2019.   Specialty: General Surgery Contact information: [id number] [address] [phone]           Signed: Aviva Signs 10/30/2019, 10:39 AM

## 2019-11-01 ENCOUNTER — Telehealth: Payer: Self-pay | Admitting: Family Medicine

## 2019-11-01 MED ORDER — FLUCONAZOLE 200 MG PO TABS
200.0000 mg | ORAL_TABLET | Freq: Once | ORAL | 1 refills | Status: AC
Start: 2019-11-01 — End: 2019-11-01

## 2019-11-01 NOTE — Telephone Encounter (Signed)
Pt called and states that she thinks she has a yeast infection. She is having horrific itching and redness and states that "it is driving her crazy" could we please call her in something?   Message sent via text to Dr. Arnoldo Morale with approval of Diflucan. Med sent to pharm and pt made aware.

## 2019-11-07 ENCOUNTER — Ambulatory Visit (INDEPENDENT_AMBULATORY_CARE_PROVIDER_SITE_OTHER): Payer: Self-pay | Admitting: General Surgery

## 2019-11-07 ENCOUNTER — Encounter: Payer: Self-pay | Admitting: General Surgery

## 2019-11-07 ENCOUNTER — Other Ambulatory Visit: Payer: Self-pay

## 2019-11-07 VITALS — BP 85/65 | HR 80 | Temp 97.2°F | Resp 14 | Ht 63.0 in | Wt 162.0 lb

## 2019-11-07 DIAGNOSIS — Z09 Encounter for follow-up examination after completed treatment for conditions other than malignant neoplasm: Secondary | ICD-10-CM

## 2019-11-07 NOTE — Progress Notes (Signed)
Subjective:     Hailey Randolph  Patient here for postoperative visit, status post right hemicolectomy for metastatic neuroendocrine tumor.  She is doing very well.  She had some loose stools initially, but this is resolving.  She denies any fever, chills, incisional pain, or nausea. Objective:    BP (!) 85/65   Pulse 80   Temp (!) 97.2 F (36.2 C) (Other (Comment))   Resp 14   Ht 5\' 3"  (1.6 m)   Wt 162 lb (73.5 kg)   SpO2 97%   BMI 28.70 kg/m   General:  alert, cooperative and no distress  Abdomen soft, incision healing well.  Staples removed, Steri-Strips applied. Final pathology reveals the tumor to be in the specimen removed with 25 lymph nodes negative for metastatic disease.  Patient is aware.     Assessment:    Doing well postoperatively.    Plan:   Follow-up here as needed.  Patient will be seeing Dr. Delton Coombes of oncology in early July.

## 2019-11-28 ENCOUNTER — Inpatient Hospital Stay (HOSPITAL_BASED_OUTPATIENT_CLINIC_OR_DEPARTMENT_OTHER): Payer: Medicare Other | Admitting: Hematology

## 2019-11-28 ENCOUNTER — Inpatient Hospital Stay (HOSPITAL_COMMUNITY): Payer: Medicare Other | Attending: Hematology

## 2019-11-28 ENCOUNTER — Other Ambulatory Visit: Payer: Self-pay

## 2019-11-28 VITALS — BP 120/76 | HR 73 | Temp 97.5°F | Resp 18 | Wt 163.8 lb

## 2019-11-28 DIAGNOSIS — I10 Essential (primary) hypertension: Secondary | ICD-10-CM | POA: Diagnosis not present

## 2019-11-28 DIAGNOSIS — D3A019 Benign carcinoid tumor of the small intestine, unspecified portion: Secondary | ICD-10-CM

## 2019-11-28 DIAGNOSIS — E039 Hypothyroidism, unspecified: Secondary | ICD-10-CM | POA: Insufficient documentation

## 2019-11-28 DIAGNOSIS — C7A019 Malignant carcinoid tumor of the small intestine, unspecified portion: Secondary | ICD-10-CM | POA: Diagnosis not present

## 2019-11-28 DIAGNOSIS — F419 Anxiety disorder, unspecified: Secondary | ICD-10-CM | POA: Insufficient documentation

## 2019-11-28 DIAGNOSIS — Z9049 Acquired absence of other specified parts of digestive tract: Secondary | ICD-10-CM | POA: Insufficient documentation

## 2019-11-28 DIAGNOSIS — Z79899 Other long term (current) drug therapy: Secondary | ICD-10-CM | POA: Insufficient documentation

## 2019-11-28 DIAGNOSIS — E785 Hyperlipidemia, unspecified: Secondary | ICD-10-CM | POA: Insufficient documentation

## 2019-11-28 DIAGNOSIS — Z90710 Acquired absence of both cervix and uterus: Secondary | ICD-10-CM | POA: Diagnosis not present

## 2019-11-28 NOTE — Progress Notes (Signed)
[place], [place] [address]   CLINIC:  Medical Oncology/Hematology  PCP:  Asencion Noble, MD [address] 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  PRIOR THERAPY:  1. Small bowel resection on 07/24/2019. 2. Right hemi-colectomy on 10/27/2019.  NGS Results: Not applicable.  CURRENT THERAPY: Observation.  BRIEF ONCOLOGIC HISTORY:  Oncology History   No history exists.    CANCER STAGING: Cancer Staging Carcinoid tumor of small intestine Staging form: Small Intestine - Other Histologies, AJCC 8th Edition - Clinical stage from 08/10/2019: cT4, cN0, cM0 - Unsigned   INTERVAL HISTORY:  [name], a 69 y.o. female, returns for routine follow-up of her carcinoid tumor of small intestine. Hailey Randolph was last seen on 09/19/2019.   She had a right hemicoletomy on 10/27/2019 at [facility] and she tolerated it well.  Today she reports that her diarrhea has been going on for several days, but today her BM was normal. Her energy levels have not improved as quickly as before, but it is improving. She last lost 35 lbs over the past several months.   She had a mammogram done recently. She continues taking probiotic tablets daily.   REVIEW OF SYSTEMS:  Review of Systems  Constitutional: Positive for appetite change (moderately decreased) and fatigue (severe, improving).  Respiratory: Negative for cough, shortness of breath and wheezing.   Gastrointestinal: Positive for diarrhea.  All other systems reviewed and are negative.   PAST MEDICAL/SURGICAL HISTORY:  Past Medical History:  Diagnosis Date  . Adenomatous colon polyp   . Anxiety   . HTN (hypertension)   . Hyperlipidemia   . Hypothyroidism    Past Surgical History:  Procedure Laterality Date  . ABDOMINAL HYSTERECTOMY    . APPENDECTOMY    . BIOPSY  08/14/2016   Procedure: BIOPSY;   Surgeon: Daneil Dolin, MD;  Location: [facility];  Service: Endoscopy;;  gastric  . BOWEL RESECTION N/A 07/24/2019   Procedure: PARTIAL SMALL BOWEL RESECTION;  Surgeon: Aviva Signs, MD;  Location: [facility];  Service: General;  Laterality: N/A;  . BREAST EXCISIONAL BIOPSY Left 10/30/2016  . BREAST EXCISIONAL BIOPSY Right 10/30/2016  . COLONOSCOPY  April 2007   Adenomatous polyp, pedunculated, at 30 cm. Scattered left-sided diverticula  . COLONOSCOPY N/A 01/17/2016   Dr. Gala Romney: 5 mm polyp in the cecum, tubular adenoma. Scattered small and large mouth diverticula in the sigmoid colon. Next colonoscopy 5 years.  . ESOPHAGOGASTRODUODENOSCOPY N/A 08/14/2016   Procedure: ESOPHAGOGASTRODUODENOSCOPY (EGD);  Surgeon: Daneil Dolin, MD;  Location: [facility];  Service: Endoscopy;  Laterality: N/A;  215  . hysterectomy     complete  . LAPAROTOMY N/A 07/24/2019   Procedure: EXPLORATORY LAPAROTOMY;  Surgeon: Aviva Signs, MD;  Location: [facility];  Service: General;  Laterality: N/A;  . PARTIAL COLECTOMY N/A 10/27/2019   Procedure: RIGHT HEMI-COLECTOMY;  Surgeon: Aviva Signs, MD;  Location: [facility];  Service: General;  Laterality: N/A;  . RADIOACTIVE SEED GUIDED EXCISIONAL BREAST BIOPSY Bilateral 11/02/2016   Procedure: BILATERAL RADIOACTIVE SEED GUIDED EXCISIONAL BREAST BIOPSY LEFT BREAST 2 SEEDS RIGHT BREAST 1 SEED;  Surgeon: Rolm Bookbinder, MD;  Location: [place];  Service: General;  Laterality: Bilateral;  . TONSILLECTOMY      SOCIAL HISTORY:  Social History   Socioeconomic History  . Marital status: Widowed    Spouse name: Not on file  .  Number of children: 2  . Years of education: Not on file  . Highest education level: Not on file  Occupational History  . Occupation: retired    Comment: Research officer, trade union division  Tobacco Use  . Smoking status: Never Smoker  . Smokeless tobacco: Never Used  Vaping Use  . Vaping Use: Never used  Substance and Sexual Activity  . Alcohol  use: No  . Drug use: No  . Sexual activity: Not Currently  Other Topics Concern  . Not on file  Social History Narrative  . Not on file   Social Determinants of Health   Financial Resource Strain: Low Risk   . Difficulty of Paying Living Expenses: Not hard at all  Food Insecurity: No Food Insecurity  . Worried About Charity fundraiser in the Last Year: Never true  . Ran Out of Food in the Last Year: Never true  Transportation Needs: No Transportation Needs  . Lack of Transportation (Medical): No  . Lack of Transportation (Non-Medical): No  Physical Activity: Insufficiently Active  . Days of Exercise per Week: 3 days  . Minutes of Exercise per Session: 30 min  Stress: No Stress Concern Present  . Feeling of Stress : Only a little  Social Connections: Moderately Isolated  . Frequency of Communication with Friends and Family: More than three times a week  . Frequency of Social Gatherings with Friends and Family: More than three times a week  . Attends Religious Services: More than 4 times per year  . Active Member of Clubs or Organizations: No  . Attends Archivist Meetings: Never  . Marital Status: Widowed  Intimate Partner Violence: Not At Risk  . Fear of Current or Ex-Partner: No  . Emotionally Abused: No  . Physically Abused: No  . Sexually Abused: No    FAMILY HISTORY:  Family History  Problem Relation Age of Onset  . Colon polyps Mother   . Breast cancer Mother 66  . Heart attack Father 34       deceased  . Arthritis Sister   . Liver disease Neg Hx     CURRENT MEDICATIONS:  Current Outpatient Medications  Medication Sig Dispense Refill  . atorvastatin (LIPITOR) 20 MG tablet Take 20 mg by mouth at bedtime.     . hydrochlorothiazide (HYDRODIURIL) 25 MG tablet Take 25 mg by mouth daily.    Marland Kitchen levothyroxine (SYNTHROID, LEVOTHROID) 75 MCG tablet Take 75 mcg by mouth daily before breakfast.     . losartan (COZAAR) 100 MG tablet Take 100 mg by mouth daily.     Marland Kitchen acetaminophen (TYLENOL) 650 MG CR tablet Take 650-1,300 mg by mouth every 8 (eight) hours as needed for pain. (Patient not taking: Reported on 11/28/2019)    . LORazepam (ATIVAN) 1 MG tablet Take 1 mg by mouth daily as needed for anxiety.  (Patient not taking: Reported on 11/28/2019)    . zolpidem (AMBIEN) 10 MG tablet Take 5 mg by mouth at bedtime as needed for sleep.  (Patient not taking: Reported on 11/28/2019)     No current facility-administered medications for this visit.    ALLERGIES:  No Known Allergies  PHYSICAL EXAM:  Performance status (ECOG): 1 - Symptomatic but completely ambulatory  Vitals:   11/28/19 1539  BP: 120/76  Pulse: 73  Resp: 18  Temp: (!) 97.5 F (36.4 C)  SpO2: 99%   Wt Readings from Last 3 Encounters:  11/28/19 74.3 kg (163 lb 12.8 oz)  11/07/19 73.5  kg (162 lb)  10/27/19 77.1 kg (169 lb 15.6 oz)   Physical Exam Vitals reviewed.  Constitutional:      Appearance: Normal appearance.  Cardiovascular:     Rate and Rhythm: Normal rate and regular rhythm.     Pulses: Normal pulses.     Heart sounds: Normal heart sounds.  Pulmonary:     Effort: Pulmonary effort is normal.     Breath sounds: Normal breath sounds.  Abdominal:     Palpations: Abdomen is soft. There is no mass.     Tenderness: There is no abdominal tenderness.     Hernia: No hernia is present.  Musculoskeletal:     Right lower leg: No edema.     Left lower leg: No edema.  Neurological:     General: No focal deficit present.     Mental Status: She is alert and oriented to person, place, and time.  Psychiatric:        Mood and Affect: Mood normal.        Behavior: Behavior normal.      LABORATORY DATA:  I have reviewed the labs as listed.  CBC Latest Ref Rng & Units 10/30/2019 10/29/2019 10/28/2019  WBC 4.0 - 10.5 K/uL 7.9 10.5 13.2(H)  Hemoglobin 12.0 - 15.0 g/dL 10.6(L) 10.4(L) 11.1(L)  Hematocrit 36 - 46 % 34.0(L) 33.1(L) 33.8(L)  Platelets 150 - 400 K/uL 222 234 252   CMP  Latest Ref Rng & Units 10/30/2019 10/29/2019 10/28/2019  Glucose 70 - 99 mg/dL 87 91 119(H)  BUN 8 - 23 mg/dL 7(L) 8 7(L)  Creatinine 0.44 - 1.00 mg/dL 0.71 0.66 0.64  Sodium 135 - 145 mmol/L 142 140 139  Potassium 3.5 - 5.1 mmol/L 3.7 3.8 3.6  Chloride 98 - 111 mmol/L 106 106 103  CO2 22 - 32 mmol/L _0 Calcium 8.9 - 10.3 mg/dL 8.1(L) 7.8(L) 8.3(L)  Total Protein 6.5 - 8.1 g/dL - - -  Total Bilirubin 0.3 - 1.2 mg/dL - - -  Alkaline Phos 38 - 126 U/L - - -  AST 15 - 41 U/L - - -  ALT 0 - 44 U/L - - -    DIAGNOSTIC IMAGING:  I have independently reviewed the scans and discussed with the patient.   ASSESSMENT:  1.  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 -Exploratory laparotomy and resection of small bowel on 07/24/2019. -Pathology with grade 1 well-differentiated neuroendocrine tumor, 2 cm, unifocal, mitotic index 1/10 hpf, Ki-67 1%, tumor invades into the serosal surface, margins negative, no LVI, 2 tumor deposits measuring 0.4 cm and 0.5 cm, 0/18 lymph nodes positive, PT4PN0. -24-hour urine for 5-HIAA was negative.  Serum chromogranin was 54.7. -PET scan on 09/04/2019 showed single focus of radiotracer avid nodularity along the serosal surface of the cecum measuring 8 mm consistent with well-differentiated neuroendocrine tumor metastasis.  No evidence of residual tumor at the anastomosis.  No evidence of liver mets.  No evidence of distal disease. -Right hemicolectomy on 10/27/2019 which showed well-differentiated neuroendocrine tumor, 1 cm involving the wall of the cecum, grade 1..  0/25 lymph nodes involved.  2.  Left breast usual ductal hyperplasia: -Lumpectomy of the left breast on 11/02/2016 showing UDH, fibrocystic changes.  No evidence of malignancy. -Family history of breast cancer in the mother at age 56.   PLAN:  1.  Well-differentiated  neuroendocrine tumor of the small  bowel: -I have discussed the pathology report in detail.  Chromogranin level today is 80.3. -I have recommended follow-up visit in 4 months.  We will obtain chromogranin and 24-hour urine for 5-HIAA prior to next visit. -Plan to repeat scan in 6 months.  2.  Left breast usual ductal hyperplasia: -Reviewed mammogram from 09/20/2019, BI-RADS Category 1.    Orders placed this encounter:  Orders Placed This Encounter  Procedures  . 5 HIAA, quantitative, urine, 24 hour  . Chromogranin A  . CBC with Differential  . Comprehensive metabolic panel     Derek Jack, MD [place] [phone]   I, Milinda Antis, am acting as a scribe for Dr. Sanda Linger.  I, Derek Jack MD, have reviewed the above documentation for accuracy and completeness, and I agree with the above.

## 2019-11-28 NOTE — Patient Instructions (Signed)
[place] at [facility] Discharge Instructions  You were seen today by Dr. Delton Coombes. He went over your recent test results. He will see you back in 4 months for labs and follow up.   Thank you for choosing [place] at [facility] to provide your oncology and hematology care.  To afford each patient quality time with our provider, please arrive at least 15 minutes before your scheduled appointment time.   If you have a lab appointment with the [facility] please come in thru the  Main Entrance and check in at the main information desk  You need to re-schedule your appointment should you arrive 10 or more minutes late.  We strive to give you quality time with our providers, and arriving late affects you and other patients whose appointments are after yours.  Also, if you no show three or more times for appointments you may be dismissed from the clinic at the providers discretion.     Again, thank you for choosing [facility].  Our hope is that these requests will decrease the amount of time that you wait before being seen by our physicians.       _____________________________________________________________  Should you have questions after your visit to [facility], please contact our office at [phone] between the hours of 8:00 a.m. and 4:30 p.m.  Voicemails left after 4:00 p.m. will not be returned until the following business day.  For prescription refill requests, have your pharmacy contact our office and allow 72 hours.    Cancer Center Support Programs:   > Cancer Support Group  2nd Tuesday of the month 1pm-2pm, Journey Room

## 2019-11-30 LAB — CHROMOGRANIN A: Chromogranin A (ng/mL): 80.3 ng/mL (ref 0.0–101.8)

## 2019-12-15 DIAGNOSIS — I10 Essential (primary) hypertension: Secondary | ICD-10-CM | POA: Diagnosis not present

## 2019-12-15 DIAGNOSIS — C7A019 Malignant carcinoid tumor of the small intestine, unspecified portion: Secondary | ICD-10-CM | POA: Diagnosis not present

## 2020-02-26 DIAGNOSIS — Z23 Encounter for immunization: Secondary | ICD-10-CM | POA: Diagnosis not present

## 2020-04-01 ENCOUNTER — Other Ambulatory Visit: Payer: Self-pay

## 2020-04-01 ENCOUNTER — Inpatient Hospital Stay (HOSPITAL_COMMUNITY): Payer: Medicare Other | Attending: Hematology

## 2020-04-01 DIAGNOSIS — D3A019 Benign carcinoid tumor of the small intestine, unspecified portion: Secondary | ICD-10-CM

## 2020-04-01 DIAGNOSIS — C7A019 Malignant carcinoid tumor of the small intestine, unspecified portion: Secondary | ICD-10-CM | POA: Diagnosis not present

## 2020-04-01 DIAGNOSIS — E785 Hyperlipidemia, unspecified: Secondary | ICD-10-CM | POA: Diagnosis not present

## 2020-04-01 DIAGNOSIS — E039 Hypothyroidism, unspecified: Secondary | ICD-10-CM | POA: Diagnosis not present

## 2020-04-01 LAB — CBC WITH DIFFERENTIAL/PLATELET
Abs Immature Granulocytes: 0.03 10*3/uL (ref 0.00–0.07)
Basophils Absolute: 0.1 10*3/uL (ref 0.0–0.1)
Basophils Relative: 1 %
Eosinophils Absolute: 0.1 10*3/uL (ref 0.0–0.5)
Eosinophils Relative: 1 %
HCT: 43.3 % (ref 36.0–46.0)
Hemoglobin: 14.1 g/dL (ref 12.0–15.0)
Immature Granulocytes: 0 %
Lymphocytes Relative: 32 %
Lymphs Abs: 3.2 10*3/uL (ref 0.7–4.0)
MCH: 30.6 pg (ref 26.0–34.0)
MCHC: 32.6 g/dL (ref 30.0–36.0)
MCV: 93.9 fL (ref 80.0–100.0)
Monocytes Absolute: 0.7 10*3/uL (ref 0.1–1.0)
Monocytes Relative: 7 %
Neutro Abs: 5.8 10*3/uL (ref 1.7–7.7)
Neutrophils Relative %: 59 %
Platelets: 330 10*3/uL (ref 150–400)
RBC: 4.61 MIL/uL (ref 3.87–5.11)
RDW: 13.2 % (ref 11.5–15.5)
WBC: 9.9 10*3/uL (ref 4.0–10.5)
nRBC: 0 % (ref 0.0–0.2)

## 2020-04-01 LAB — COMPREHENSIVE METABOLIC PANEL
ALT: 28 U/L (ref 0–44)
AST: 22 U/L (ref 15–41)
Albumin: 3.9 g/dL (ref 3.5–5.0)
Alkaline Phosphatase: 71 U/L (ref 38–126)
Anion gap: 11 (ref 5–15)
BUN: 10 mg/dL (ref 8–23)
CO2: 28 mmol/L (ref 22–32)
Calcium: 9.4 mg/dL (ref 8.9–10.3)
Chloride: 98 mmol/L (ref 98–111)
Creatinine, Ser: 0.74 mg/dL (ref 0.44–1.00)
GFR, Estimated: >60 mL/min (ref >60)
Glucose, Bld: 86 mg/dL (ref 70–99)
Potassium: 3.4 mmol/L — ABNORMAL LOW (ref 3.5–5.1)
Sodium: 137 mmol/L (ref 135–145)
Total Bilirubin: 0.7 mg/dL (ref 0.3–1.2)
Total Protein: 7.1 g/dL (ref 6.5–8.1)

## 2020-04-02 LAB — CHROMOGRANIN A: Chromogranin A (ng/mL): 75.1 ng/mL (ref 0.0–101.8)

## 2020-04-03 DIAGNOSIS — E785 Hyperlipidemia, unspecified: Secondary | ICD-10-CM | POA: Diagnosis not present

## 2020-04-03 DIAGNOSIS — C7A019 Malignant carcinoid tumor of the small intestine, unspecified portion: Secondary | ICD-10-CM | POA: Diagnosis not present

## 2020-04-03 DIAGNOSIS — E039 Hypothyroidism, unspecified: Secondary | ICD-10-CM | POA: Diagnosis not present

## 2020-04-08 ENCOUNTER — Inpatient Hospital Stay (HOSPITAL_BASED_OUTPATIENT_CLINIC_OR_DEPARTMENT_OTHER): Payer: Medicare Other | Admitting: Hematology

## 2020-04-08 ENCOUNTER — Other Ambulatory Visit: Payer: Self-pay

## 2020-04-08 VITALS — BP 124/85 | HR 65 | Temp 96.8°F | Resp 16 | Wt 169.1 lb

## 2020-04-08 DIAGNOSIS — E785 Hyperlipidemia, unspecified: Secondary | ICD-10-CM | POA: Diagnosis not present

## 2020-04-08 DIAGNOSIS — E039 Hypothyroidism, unspecified: Secondary | ICD-10-CM | POA: Diagnosis not present

## 2020-04-08 DIAGNOSIS — D3A019 Benign carcinoid tumor of the small intestine, unspecified portion: Secondary | ICD-10-CM | POA: Diagnosis not present

## 2020-04-08 DIAGNOSIS — C7A019 Malignant carcinoid tumor of the small intestine, unspecified portion: Secondary | ICD-10-CM | POA: Diagnosis not present

## 2020-04-08 LAB — 5 HIAA, QUANTITATIVE, URINE, 24 HOUR
5-HIAA, Ur: 1.4 mg/L
5-HIAA,Quant.,24 Hr Urine: 2.8 mg/24 hr (ref 0.0–14.9)
Total Volume: 2000

## 2020-04-08 NOTE — Progress Notes (Signed)
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 Small bowel resection on 07/24/2019. 2. Right hemi-colectomy on 10/27/2019.  NGS Results: Not done  CURRENT THERAPY: Observation  BRIEF ONCOLOGIC HISTORY:  Oncology History   No history exists.    CANCER STAGING: Cancer Staging Carcinoid tumor of small intestine Staging form: Small Intestine - Other Histologies, AJCC 8th Edition - Clinical stage from 08/10/2019: cT4, cN0, cM0 - Unsigned   INTERVAL HISTORY:  [name], a 69 y.o. 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 Hailey Randolph was last seen on 11/28/2019.   Today she reports feeling well. She reports having passing pain in her right inguinal area, but reports that it is not bothersome and denies abdominal pain or cramping. Her incision has healed and she denies having issues with it. She denies having wheezing, diarrhea, or cutaneous flushing.   REVIEW OF SYSTEMS:  Review of Systems  Constitutional: Positive for fatigue (90%). Negative for appetite change.  Respiratory: Negative for wheezing.   Gastrointestinal: Negative for abdominal pain and diarrhea.  Skin: Negative for rash.  All other systems reviewed and are negative.   PAST MEDICAL/SURGICAL HISTORY:  Past Medical History:  Diagnosis Date   Adenomatous colon polyp    Anxiety    HTN (hypertension)    Hyperlipidemia    Hypothyroidism    Past Surgical History:  Procedure Laterality Date   ABDOMINAL HYSTERECTOMY     APPENDECTOMY     BIOPSY  08/14/2016   Procedure: BIOPSY;  Surgeon: Daneil Dolin, MD;  Location: [facility];  Service: Endoscopy;;  gastric   BOWEL  RESECTION N/A 07/24/2019   Procedure: PARTIAL SMALL BOWEL RESECTION;  Surgeon: Aviva Signs, MD;  Location: [facility];  Service: General;  Laterality: N/A;   BREAST EXCISIONAL BIOPSY Left 10/30/2016   BREAST EXCISIONAL BIOPSY Right 10/30/2016   COLONOSCOPY  April 2007   Adenomatous polyp, pedunculated, at 30 cm. Scattered left-sided diverticula   COLONOSCOPY N/A 01/17/2016   Dr. Gala Romney: 5 mm polyp in the cecum, tubular adenoma. Scattered small and large mouth diverticula in the sigmoid colon. Next colonoscopy 5 years.   ESOPHAGOGASTRODUODENOSCOPY N/A 08/14/2016   Procedure: ESOPHAGOGASTRODUODENOSCOPY (EGD);  Surgeon: Daneil Dolin, MD;  Location: [facility];  Service: Endoscopy;  Laterality: N/A;  215   hysterectomy     complete   LAPAROTOMY N/A 07/24/2019   Procedure: EXPLORATORY LAPAROTOMY;  Surgeon: Aviva Signs, MD;  Location: [facility];  Service: General;  Laterality: N/A;   PARTIAL COLECTOMY N/A 10/27/2019   Procedure: RIGHT HEMI-COLECTOMY;  Surgeon: Aviva Signs, MD;  Location: [facility];  Service: General;  Laterality: N/A;   RADIOACTIVE SEED GUIDED EXCISIONAL BREAST BIOPSY Bilateral 11/02/2016   Procedure: BILATERAL RADIOACTIVE SEED GUIDED EXCISIONAL BREAST BIOPSY LEFT BREAST 2 SEEDS RIGHT BREAST 1 SEED;  Surgeon: Rolm Bookbinder, MD;  Location: [place];  Service: General;  Laterality: Bilateral;   TONSILLECTOMY      SOCIAL HISTORY:  Social History   Socioeconomic History   Marital status: Widowed    Spouse name: Not on file   Number of children: 2   Years of education: Not on file   Highest education level: Not on file  Occupational History   Occupation: retired    Comment: Research officer, trade union division  Tobacco Use   Smoking status: Never Smoker   Smokeless tobacco: Never Used  Scientific laboratory technician Use: Never used  Substance and Sexual Activity   Alcohol use: No   Drug use: No   Sexual activity: Not Currently  Other Topics Concern   Not on  file  Social History Narrative   Not on file   Social Determinants of Health   Financial Resource Strain: Low Risk    Difficulty of Paying Living Expenses: Not hard at all  Food Insecurity: No Food Insecurity   Worried About Charity fundraiser in the Last Year: Never true   [place] in the Last Year: Never true  Transportation Needs: No Transportation Needs   Lack of Transportation (Medical): No   Lack of Transportation (Non-Medical): No  Physical Activity: Insufficiently Active   Days of Exercise per Week: 3 days   Minutes of Exercise per Session: 30 min  Stress: No Stress Concern Present   Feeling of Stress : Only a little  Social Connections: Moderately Isolated   Frequency of Communication with Friends and Family: More than three times a week   Frequency of Social Gatherings with Friends and Family: More than three times a week   Attends Religious Services: More than 4 times per year   Active Member of [employer] or Organizations: No   Attends Archivist Meetings: Never   Marital Status: Widowed  Human resources officer Violence: Not At Risk   Fear of Current or Ex-Partner: No   Emotionally Abused: No   Physically Abused: No   Sexually Abused: No    FAMILY HISTORY:  Family History  Problem Relation Age of Onset   Colon polyps Mother    Breast cancer Mother 68   Heart attack Father 52       deceased   Arthritis Sister    Liver disease Neg Hx     CURRENT MEDICATIONS:  Current Outpatient Medications  Medication Sig Dispense Refill   acetaminophen (TYLENOL) 650 MG CR tablet Take 650-1,300 mg by mouth every 8 (eight) hours as needed for pain.      atorvastatin (LIPITOR) 20 MG tablet Take 20 mg by mouth at bedtime.      hydrochlorothiazide (HYDRODIURIL) 25 MG tablet Take 25 mg by mouth daily.     levothyroxine (SYNTHROID, LEVOTHROID) 75 MCG tablet Take 75 mcg by mouth daily before breakfast.      LORazepam (ATIVAN) 1 MG tablet Take 1  mg by mouth daily as needed for anxiety.      losartan (COZAAR) 100 MG tablet Take 100 mg by mouth daily.     traMADol (ULTRAM) 50 MG tablet Take 50 mg by mouth every 6 (six) hours as needed. (Patient not taking: Reported on 04/08/2020)     zolpidem (AMBIEN) 10 MG tablet Take 5 mg by mouth at bedtime as needed for sleep.  (Patient not taking: Reported on 04/08/2020)     No current facility-administered medications for this visit.    ALLERGIES:  No Known Allergies  PHYSICAL EXAM:  Performance status (ECOG): 1 - Symptomatic but completely ambulatory  Vitals:   04/08/20 1121  BP: 124/85  Pulse: 65  Resp: 16  Temp: (!) 96.8 F (36 C)  SpO2: 95%   Wt Readings from Last 3 Encounters:  04/08/20 169 lb 1.6 oz (76.7 kg)  11/28/19 163 lb 12.8 oz (74.3 kg)  11/07/19 162 lb (73.5 kg)   Physical Exam Vitals reviewed.  Constitutional:      Appearance: Normal appearance.  Cardiovascular:     Rate and Rhythm: Normal rate and regular rhythm.     Pulses: Normal pulses.     Heart sounds: Normal heart sounds.  Pulmonary:     Effort: Pulmonary effort is normal.     Breath sounds: Normal breath sounds.  Abdominal:     Palpations: Abdomen is soft. There is no hepatomegaly, splenomegaly or mass.     Tenderness: There is no abdominal tenderness.     Hernia: No hernia is present.  Neurological:     General: No focal deficit present.     Mental Status: She is alert and oriented to person, place, and time.  Psychiatric:        Mood and Affect: Mood normal.        Behavior: Behavior normal.      LABORATORY DATA:  I have reviewed the labs as listed.  CBC Latest Ref Rng & Units 04/01/2020 10/30/2019 10/29/2019  WBC 4.0 - 10.5 K/uL 9.9 7.9 10.5  Hemoglobin 12.0 - 15.0 g/dL 14.1 10.6(L) 10.4(L)  Hematocrit 36 - 46 % 43.3 34.0(L) 33.1(L)  Platelets 150 - 400 K/uL 330 222 234   CMP Latest Ref Rng & Units 04/01/2020 10/30/2019 10/29/2019  Glucose 70 - 99 mg/dL 86 87 91  BUN 8 - 23 mg/dL 10 7(L) 8   Creatinine 0.44 - 1.00 mg/dL 0.74 0.71 0.66  Sodium 135 - 145 mmol/L 137 142 140  Potassium 3.5 - 5.1 mmol/L 3.4(L) 3.7 3.8  Chloride 98 - 111 mmol/L 98 106 106  CO2 22 - 32 mmol/L $RemoveB'28 28 26  '[id number]$ Calcium 8.9 - 10.3 mg/dL 9.4 8.1(L) 7.8(L)  Total Protein 6.5 - 8.1 g/dL 7.1 - -  Total Bilirubin 0.3 - 1.2 mg/dL 0.7 - -  Alkaline Phos 38 - 126 U/L 71 - -  AST 15 - 41 U/L 22 - -  ALT 0 - 44 U/L 28 - -    DIAGNOSTIC IMAGING:  I have independently reviewed the scans and discussed with the patient. No results found.   ASSESSMENT:  1. 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 -Exploratory laparotomy and resection of small bowel on 07/24/2019. -Pathology with grade 1 well-differentiated neuroendocrine tumor, 2 cm, unifocal, mitotic index 1/10 hpf, Ki-67 1%, tumor invades into the serosal surface, margins negative, no LVI, 2 tumor deposits measuring 0.4 cm and 0.5 cm, 0/18 lymph nodes positive, PT4PN0. -24-hour urine for 5-HIAA was negative. Serum chromogranin was 54.7. -PET scan on 09/04/2019 showed single focus of radiotracer avid nodularity along the serosal surface of the cecum measuring 8 mm consistent with well-differentiated neuroendocrine tumor metastasis. No evidence of residual tumor at the anastomosis. No evidence of liver mets. No evidence of distal disease. -Right hemicolectomy on 10/27/2019 which showed well-differentiated neuroendocrine tumor, 1 cm involving the wall of the cecum, grade 1..  0/25 lymph nodes involved.  2.  Left breast usual ductal hyperplasia: -Lumpectomy of the left breast on 11/02/2016 showing UDH, fibrocystic changes.  No evidence of malignancy. -Family history of breast cancer in the mother at age 87.   PLAN:  1. 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 -Reviewed labs from 04/01/2020.  Chromogranin level is 75. -24-hour 5-HIAA was normal.  LFTs were  normal. -Physical exam did not show any palpable masses.  Recommend return to clinic in 3 months.  Recommend CTAP with contrast prior to next visit.  2.  Left breast usual ductal hyperplasia: -Mammogram on 09/20/2019 was BI-RADS Category 1.   Orders placed this encounter:  Orders Placed This Encounter  Procedures   CT Abdomen Pelvis W Contrast   CBC with Differential/Platelet   Comprehensive metabolic panel   Chromogranin A     Derek Jack, MD [place] [phone]   I, Milinda Antis, am acting as a scribe for Dr. Sanda Linger.  I, Derek Jack MD, have reviewed the above documentation for accuracy and completeness, and I agree with the above.

## 2020-04-08 NOTE — Patient Instructions (Signed)
[facility] at [facility] °Discharge Instructions ° °You were seen today by Dr. Katragadda. He went over your recent results. You will be scheduled for a CT scan of your abdomen before your next visit. Dr. Katragadda will see you back in 3 months for labs and follow up. ° ° °Thank you for choosing [facility] at [facility] to provide your oncology and hematology care.  To afford each patient quality time with our provider, please arrive at least 15 minutes before your scheduled appointment time.  ° °If you have a lab appointment with the Cancer Center please come in thru the Main Entrance and check in at the main information desk ° °You need to re-schedule your appointment should you arrive 10 or more minutes late.  We strive to give you quality time with our providers, and arriving late affects you and other patients whose appointments are after yours.  Also, if you no show three or more times for appointments you may be dismissed from the clinic at the providers discretion.     °Again, thank you for choosing [facility].  Our hope is that these requests will decrease the amount of time that you wait before being seen by our physicians.       °_____________________________________________________________ ° °Should you have questions after your visit to [facility], please contact our office at [phone] between the hours of 8:00 a.m. and 4:30 p.m.  Voicemails left after 4:00 p.m. will not be returned until the following business day.  For prescription refill requests, have your pharmacy contact our office and allow 72 hours.   ° °Cancer Center Support Programs:  ° °> Cancer Support Group  °2nd Tuesday of the month 1pm-2pm, Journey Room  ° ° °

## 2020-04-09 DIAGNOSIS — Z23 Encounter for immunization: Secondary | ICD-10-CM | POA: Diagnosis not present

## 2020-05-02 ENCOUNTER — Other Ambulatory Visit (HOSPITAL_COMMUNITY): Payer: Self-pay

## 2020-05-02 MED ORDER — PROCHLORPERAZINE MALEATE 10 MG PO TABS: 10.0000 mg | ORAL_TABLET | Freq: Four times a day (QID) | ORAL | 0 refills | Status: DC | PRN

## 2020-06-06 DIAGNOSIS — C7A019 Malignant carcinoid tumor of the small intestine, unspecified portion: Secondary | ICD-10-CM | POA: Diagnosis not present

## 2020-06-06 DIAGNOSIS — Z79899 Other long term (current) drug therapy: Secondary | ICD-10-CM | POA: Diagnosis not present

## 2020-06-06 DIAGNOSIS — F419 Anxiety disorder, unspecified: Secondary | ICD-10-CM | POA: Diagnosis not present

## 2020-06-06 DIAGNOSIS — I10 Essential (primary) hypertension: Secondary | ICD-10-CM | POA: Diagnosis not present

## 2020-06-06 DIAGNOSIS — G4700 Insomnia, unspecified: Secondary | ICD-10-CM | POA: Diagnosis not present

## 2020-06-06 DIAGNOSIS — E039 Hypothyroidism, unspecified: Secondary | ICD-10-CM | POA: Diagnosis not present

## 2020-06-13 DIAGNOSIS — E785 Hyperlipidemia, unspecified: Secondary | ICD-10-CM | POA: Diagnosis not present

## 2020-06-13 DIAGNOSIS — E039 Hypothyroidism, unspecified: Secondary | ICD-10-CM | POA: Diagnosis not present

## 2020-06-13 DIAGNOSIS — I10 Essential (primary) hypertension: Secondary | ICD-10-CM | POA: Diagnosis not present

## 2020-06-13 DIAGNOSIS — E340 Carcinoid syndrome: Secondary | ICD-10-CM | POA: Diagnosis not present

## 2020-07-08 ENCOUNTER — Other Ambulatory Visit: Payer: Self-pay

## 2020-07-08 ENCOUNTER — Inpatient Hospital Stay (HOSPITAL_COMMUNITY): Payer: Medicare Other | Attending: Hematology

## 2020-07-08 DIAGNOSIS — D3A019 Benign carcinoid tumor of the small intestine, unspecified portion: Secondary | ICD-10-CM

## 2020-07-08 DIAGNOSIS — C7A019 Malignant carcinoid tumor of the small intestine, unspecified portion: Secondary | ICD-10-CM | POA: Diagnosis not present

## 2020-07-08 DIAGNOSIS — M25562 Pain in left knee: Secondary | ICD-10-CM | POA: Insufficient documentation

## 2020-07-08 DIAGNOSIS — Z79899 Other long term (current) drug therapy: Secondary | ICD-10-CM | POA: Diagnosis not present

## 2020-07-08 DIAGNOSIS — R197 Diarrhea, unspecified: Secondary | ICD-10-CM | POA: Insufficient documentation

## 2020-07-08 DIAGNOSIS — E785 Hyperlipidemia, unspecified: Secondary | ICD-10-CM | POA: Diagnosis not present

## 2020-07-08 DIAGNOSIS — E039 Hypothyroidism, unspecified: Secondary | ICD-10-CM | POA: Insufficient documentation

## 2020-07-08 DIAGNOSIS — F419 Anxiety disorder, unspecified: Secondary | ICD-10-CM | POA: Insufficient documentation

## 2020-07-08 DIAGNOSIS — I10 Essential (primary) hypertension: Secondary | ICD-10-CM | POA: Diagnosis not present

## 2020-07-08 LAB — CBC WITH DIFFERENTIAL/PLATELET
Abs Immature Granulocytes: 0.02 10*3/uL (ref 0.00–0.07)
Basophils Absolute: 0.1 10*3/uL (ref 0.0–0.1)
Basophils Relative: 1 %
Eosinophils Absolute: 0.2 10*3/uL (ref 0.0–0.5)
Eosinophils Relative: 3 %
HCT: 42.8 % (ref 36.0–46.0)
Hemoglobin: 14 g/dL (ref 12.0–15.0)
Immature Granulocytes: 0 %
Lymphocytes Relative: 37 %
Lymphs Abs: 3 10*3/uL (ref 0.7–4.0)
MCH: 31.5 pg (ref 26.0–34.0)
MCHC: 32.7 g/dL (ref 30.0–36.0)
MCV: 96.4 fL (ref 80.0–100.0)
Monocytes Absolute: 0.6 10*3/uL (ref 0.1–1.0)
Monocytes Relative: 7 %
Neutro Abs: 4.2 10*3/uL (ref 1.7–7.7)
Neutrophils Relative %: 52 %
Platelets: 295 10*3/uL (ref 150–400)
RBC: 4.44 MIL/uL (ref 3.87–5.11)
RDW: 13.5 % (ref 11.5–15.5)
WBC: 8 10*3/uL (ref 4.0–10.5)
nRBC: 0 % (ref 0.0–0.2)

## 2020-07-08 LAB — COMPREHENSIVE METABOLIC PANEL
ALT: 36 U/L (ref 0–44)
AST: 30 U/L (ref 15–41)
Albumin: 3.7 g/dL (ref 3.5–5.0)
Alkaline Phosphatase: 64 U/L (ref 38–126)
Anion gap: 10 (ref 5–15)
BUN: 10 mg/dL (ref 8–23)
CO2: 26 mmol/L (ref 22–32)
Calcium: 9.2 mg/dL (ref 8.9–10.3)
Chloride: 102 mmol/L (ref 98–111)
Creatinine, Ser: 0.9 mg/dL (ref 0.44–1.00)
GFR, Estimated: >60 mL/min (ref >60)
Glucose, Bld: 82 mg/dL (ref 70–99)
Potassium: 3.5 mmol/L (ref 3.5–5.1)
Sodium: 138 mmol/L (ref 135–145)
Total Bilirubin: 0.9 mg/dL (ref 0.3–1.2)
Total Protein: 6.9 g/dL (ref 6.5–8.1)

## 2020-07-09 ENCOUNTER — Encounter (HOSPITAL_COMMUNITY): Payer: Self-pay | Admitting: Radiology

## 2020-07-09 ENCOUNTER — Ambulatory Visit (HOSPITAL_COMMUNITY)
Admission: RE | Admit: 2020-07-09 | Discharge: 2020-07-09 | Disposition: A | Discharge status: Home / Self Care | Payer: Medicare Other | Source: Ambulatory Visit | Attending: Hematology | Admitting: Hematology

## 2020-07-09 ENCOUNTER — Other Ambulatory Visit: Payer: Self-pay

## 2020-07-09 ENCOUNTER — Other Ambulatory Visit (HOSPITAL_COMMUNITY): Payer: Medicare Other

## 2020-07-09 DIAGNOSIS — K439 Ventral hernia without obstruction or gangrene: Secondary | ICD-10-CM | POA: Diagnosis not present

## 2020-07-09 DIAGNOSIS — K429 Umbilical hernia without obstruction or gangrene: Secondary | ICD-10-CM | POA: Diagnosis not present

## 2020-07-09 DIAGNOSIS — K7689 Other specified diseases of liver: Secondary | ICD-10-CM | POA: Diagnosis not present

## 2020-07-09 DIAGNOSIS — D3A019 Benign carcinoid tumor of the small intestine, unspecified portion: Secondary | ICD-10-CM | POA: Insufficient documentation

## 2020-07-09 DIAGNOSIS — Z853 Personal history of malignant neoplasm of breast: Secondary | ICD-10-CM | POA: Diagnosis not present

## 2020-07-09 MED ORDER — IOHEXOL 300 MG/ML  SOLN
100.0000 mL | Freq: Once | INTRAMUSCULAR | Status: AC | PRN
  Administered 2020-07-09: 100 mL via INTRAVENOUS

## 2020-07-11 LAB — CHROMOGRANIN A: Chromogranin A (ng/mL): 53 ng/mL (ref 0.0–101.8)

## 2020-07-11 NOTE — Progress Notes (Signed)
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 Small bowel resection on 07/24/2019. 2. Right hemi-colectomy on 10/27/2019.  NGS Results: Not done  CURRENT THERAPY: Surveillance  BRIEF ONCOLOGIC HISTORY:  Oncology History   No history exists.    CANCER STAGING: Cancer Staging Carcinoid tumor of small intestine Staging form: Small Intestine - Other Histologies, AJCC 8th Edition - Clinical stage from 08/10/2019: cT4, cN0, cM0 - Unsigned   INTERVAL HISTORY:  [name], a 70 y.o. 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 Hailey Randolph was last seen on 04/08/2020.   Today she reports that her diarrhea has worsened in the last 2 weeks.  She is having 2-4 bowel movements daily.  She is not taking Imodium.  She also reported right upper quadrant tenderness.  She takes tramadol as needed for left knee pain.  REVIEW OF SYSTEMS:  Review of Systems  Respiratory: Positive for shortness of breath.   Gastrointestinal: Positive for diarrhea.  All other systems reviewed and are negative.   PAST MEDICAL/SURGICAL HISTORY:  Past Medical History:  Diagnosis Date  . Adenomatous colon polyp   . Anxiety   . HTN (hypertension)   . Hyperlipidemia   . Hypothyroidism    Past Surgical History:  Procedure Laterality Date  . ABDOMINAL HYSTERECTOMY    . APPENDECTOMY    . BIOPSY  08/14/2016   Procedure: BIOPSY;  Surgeon: Daneil Dolin, MD;  [facility];  Service: Endoscopy;;  gastric  . BOWEL RESECTION N/A 07/24/2019   Procedure: PARTIAL SMALL BOWEL RESECTION;  Surgeon: Aviva Signs, MD;  Location: [facility];  Service: General;  Laterality: N/A;  . BREAST  EXCISIONAL BIOPSY Left 10/30/2016  . BREAST EXCISIONAL BIOPSY Right 10/30/2016  . COLONOSCOPY  April 2007   Adenomatous polyp, pedunculated, at 30 cm. Scattered left-sided diverticula  . COLONOSCOPY N/A 01/17/2016   Dr. Gala Romney: 5 mm polyp in the cecum, tubular adenoma. Scattered small and large mouth diverticula in the sigmoid colon. Next colonoscopy 5 years.  . ESOPHAGOGASTRODUODENOSCOPY N/A 08/14/2016   Procedure: ESOPHAGOGASTRODUODENOSCOPY (EGD);  Surgeon: Daneil Dolin, MD;  [facility];  Service: Endoscopy;  Laterality: N/A;  215  . hysterectomy     complete  . LAPAROTOMY N/A 07/24/2019   Procedure: EXPLORATORY LAPAROTOMY;  Surgeon: Aviva Signs, MD;  Location: [facility];  Service: General;  Laterality: N/A;  . PARTIAL COLECTOMY N/A 10/27/2019   Procedure: RIGHT HEMI-COLECTOMY;  Surgeon: Aviva Signs, MD;  Location: [facility];  Service: General;  Laterality: N/A;  . RADIOACTIVE SEED GUIDED EXCISIONAL BREAST BIOPSY Bilateral 11/02/2016   Procedure: BILATERAL RADIOACTIVE SEED GUIDED EXCISIONAL BREAST BIOPSY LEFT BREAST 2 SEEDS RIGHT BREAST 1 SEED;  Surgeon: Rolm Bookbinder, MD;  Location: [facility];  Service: General;  Laterality: Bilateral;  . TONSILLECTOMY      SOCIAL HISTORY:  Social History   Socioeconomic History  . Marital status: Widowed    Spouse name: Not on file  . Number of children: 2  . Years of education: Not on file  . Highest education level: Not on file  Occupational History  . Occupation: retired    Comment: Research officer, trade union division  Tobacco Use  . Smoking  status: Never Smoker  . Smokeless tobacco: Never Used  Vaping Use  . Vaping Use: Never used  Substance and Sexual Activity  . Alcohol use: No  . Drug use: No  . Sexual activity: Not Currently  Other Topics Concern  . Not on file  Social History Narrative  . Not on file   Social Determinants of Health   Financial Resource Strain: Low Risk   . Difficulty of Paying Living Expenses:  Not hard at all  Food Insecurity: No Food Insecurity  . Worried About Programme researcher, broadcasting/film/video in the Last Year: Never true  . Ran Out of Food in the Last Year: Never true  Transportation Needs: No Transportation Needs  . Lack of Transportation (Medical): No  . Lack of Transportation (Non-Medical): No  Physical Activity: Insufficiently Active  . Days of Exercise per Week: 3 days  . Minutes of Exercise per Session: 30 min  Stress: No Stress Concern Present  . Feeling of Stress : Only a little  Social Connections: Moderately Isolated  . Frequency of Communication with Friends and Family: More than three times a week  . Frequency of Social Gatherings with Friends and Family: More than three times a week  . Attends Religious Services: More than 4 times per year  . Active Member of Clubs or Organizations: No  . Attends Banker Meetings: Never  . Marital Status: Widowed  Intimate Partner Violence: Not At Risk  . Fear of Current or Ex-Partner: No  . Emotionally Abused: No  . Physically Abused: No  . Sexually Abused: No    FAMILY HISTORY:  Family History  Problem Relation Age of Onset  . Colon polyps Mother   . Breast cancer Mother 95  . Heart attack Father 19       deceased  . Arthritis Sister   . Liver disease Neg Hx     CURRENT MEDICATIONS:  Current Outpatient Medications  Medication Sig Dispense Refill  . acetaminophen (TYLENOL) 650 MG CR tablet Take 650-1,300 mg by mouth every 8 (eight) hours as needed for pain.     Marland Kitchen atorvastatin (LIPITOR) 20 MG tablet Take 20 mg by mouth at bedtime.     . hydrochlorothiazide (HYDRODIURIL) 25 MG tablet Take 25 mg by mouth daily.    Marland Kitchen levothyroxine (SYNTHROID, LEVOTHROID) 75 MCG tablet Take 75 mcg by mouth daily before breakfast.     . LORazepam (ATIVAN) 1 MG tablet Take 1 mg by mouth daily as needed for anxiety.     Marland Kitchen losartan (COZAAR) 100 MG tablet Take 100 mg by mouth daily.    . prochlorperazine (COMPAZINE) 10 MG tablet Take 1  tablet (10 mg total) by mouth every 6 (six) hours as needed for nausea or vomiting. 30 tablet 0  . traMADol (ULTRAM) 50 MG tablet Take 50 mg by mouth every 6 (six) hours as needed. (Patient not taking: Reported on 04/08/2020)    . zolpidem (AMBIEN) 10 MG tablet Take 5 mg by mouth at bedtime as needed for sleep.  (Patient not taking: Reported on 04/08/2020)     No current facility-administered medications for this visit.    ALLERGIES:  No Known Allergies  PHYSICAL EXAM:  Performance status (ECOG): 1 - Symptomatic but completely ambulatory  There were no vitals filed for this visit. Wt Readings from Last 3 Encounters:  04/08/20 169 lb 1.6 oz (76.7 kg)  11/28/19 163 lb 12.8 oz (74.3 kg)  11/07/19 162 lb (73.5 kg)   Physical Exam  Vitals reviewed.  Constitutional:      Appearance: Normal appearance.  Cardiovascular:     Rate and Rhythm: Normal rate and regular rhythm.     Heart sounds: Normal heart sounds.  Pulmonary:     Breath sounds: Normal breath sounds.  Abdominal:     General: There is no distension.     Palpations: Abdomen is soft. There is no mass.  Musculoskeletal:        General: No swelling.  Skin:    General: Skin is warm.  Neurological:     General: No focal deficit present.     Mental Status: She is alert and oriented to person, place, and time.  Psychiatric:        Mood and Affect: Mood normal.        Behavior: Behavior normal.      LABORATORY DATA:  I have reviewed the labs as listed.  CBC Latest Ref Rng & Units 07/08/2020 04/01/2020 10/30/2019  WBC 4.0 - 10.5 K/uL 8.0 9.9 7.9  Hemoglobin 12.0 - 15.0 g/dL 14.0 14.1 10.6(L)  Hematocrit 36.0 - 46.0 % 42.8 43.3 34.0(L)  Platelets 150 - 400 K/uL 295 330 222   CMP Latest Ref Rng & Units 07/08/2020 04/01/2020 10/30/2019  Glucose 70 - 99 mg/dL 82 86 87  BUN 8 - 23 mg/dL 10 10 7(L)  Creatinine 0.44 - 1.00 mg/dL 0.90 0.74 0.71  Sodium 135 - 145 mmol/L 138 137 142  Potassium 3.5 - 5.1 mmol/L 3.5 3.4(L) 3.7  Chloride  98 - 111 mmol/L 102 98 106  CO2 22 - 32 mmol/L $RemoveB'26 28 28  'mQWjmLCN$ Calcium 8.9 - 10.3 mg/dL 9.2 9.4 8.1(L)  Total Protein 6.5 - 8.1 g/dL 6.9 7.1 -  Total Bilirubin 0.3 - 1.2 mg/dL 0.9 0.7 -  Alkaline Phos 38 - 126 U/L 64 71 -  AST 15 - 41 U/L 30 22 -  ALT 0 - 44 U/L 36 28 -    DIAGNOSTIC IMAGING:  I have independently reviewed the scans and discussed with the patient. CT Abdomen Pelvis W Contrast  Result Date: 07/10/2020 CLINICAL DATA:  History of small bowel carcinoid tumor status post partial small bowel resection (07/24/2019) and right hemicolectomy (10/27/2019). History of breast cancer. Previous hysterectomy and appendectomy. Right inguinal pain. EXAM: CT ABDOMEN AND PELVIS WITH CONTRAST TECHNIQUE: Multidetector CT imaging of the abdomen and pelvis was performed using the standard protocol following bolus administration of intravenous contrast. CONTRAST:  142mL OMNIPAQUE IOHEXOL 300 MG/ML  SOLN COMPARISON:  PET-CT 09/04/2019.  Abdominopelvic CT 07/20/2019. FINDINGS: Lower chest: Interval improved aeration of the lung bases with mild residual subsegmental atelectasis or scarring. No suspicious nodularity or pleural effusion. Hepatobiliary: There is hypervascular lesion anteriorly in the right hepatic lobe (segment 5) which measures 1.0 x 0.9 cm on image 25/2. This is not clearly seen on the patient's prior studies and could reflect a hypervascular metastasis. No other focal hepatic abnormalities are identified. The gallbladder is incompletely distended. No evidence of gallstones, gallbladder wall thickening or biliary dilatation. Pancreas: Unremarkable. No pancreatic ductal dilatation or surrounding inflammatory changes. Spleen: Normal in size without focal abnormality. Adrenals/Urinary Tract: Both adrenal glands appear normal. The kidneys appear stable without suspicious findings. There is a small cortical cyst in the lower pole of the right kidney and small parapelvic cysts bilaterally. No urinary tract  calculus or hydronephrosis. The bladder appears unremarkable for its degree of distention. Stomach/Bowel: Enteric contrast was administered. Since the PET-CT, the patient has undergone right hemicolectomy. The  ileocolonic anastomosis appears patent without evidence of local recurrence. The mid transverse colon extends into a midline abdominal wall hernia. No evidence of incarceration or obstruction. No bowel lesions are seen. Vascular/Lymphatic: There are no enlarged abdominal or pelvic lymph nodes. Small celiac nodes are unchanged, not hypermetabolic on prior PET-CT. No significant vascular findings. The portal, superior mesenteric and splenic veins are patent. Reproductive: Hysterectomy.  No adnexal mass. Other: As above, supraumbilical hernia containing transverse colon. No ascites or peritoneal nodularity. Musculoskeletal: No acute or significant osseous findings. IMPRESSION: 1. 1 cm hypervascular lesion in segment 5 of the liver, not seen previously and suspicious for a hypervascular metastasis in this patient with a history of carcinoid tumor. Correlate with 5-HIAA levels. Consider further evaluation with abdominal MRI without and with contrast. 2. Interval right hemicolectomy. No evidence of local recurrence. 3. Supraumbilical hernia containing transverse colon, without evidence of incarceration or obstruction. Electronically Signed   By: Richardean Sale M.D.   On: 07/10/2020 09:29     ASSESSMENT:  1. 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 -Exploratory laparotomy and resection of small bowel on 07/24/2019. -Pathology with grade 1 well-differentiated neuroendocrine tumor, 2 cm, unifocal, mitotic index 1/10 hpf, Ki-67 1%, tumor invades into the serosal surface, margins negative, no LVI, 2 tumor deposits measuring 0.4 cm and 0.5 cm, 0/18 lymph nodes positive,  PT4PN0. -24-hour urine for 5-HIAA was negative. Serum chromogranin was 54.7. -PET scan on 09/04/2019 showed single focus of radiotracer avid nodularity along the serosal surface of the cecum measuring 8 mm consistent with well-differentiated neuroendocrine tumor metastasis. No evidence of residual tumor at the anastomosis. No evidence of liver mets. No evidence of distal disease. -Right hemicolectomy on 10/27/2019 which showed well-differentiated neuroendocrine tumor, 1 cm involving the wall of the cecum, grade 1.. 0/25 lymph nodes involved.  2. Left breast usual ductal hyperplasia: -Lumpectomy of the left breast on 11/02/2016 showingUDH, fibrocystic changes. No evidence of malignancy. -Family history of breast cancer in the mother at age 29.   PLAN:  1. Well-differentiated neuroendocrine tumor of the small bowel: -She denies any wheezing or flushing.  However her diarrhea has gotten worse in the last 2 weeks, 2-4 episodes per day. -Recommend taking Imodium as needed. -Reviewed labs which showed normal LFTs from 07/08/2020.  Chromogranin level was normal at 53. -Reviewed CTAP from 07/09/2020 which showed 1 x 0.9 cm hypervascular lesion in the right hepatic lobe which is new.  No other evidence of metastatic disease. -Recommend 24-hour urine for 5-hydroxyindoleacetic acid. -Recommend MRI of the abdomen with and without contrast. -RTC after MRI.  2. Left breast usual ductal hyperplasia: -Mammogram on 09/20/2019 was BI-RADS Category 1.  3.  Hypertension: -Continue HCTZ 25 mg and losartan 100 mg daily.   Orders placed this encounter:  No orders of the defined types were placed in this encounter.    Derek Jack, MD [place] [phone]   I, Milinda Antis, am acting as a scribe for Dr. Sanda Linger.  I, Derek Jack MD, have reviewed the above documentation for accuracy and completeness, and I agree with the above.

## 2020-07-15 ENCOUNTER — Inpatient Hospital Stay (HOSPITAL_BASED_OUTPATIENT_CLINIC_OR_DEPARTMENT_OTHER): Payer: Medicare Other | Admitting: Hematology

## 2020-07-15 ENCOUNTER — Other Ambulatory Visit: Payer: Self-pay

## 2020-07-15 VITALS — BP 126/82 | HR 90 | Temp 98.7°F | Resp 17 | Wt 170.1 lb

## 2020-07-15 DIAGNOSIS — E785 Hyperlipidemia, unspecified: Secondary | ICD-10-CM | POA: Diagnosis not present

## 2020-07-15 DIAGNOSIS — R197 Diarrhea, unspecified: Secondary | ICD-10-CM | POA: Diagnosis not present

## 2020-07-15 DIAGNOSIS — M25562 Pain in left knee: Secondary | ICD-10-CM | POA: Diagnosis not present

## 2020-07-15 DIAGNOSIS — F419 Anxiety disorder, unspecified: Secondary | ICD-10-CM | POA: Diagnosis not present

## 2020-07-15 DIAGNOSIS — D3A019 Benign carcinoid tumor of the small intestine, unspecified portion: Secondary | ICD-10-CM

## 2020-07-15 DIAGNOSIS — C7A019 Malignant carcinoid tumor of the small intestine, unspecified portion: Secondary | ICD-10-CM | POA: Diagnosis not present

## 2020-07-15 DIAGNOSIS — I10 Essential (primary) hypertension: Secondary | ICD-10-CM | POA: Diagnosis not present

## 2020-07-15 NOTE — Patient Instructions (Signed)
[facility] at [facility] Discharge Instructions  You were seen and examined today by Dr. Katragadda. Please keep follow up as scheduled.   Thank you for choosing [facility] at [facility] to provide your oncology and hematology care.  To afford each patient quality time with our provider, please arrive at least 15 minutes before your scheduled appointment time.   If you have a lab appointment with the Cancer Center please come in thru the Main Entrance and check in at the main information desk.  You need to re-schedule your appointment should you arrive 10 or more minutes late.  We strive to give you quality time with our providers, and arriving late affects you and other patients whose appointments are after yours.  Also, if you no show three or more times for appointments you may be dismissed from the clinic at the providers discretion.     Again, thank you for choosing [facility].  Our hope is that these requests will decrease the amount of time that you wait before being seen by our physicians.       _____________________________________________________________  Should you have questions after your visit to  Cancer Center, please contact our office at [phone] and follow the prompts.  Our office hours are 8:00 a.m. and 4:30 p.m. Monday - Friday.  Please note that voicemails left after 4:00 p.m. may not be returned until the following business day.  We are closed weekends and major holidays.  You do have access to a nurse 24-7, just call the main number to the clinic [phone] and do not press any options, hold on the line and a nurse will answer the phone.    For prescription refill requests, have your pharmacy contact our office and allow 72 hours.    Due to Covid, you will need to wear a mask upon entering the hospital. If you do not have a mask, a mask will be given to you at the Main Entrance upon arrival. For  doctor visits, patients may have 1 support person age 18 or older with them. For treatment visits, patients can not have anyone with them due to social distancing guidelines and our immunocompromised population.     

## 2020-07-19 DIAGNOSIS — R197 Diarrhea, unspecified: Secondary | ICD-10-CM | POA: Diagnosis not present

## 2020-07-19 DIAGNOSIS — I10 Essential (primary) hypertension: Secondary | ICD-10-CM | POA: Diagnosis not present

## 2020-07-19 DIAGNOSIS — F419 Anxiety disorder, unspecified: Secondary | ICD-10-CM | POA: Diagnosis not present

## 2020-07-19 DIAGNOSIS — C7A019 Malignant carcinoid tumor of the small intestine, unspecified portion: Secondary | ICD-10-CM | POA: Diagnosis not present

## 2020-07-19 DIAGNOSIS — M25562 Pain in left knee: Secondary | ICD-10-CM | POA: Diagnosis not present

## 2020-07-19 DIAGNOSIS — E785 Hyperlipidemia, unspecified: Secondary | ICD-10-CM | POA: Diagnosis not present

## 2020-07-30 ENCOUNTER — Ambulatory Visit (HOSPITAL_COMMUNITY)
Admission: RE | Admit: 2020-07-30 | Discharge: 2020-07-30 | Disposition: A | Discharge status: Home / Self Care | Payer: Medicare Other | Source: Ambulatory Visit | Attending: Hematology | Admitting: Hematology

## 2020-07-30 DIAGNOSIS — D3A019 Benign carcinoid tumor of the small intestine, unspecified portion: Secondary | ICD-10-CM | POA: Diagnosis not present

## 2020-07-30 DIAGNOSIS — N281 Cyst of kidney, acquired: Secondary | ICD-10-CM | POA: Diagnosis not present

## 2020-07-30 DIAGNOSIS — C7A019 Malignant carcinoid tumor of the small intestine, unspecified portion: Secondary | ICD-10-CM | POA: Diagnosis not present

## 2020-07-30 DIAGNOSIS — K7689 Other specified diseases of liver: Secondary | ICD-10-CM | POA: Diagnosis not present

## 2020-07-30 MED ORDER — GADOBUTROL 1 MMOL/ML IV SOLN
7.0000 mL | Freq: Once | INTRAVENOUS | Status: AC | PRN
  Administered 2020-07-30: 7 mL via INTRAVENOUS

## 2020-08-01 ENCOUNTER — Inpatient Hospital Stay (HOSPITAL_COMMUNITY): Payer: Medicare Other | Attending: Hematology | Admitting: Hematology

## 2020-08-01 ENCOUNTER — Other Ambulatory Visit: Payer: Self-pay

## 2020-08-01 VITALS — BP 138/73 | HR 74 | Temp 97.2°F | Resp 18 | Wt 173.8 lb

## 2020-08-01 DIAGNOSIS — C7A012 Malignant carcinoid tumor of the ileum: Secondary | ICD-10-CM

## 2020-08-01 DIAGNOSIS — E039 Hypothyroidism, unspecified: Secondary | ICD-10-CM | POA: Diagnosis not present

## 2020-08-01 DIAGNOSIS — E785 Hyperlipidemia, unspecified: Secondary | ICD-10-CM | POA: Insufficient documentation

## 2020-08-01 DIAGNOSIS — Z79899 Other long term (current) drug therapy: Secondary | ICD-10-CM | POA: Insufficient documentation

## 2020-08-01 DIAGNOSIS — C7A019 Malignant carcinoid tumor of the small intestine, unspecified portion: Secondary | ICD-10-CM | POA: Diagnosis not present

## 2020-08-01 DIAGNOSIS — I10 Essential (primary) hypertension: Secondary | ICD-10-CM | POA: Diagnosis not present

## 2020-08-01 DIAGNOSIS — Z803 Family history of malignant neoplasm of breast: Secondary | ICD-10-CM | POA: Insufficient documentation

## 2020-08-01 DIAGNOSIS — F419 Anxiety disorder, unspecified: Secondary | ICD-10-CM | POA: Insufficient documentation

## 2020-08-01 DIAGNOSIS — D3A019 Benign carcinoid tumor of the small intestine, unspecified portion: Secondary | ICD-10-CM

## 2020-08-01 NOTE — Progress Notes (Signed)
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 Small bowel resection on 07/24/2019. 2. Right hemi-colectomy on 10/27/2019.  NGS Results: Not done  CURRENT THERAPY: Surveillance  BRIEF ONCOLOGIC HISTORY:  Oncology History   No history exists.    CANCER STAGING: Cancer Staging Carcinoid tumor of small intestine Staging form: Small Intestine - Other Histologies, AJCC 8th Edition - Clinical stage from 08/10/2019: cT4, cN0, cM0 - Unsigned   INTERVAL HISTORY:  [name], a 70 y.o. female, returns for routine follow-up of her well-differentiated neuroendocrine tumor of small intestine. Hailey Randolph was last seen on 07/15/2020.   Today she reports feeling feeling well. She denies having any abdominal pain, flushing or wheezing. Her appetite is excellent.   REVIEW OF SYSTEMS:  Review of Systems  Constitutional: Positive for fatigue (75%). Negative for appetite change.  Respiratory: Positive for shortness of breath. Negative for wheezing.   Gastrointestinal: Negative for abdominal pain.  Skin: Negative for rash.  All other systems reviewed and are negative.   PAST MEDICAL/SURGICAL HISTORY:  Past Medical History:  Diagnosis Date  . Adenomatous colon polyp   . Anxiety   . HTN (hypertension)   . Hyperlipidemia   . Hypothyroidism    Past Surgical History:  Procedure Laterality Date  . ABDOMINAL HYSTERECTOMY    . APPENDECTOMY    . BIOPSY  08/14/2016   Procedure: BIOPSY;  Surgeon: Daneil Dolin, MD;  [facility];  Service: Endoscopy;;  gastric  . BOWEL RESECTION N/A 07/24/2019   Procedure: PARTIAL SMALL BOWEL RESECTION;  Surgeon: Aviva Signs, MD;  Location: [facility];  Service: General;  Laterality:  N/A;  . BREAST EXCISIONAL BIOPSY Left 10/30/2016  . BREAST EXCISIONAL BIOPSY Right 10/30/2016  . COLONOSCOPY  April 2007   Adenomatous polyp, pedunculated, at 30 cm. Scattered left-sided diverticula  . COLONOSCOPY N/A 01/17/2016   Dr. Gala Romney: 5 mm polyp in the cecum, tubular adenoma. Scattered small and large mouth diverticula in the sigmoid colon. Next colonoscopy 5 years.  . ESOPHAGOGASTRODUODENOSCOPY N/A 08/14/2016   Procedure: ESOPHAGOGASTRODUODENOSCOPY (EGD);  Surgeon: Daneil Dolin, MD;  [facility];  Service: Endoscopy;  Laterality: N/A;  215  . hysterectomy     complete  . LAPAROTOMY N/A 07/24/2019   Procedure: EXPLORATORY LAPAROTOMY;  Surgeon: Aviva Signs, MD;  Location: [facility];  Service: General;  Laterality: N/A;  . PARTIAL COLECTOMY N/A 10/27/2019   Procedure: RIGHT HEMI-COLECTOMY;  Surgeon: Aviva Signs, MD;  Location: [facility];  Service: General;  Laterality: N/A;  . RADIOACTIVE SEED GUIDED EXCISIONAL BREAST BIOPSY Bilateral 11/02/2016   Procedure: BILATERAL RADIOACTIVE SEED GUIDED EXCISIONAL BREAST BIOPSY LEFT BREAST 2 SEEDS RIGHT BREAST 1 SEED;  Surgeon: Rolm Bookbinder, MD;  Location: [facility];  Service: General;  Laterality: Bilateral;  . TONSILLECTOMY      SOCIAL HISTORY:  Social History   Socioeconomic History  . Marital status: Widowed    Spouse name: Not on file  . Number of children: 2  . Years of education: Not on file  . Highest education level: Not on file  Occupational History  . Occupation: retired    Comment: Research officer, trade union division  Tobacco Use  . Smoking status: Never Smoker  . Smokeless tobacco: Never  Used  Vaping Use  . Vaping Use: Never used  Substance and Sexual Activity  . Alcohol use: No  . Drug use: No  . Sexual activity: Not Currently  Other Topics Concern  . Not on file  Social History Narrative  . Not on file   Social Determinants of Health   Financial Resource Strain: Low Risk   . Difficulty of Paying  Living Expenses: Not hard at all  Food Insecurity: No Food Insecurity  . Worried About Charity fundraiser in the Last Year: Never true  . Ran Out of Food in the Last Year: Never true  Transportation Needs: No Transportation Needs  . Lack of Transportation (Medical): No  . Lack of Transportation (Non-Medical): No  Physical Activity: Insufficiently Active  . Days of Exercise per Week: 3 days  . Minutes of Exercise per Session: 30 min  Stress: No Stress Concern Present  . Feeling of Stress : Only a little  Social Connections: Moderately Isolated  . Frequency of Communication with Friends and Family: More than three times a week  . Frequency of Social Gatherings with Friends and Family: More than three times a week  . Attends Religious Services: More than 4 times per year  . Active Member of Clubs or Organizations: No  . Attends Archivist Meetings: Never  . Marital Status: Widowed  Intimate Partner Violence: Not At Risk  . Fear of Current or Ex-Partner: No  . Emotionally Abused: No  . Physically Abused: No  . Sexually Abused: No    FAMILY HISTORY:  Family History  Problem Relation Age of Onset  . Colon polyps Mother   . Breast cancer Mother 60  . Heart attack Father 37       deceased  . Arthritis Sister   . Liver disease Neg Hx     CURRENT MEDICATIONS:  Current Outpatient Medications  Medication Sig Dispense Refill  . acetaminophen (TYLENOL) 650 MG CR tablet Take 650-1,300 mg by mouth every 8 (eight) hours as needed for pain.     Marland Kitchen atorvastatin (LIPITOR) 20 MG tablet Take 20 mg by mouth at bedtime.    . hydrochlorothiazide (HYDRODIURIL) 25 MG tablet Take 25 mg by mouth daily.    Marland Kitchen levothyroxine (SYNTHROID, LEVOTHROID) 75 MCG tablet Take 75 mcg by mouth daily before breakfast.    . LORazepam (ATIVAN) 1 MG tablet Take 1 mg by mouth daily as needed for anxiety.     Marland Kitchen losartan (COZAAR) 100 MG tablet Take 100 mg by mouth daily.    . potassium chloride 20 MEQ/15ML  (10%) SOLN     . traMADol (ULTRAM) 50 MG tablet Take 50 mg by mouth every 6 (six) hours as needed.    . zolpidem (AMBIEN) 10 MG tablet Take 5 mg by mouth at bedtime as needed for sleep.     No current facility-administered medications for this visit.    ALLERGIES:  No Known Allergies  PHYSICAL EXAM:  Performance status (ECOG): 1 - Symptomatic but completely ambulatory  Vitals:   08/01/20 1130  BP: 138/73  Pulse: 74  Resp: 18  Temp: (!) 97.2 F (36.2 C)  SpO2: 97%   Wt Readings from Last 3 Encounters:  08/01/20 173 lb 12.8 oz (78.8 kg)  07/15/20 170 lb 2 oz (77.2 kg)  04/08/20 169 lb 1.6 oz (76.7 kg)   Physical Exam Vitals reviewed.  Constitutional:      Appearance: Normal appearance.  Neurological:     General: No  focal deficit present.     Mental Status: She is alert and oriented to person, place, and time.  Psychiatric:        Mood and Affect: Mood normal.        Behavior: Behavior normal.      LABORATORY DATA:  I have reviewed the labs as listed.  CBC Latest Ref Rng & Units 07/08/2020 04/01/2020 10/30/2019  WBC 4.0 - 10.5 K/uL 8.0 9.9 7.9  Hemoglobin 12.0 - 15.0 g/dL 14.0 14.1 10.6(L)  Hematocrit 36.0 - 46.0 % 42.8 43.3 34.0(L)  Platelets 150 - 400 K/uL 295 330 222   CMP Latest Ref Rng & Units 07/08/2020 04/01/2020 10/30/2019  Glucose 70 - 99 mg/dL 82 86 87  BUN 8 - 23 mg/dL 10 10 7(L)  Creatinine 0.44 - 1.00 mg/dL 0.90 0.74 0.71  Sodium 135 - 145 mmol/L 138 137 142  Potassium 3.5 - 5.1 mmol/L 3.5 3.4(L) 3.7  Chloride 98 - 111 mmol/L 102 98 106  CO2 22 - 32 mmol/L $RemoveB'26 28 28  'RVERUzWk$ Calcium 8.9 - 10.3 mg/dL 9.2 9.4 8.1(L)  Total Protein 6.5 - 8.1 g/dL 6.9 7.1 -  Total Bilirubin 0.3 - 1.2 mg/dL 0.9 0.7 -  Alkaline Phos 38 - 126 U/L 64 71 -  AST 15 - 41 U/L 30 22 -  ALT 0 - 44 U/L 36 28 -    DIAGNOSTIC IMAGING:  I have independently reviewed the scans and discussed with the patient. MR Abdomen W Wo Contrast  Result Date: 07/30/2020 CLINICAL DATA:  Small bowel  carcinoid tumor. Previous surgical resection. Indeterminate liver lesion on recent CT. EXAM: MRI ABDOMEN WITHOUT AND WITH CONTRAST TECHNIQUE: Multiplanar multisequence MR imaging of the abdomen was performed both before and after the administration of intravenous contrast. CONTRAST:  27mL GADAVIST GADOBUTROL 1 MMOL/ML IV SOLN COMPARISON:  CT on 07/09/2020 FINDINGS: Lower chest: No acute findings. Hepatobiliary: A subtle 8 mm homogeneous hypervascular lesion is seen in the anterior segment of the right lobe on image 42/13, which shows no evidence of contrast washout or T2 hyperintensity. This lesion is difficult to characterize due to its small size. This could represent focal nodular hyperplasia, although early liver metastasis cannot definitely be excluded. No other liver lesions identified. Gallbladder is unremarkable. No evidence of biliary ductal dilatation. Pancreas:  No mass or inflammatory changes. Spleen:  Within normal limits in size and appearance. Adrenals/Urinary Tract: No masses identified. Tiny right renal cyst noted. No evidence of hydronephrosis. Stomach/Bowel: Visualized portion unremarkable. Vascular/Lymphatic: No pathologically enlarged lymph nodes identified. No abdominal aortic aneurysm. Other:  None. Musculoskeletal:  No suspicious bone lesions identified. IMPRESSION: 8 mm hypervascular lesion in the anterior segment of the right lobe is difficult to characterize due to its small size. This could represent focal nodular hyperplasia, although early liver metastasis cannot definitely be excluded. Recommend continued follow-up by MRI in 3-4 months. Electronically Signed   By: Marlaine Hind M.D.   On: 07/30/2020 16:52   CT Abdomen Pelvis W Contrast  Result Date: 07/10/2020 CLINICAL DATA:  History of small bowel carcinoid tumor status post partial small bowel resection (07/24/2019) and right hemicolectomy (10/27/2019). History of breast cancer. Previous hysterectomy and appendectomy. Right  inguinal pain. EXAM: CT ABDOMEN AND PELVIS WITH CONTRAST TECHNIQUE: Multidetector CT imaging of the abdomen and pelvis was performed using the standard protocol following bolus administration of intravenous contrast. CONTRAST:  163mL OMNIPAQUE IOHEXOL 300 MG/ML  SOLN COMPARISON:  PET-CT 09/04/2019.  Abdominopelvic CT 07/20/2019. FINDINGS: Lower chest: Interval improved  aeration of the lung bases with mild residual subsegmental atelectasis or scarring. No suspicious nodularity or pleural effusion. Hepatobiliary: There is hypervascular lesion anteriorly in the right hepatic lobe (segment 5) which measures 1.0 x 0.9 cm on image 25/2. This is not clearly seen on the patient's prior studies and could reflect a hypervascular metastasis. No other focal hepatic abnormalities are identified. The gallbladder is incompletely distended. No evidence of gallstones, gallbladder wall thickening or biliary dilatation. Pancreas: Unremarkable. No pancreatic ductal dilatation or surrounding inflammatory changes. Spleen: Normal in size without focal abnormality. Adrenals/Urinary Tract: Both adrenal glands appear normal. The kidneys appear stable without suspicious findings. There is a small cortical cyst in the lower pole of the right kidney and small parapelvic cysts bilaterally. No urinary tract calculus or hydronephrosis. The bladder appears unremarkable for its degree of distention. Stomach/Bowel: Enteric contrast was administered. Since the PET-CT, the patient has undergone right hemicolectomy. The ileocolonic anastomosis appears patent without evidence of local recurrence. The mid transverse colon extends into a midline abdominal wall hernia. No evidence of incarceration or obstruction. No bowel lesions are seen. Vascular/Lymphatic: There are no enlarged abdominal or pelvic lymph nodes. Small celiac nodes are unchanged, not hypermetabolic on prior PET-CT. No significant vascular findings. The portal, superior mesenteric and  splenic veins are patent. Reproductive: Hysterectomy.  No adnexal mass. Other: As above, supraumbilical hernia containing transverse colon. No ascites or peritoneal nodularity. Musculoskeletal: No acute or significant osseous findings. IMPRESSION: 1. 1 cm hypervascular lesion in segment 5 of the liver, not seen previously and suspicious for a hypervascular metastasis in this patient with a history of carcinoid tumor. Correlate with 5-HIAA levels. Consider further evaluation with abdominal MRI without and with contrast. 2. Interval right hemicolectomy. No evidence of local recurrence. 3. Supraumbilical hernia containing transverse colon, without evidence of incarceration or obstruction. Electronically Signed   By: Carey Bullocks M.D.   On: 07/10/2020 09:29     ASSESSMENT:  1. 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 -Exploratory laparotomy and resection of small bowel on 07/24/2019. -Pathology with grade 1 well-differentiated neuroendocrine tumor, 2 cm, unifocal, mitotic index 1/10 hpf, Ki-67 1%, tumor invades into the serosal surface, margins negative, no LVI, 2 tumor deposits measuring 0.4 cm and 0.5 cm, 0/18 lymph nodes positive, PT4PN0. -24-hour urine for 5-HIAA was negative. Serum chromogranin was 54.7. -PET scan on 09/04/2019 showed single focus of radiotracer avid nodularity along the serosal surface of the cecum measuring 8 mm consistent with well-differentiated neuroendocrine tumor metastasis. No evidence of residual tumor at the anastomosis. No evidence of liver mets. No evidence of distal disease. -Right hemicolectomy on 10/27/2019 which showed well-differentiated neuroendocrine tumor, 1 cm involving the wall of the cecum, grade 1. 0/25 lymph nodes involved.  2. Left breast usual ductal hyperplasia: -Lumpectomy of the left breast on 11/02/2016 showingUDH,  fibrocystic changes. No evidence of malignancy. -Family history of breast cancer in the mother at age 42.   PLAN:  1. 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 -Reviewed MRI of the abdomen with and without contrast from 07/30/2020. -8 mm right liver lobe lesion, too small to characterize.  Differential diagnosis includes FNH and early metastatic lesion.  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 -5-hydroxyindoleacetic acid in the urine was normal.  24-hour volume is pending. -Recommend follow-up of the liver lesion with repeat MRI and chromogranin in  4 months.  2. Left breast usual ductal hyperplasia: -Last mammogram on 09/12/2019 was BI-RADS Category 1.  3.  Hypertension: -Continue HCTZ and losartan.   Orders placed this encounter:  No orders of the defined types were placed in this encounter.    Derek Jack, MD [place] [phone]   I, Milinda Antis, am acting as a scribe for Dr. Sanda Linger.  I, Derek Jack MD, have reviewed the above documentation for accuracy and completeness, and I agree with the above.

## 2020-08-01 NOTE — Patient Instructions (Addendum)
[place] at [facility] Discharge Instructions  You were seen today by Dr. Delton Coombes. He went over your recent results and scans. You will be scheduled to have an MRI of your abdomen before your next visit. Dr. Delton Coombes will see you back in 4 months for labs and follow up.   Thank you for choosing [place] at [facility] to provide your oncology and hematology care.  To afford each patient quality time with our provider, please arrive at least 15 minutes before your scheduled appointment time.   If you have a lab appointment with the [facility] please come in thru the Main Entrance and check in at the main information desk  You need to re-schedule your appointment should you arrive 10 or more minutes late.  We strive to give you quality time with our providers, and arriving late affects you and other patients whose appointments are after yours.  Also, if you no show three or more times for appointments you may be dismissed from the clinic at the providers discretion.     Again, thank you for choosing [facility].  Our hope is that these requests will decrease the amount of time that you wait before being seen by our physicians.       _____________________________________________________________  Should you have questions after your visit to [facility], please contact our office at [phone] between the hours of 8:00 a.m. and 4:30 p.m.  Voicemails left after 4:00 p.m. will not be returned until the following business day.  For prescription refill requests, have your pharmacy contact our office and allow 72 hours.    Cancer Center Support Programs:   > Cancer Support Group  2nd Tuesday of the month 1pm-2pm, Journey Room

## 2020-08-06 ENCOUNTER — Ambulatory Visit (HOSPITAL_COMMUNITY): Payer: Medicare Other | Admitting: Hematology

## 2020-09-04 DIAGNOSIS — Z79899 Other long term (current) drug therapy: Secondary | ICD-10-CM | POA: Diagnosis not present

## 2020-09-04 DIAGNOSIS — E876 Hypokalemia: Secondary | ICD-10-CM | POA: Diagnosis not present

## 2020-09-04 DIAGNOSIS — E039 Hypothyroidism, unspecified: Secondary | ICD-10-CM | POA: Diagnosis not present

## 2020-09-23 DIAGNOSIS — H1045 Other chronic allergic conjunctivitis: Secondary | ICD-10-CM | POA: Diagnosis not present

## 2020-09-30 ENCOUNTER — Other Ambulatory Visit (HOSPITAL_COMMUNITY): Payer: Self-pay | Admitting: Internal Medicine

## 2020-09-30 DIAGNOSIS — Z1231 Encounter for screening mammogram for malignant neoplasm of breast: Secondary | ICD-10-CM

## 2020-10-02 ENCOUNTER — Ambulatory Visit (HOSPITAL_COMMUNITY)
Admission: RE | Admit: 2020-10-02 | Discharge: 2020-10-02 | Disposition: A | Discharge status: Home / Self Care | Payer: Medicare Other | Source: Ambulatory Visit | Attending: Internal Medicine | Admitting: Internal Medicine

## 2020-10-02 ENCOUNTER — Other Ambulatory Visit: Payer: Self-pay

## 2020-10-02 DIAGNOSIS — Z1231 Encounter for screening mammogram for malignant neoplasm of breast: Secondary | ICD-10-CM | POA: Insufficient documentation

## 2020-11-12 DIAGNOSIS — Z23 Encounter for immunization: Secondary | ICD-10-CM | POA: Diagnosis not present

## 2020-11-26 ENCOUNTER — Inpatient Hospital Stay (HOSPITAL_COMMUNITY): Payer: Medicare Other | Attending: Hematology

## 2020-11-26 ENCOUNTER — Other Ambulatory Visit (HOSPITAL_COMMUNITY)
Admission: RE | Admit: 2020-11-26 | Discharge: 2020-11-26 | Disposition: A | Discharge status: Home / Self Care | Payer: Medicare Other | Source: Ambulatory Visit | Attending: Internal Medicine | Admitting: Internal Medicine

## 2020-11-26 ENCOUNTER — Other Ambulatory Visit: Payer: Self-pay

## 2020-11-26 ENCOUNTER — Ambulatory Visit (HOSPITAL_COMMUNITY)
Admission: RE | Admit: 2020-11-26 | Discharge: 2020-11-26 | Disposition: A | Discharge status: Home / Self Care | Payer: Medicare Other | Source: Ambulatory Visit | Attending: Hematology | Admitting: Hematology

## 2020-11-26 DIAGNOSIS — E039 Hypothyroidism, unspecified: Secondary | ICD-10-CM | POA: Insufficient documentation

## 2020-11-26 DIAGNOSIS — C7A012 Malignant carcinoid tumor of the ileum: Secondary | ICD-10-CM

## 2020-11-26 DIAGNOSIS — Z85060 Personal history of malignant carcinoid tumor of small intestine: Secondary | ICD-10-CM | POA: Diagnosis not present

## 2020-11-26 DIAGNOSIS — K7689 Other specified diseases of liver: Secondary | ICD-10-CM | POA: Diagnosis not present

## 2020-11-26 DIAGNOSIS — K439 Ventral hernia without obstruction or gangrene: Secondary | ICD-10-CM | POA: Diagnosis not present

## 2020-11-26 DIAGNOSIS — C7A8 Other malignant neuroendocrine tumors: Secondary | ICD-10-CM | POA: Insufficient documentation

## 2020-11-26 DIAGNOSIS — N281 Cyst of kidney, acquired: Secondary | ICD-10-CM | POA: Diagnosis not present

## 2020-11-26 LAB — CBC WITH DIFFERENTIAL/PLATELET
Abs Immature Granulocytes: 0.04 10*3/uL (ref 0.00–0.07)
Basophils Absolute: 0 10*3/uL (ref 0.0–0.1)
Basophils Relative: 1 %
Eosinophils Absolute: 0.2 10*3/uL (ref 0.0–0.5)
Eosinophils Relative: 2 %
HCT: 44 % (ref 36.0–46.0)
Hemoglobin: 14.5 g/dL (ref 12.0–15.0)
Immature Granulocytes: 1 %
Lymphocytes Relative: 22 %
Lymphs Abs: 1.7 10*3/uL (ref 0.7–4.0)
MCH: 31.5 pg (ref 26.0–34.0)
MCHC: 33 g/dL (ref 30.0–36.0)
MCV: 95.4 fL (ref 80.0–100.0)
Monocytes Absolute: 0.6 10*3/uL (ref 0.1–1.0)
Monocytes Relative: 7 %
Neutro Abs: 5.5 10*3/uL (ref 1.7–7.7)
Neutrophils Relative %: 67 %
Platelets: 324 10*3/uL (ref 150–400)
RBC: 4.61 MIL/uL (ref 3.87–5.11)
RDW: 13.4 % (ref 11.5–15.5)
WBC: 8 10*3/uL (ref 4.0–10.5)
nRBC: 0 % (ref 0.0–0.2)

## 2020-11-26 LAB — COMPREHENSIVE METABOLIC PANEL
ALT: 33 U/L (ref 0–44)
AST: 26 U/L (ref 15–41)
Albumin: 3.6 g/dL (ref 3.5–5.0)
Alkaline Phosphatase: 68 U/L (ref 38–126)
Anion gap: 10 (ref 5–15)
BUN: 12 mg/dL (ref 8–23)
CO2: 27 mmol/L (ref 22–32)
Calcium: 9 mg/dL (ref 8.9–10.3)
Chloride: 105 mmol/L (ref 98–111)
Creatinine, Ser: 0.72 mg/dL (ref 0.44–1.00)
GFR, Estimated: >60 mL/min (ref >60)
Glucose, Bld: 99 mg/dL (ref 70–99)
Potassium: 3.9 mmol/L (ref 3.5–5.1)
Sodium: 142 mmol/L (ref 135–145)
Total Bilirubin: 0.8 mg/dL (ref 0.3–1.2)
Total Protein: 6.8 g/dL (ref 6.5–8.1)

## 2020-11-26 LAB — TSH: TSH: 0.07 u[IU]/mL — ABNORMAL LOW (ref 0.350–4.500)

## 2020-11-26 MED ORDER — GADOBUTROL 1 MMOL/ML IV SOLN
10.0000 mL | Freq: Once | INTRAVENOUS | Status: AC | PRN
  Administered 2020-11-26: 09:00:00 10 mL via INTRAVENOUS

## 2020-11-27 LAB — CHROMOGRANIN A: Chromogranin A (ng/mL): 64.9 ng/mL (ref 0.0–101.8)

## 2020-11-28 ENCOUNTER — Inpatient Hospital Stay (HOSPITAL_COMMUNITY): Payer: Medicare Other

## 2020-11-28 ENCOUNTER — Ambulatory Visit (HOSPITAL_COMMUNITY): Payer: Medicare Other

## 2020-12-05 ENCOUNTER — Other Ambulatory Visit: Payer: Self-pay

## 2020-12-05 ENCOUNTER — Inpatient Hospital Stay (HOSPITAL_BASED_OUTPATIENT_CLINIC_OR_DEPARTMENT_OTHER): Payer: Medicare Other | Admitting: Physician Assistant

## 2020-12-05 DIAGNOSIS — C7A012 Malignant carcinoid tumor of the ileum: Secondary | ICD-10-CM | POA: Diagnosis not present

## 2020-12-05 DIAGNOSIS — K439 Ventral hernia without obstruction or gangrene: Secondary | ICD-10-CM | POA: Diagnosis not present

## 2020-12-05 NOTE — Progress Notes (Signed)
[facility] [address], [place] [address]   VIRTUAL VISIT  CLINIC:  Medical Oncology/Hematology  PCP:  Carylon Perches, MD [address] 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 Small bowel resection on 07/24/2019. 2. Right hemi-colectomy on 10/27/2019.  NGS Results: Not done  CURRENT THERAPY: Surveillance  BRIEF ONCOLOGIC HISTORY:  Oncology History   No history exists.    CANCER STAGING: Cancer Staging Carcinoid tumor of small intestine Staging form: Small Intestine - Other Histologies, AJCC 8th Edition - Clinical stage from 08/10/2019: cT4, cN0, cM0 - Unsigned   INTERVAL HISTORY:  Hailey Randolph, a 70 y.o. female with history of well-differentiated neuroendocrine tumor of small intestine. She was last seen on 08/01/2020. Patient notes that she is doing well with stable energy and appetite. She is able to complete all her ADLs on her own. She denies any nausea or vomiting. Over the last 3-4 months, patient has noticed a hernia in the abdomen that is getting bigger. She notes the hernia is causing some discomfort and pressure. Patient has regular bowel movements without any diarrhea or constipation. She denies easy bruising or signs of bleeding. Patient denies any fevers, chills, night sweats, flushing, shortness of breath, chest pain, palpitations or cough. She has no other complaints.   REVIEW OF SYSTEMS:  Review of Systems  Constitutional:  Negative for appetite change, chills, fatigue and fever.  Respiratory:  Negative for cough, shortness of breath and wheezing.   Cardiovascular:  Negative for chest pain.  Gastrointestinal:  Negative for blood in stool, constipation, diarrhea, nausea and vomiting.       Mid abdominal hernia   Endocrine: Negative for hot flashes.  Skin:  Negative for rash.  All other systems reviewed and  are negative.  PAST MEDICAL/SURGICAL HISTORY:  Past Medical History:  Diagnosis Date   Adenomatous colon polyp    Anxiety    HTN (hypertension)    Hyperlipidemia    Hypothyroidism    Past Surgical History:  Procedure Laterality Date   ABDOMINAL HYSTERECTOMY     APPENDECTOMY     BIOPSY  08/14/2016   Procedure: BIOPSY;  Surgeon: Corbin Ade, MD;  [facility];  Service: Endoscopy;;  gastric   BOWEL RESECTION N/A 07/24/2019   Procedure: PARTIAL SMALL BOWEL RESECTION;  Surgeon: Franky Macho, MD;  Location: [facility];  Service: General;  Laterality: N/A;   BREAST EXCISIONAL BIOPSY Left 10/30/2016   BREAST EXCISIONAL BIOPSY Right 10/30/2016   COLONOSCOPY  April 2007   Adenomatous polyp, pedunculated, at 30 cm. Scattered left-sided diverticula   COLONOSCOPY N/A 01/17/2016   Dr. Jena Gauss: 5 mm polyp in the cecum, tubular adenoma. Scattered small and large mouth diverticula in the sigmoid colon. Next colonoscopy 5 years.   ESOPHAGOGASTRODUODENOSCOPY N/A 08/14/2016   Procedure: ESOPHAGOGASTRODUODENOSCOPY (EGD);  Surgeon: Corbin Ade, MD;  [facility];  Service: Endoscopy;  Laterality: N/A;  215   hysterectomy     complete   LAPAROTOMY N/A 07/24/2019   Procedure: EXPLORATORY LAPAROTOMY;  Surgeon: Franky Macho, MD;  Location: [facility];  Service: General;  Laterality: N/A;   PARTIAL COLECTOMY N/A 10/27/2019   Procedure: RIGHT HEMI-COLECTOMY;  Surgeon: Franky Macho, MD;  Location: [facility];  Service: General;  Laterality: N/A;   RADIOACTIVE SEED GUIDED EXCISIONAL BREAST BIOPSY Bilateral 11/02/2016   Procedure: BILATERAL RADIOACTIVE SEED GUIDED EXCISIONAL BREAST  BIOPSY LEFT BREAST 2 SEEDS RIGHT BREAST 1 SEED;  Surgeon: Rolm Bookbinder, MD;  Location: [facility];  Service: General;  Laterality: Bilateral;   TONSILLECTOMY      SOCIAL HISTORY:  Social History   Socioeconomic History   Marital status: Widowed    Spouse name: Not on file   Number of children: 2    Years of education: Not on file   Highest education level: Not on file  Occupational History   Occupation: retired    Comment: Research officer, trade union division  Tobacco Use   Smoking status: Never   Smokeless tobacco: Never  Vaping Use   Vaping Use: Never used  Substance and Sexual Activity   Alcohol use: No   Drug use: No   Sexual activity: Not Currently  Other Topics Concern   Not on file  Social History Narrative   Not on file   Social Determinants of Health   Financial Resource Strain: Not on file  Food Insecurity: Not on file  Transportation Needs: Not on file  Physical Activity: Not on file  Stress: Not on file  Social Connections: Not on file  Intimate Partner Violence: Not on file    FAMILY HISTORY:  Family History  Problem Relation Age of Onset   Colon polyps Mother    Breast cancer Mother 11   Heart attack Father 63       deceased   Arthritis Sister    Liver disease Neg Hx     CURRENT MEDICATIONS:  Current Outpatient Medications  Medication Sig Dispense Refill   acetaminophen (TYLENOL) 650 MG CR tablet Take 650-1,300 mg by mouth every 8 (eight) hours as needed for pain.      atorvastatin (LIPITOR) 20 MG tablet Take 20 mg by mouth at bedtime.     hydrochlorothiazide (HYDRODIURIL) 25 MG tablet Take 25 mg by mouth daily.     levothyroxine (SYNTHROID, LEVOTHROID) 75 MCG tablet Take 75 mcg by mouth daily before breakfast.     LORazepam (ATIVAN) 1 MG tablet Take 1 mg by mouth daily as needed for anxiety.      losartan (COZAAR) 100 MG tablet Take 100 mg by mouth daily.     potassium chloride 20 MEQ/15ML (10%) SOLN      traMADol (ULTRAM) 50 MG tablet Take 50 mg by mouth every 6 (six) hours as needed.     zolpidem (AMBIEN) 10 MG tablet Take 5 mg by mouth at bedtime as needed for sleep.     No current facility-administered medications for this visit.    ALLERGIES:  No Known Allergies  PHYSICAL EXAM:  There were no vitals filed for this visit.  Wt Readings from  Last 3 Encounters:  08/01/20 173 lb 12.8 oz (78.8 kg)  07/15/20 170 lb 2 oz (77.2 kg)  04/08/20 169 lb 1.6 oz (76.7 kg)   I did not complete physical exam due to virtual visit.   LABORATORY DATA:  I have reviewed the labs as listed.  CBC Latest Ref Rng & Units 11/26/2020 07/08/2020 04/01/2020  WBC 4.0 - 10.5 K/uL 8.0 8.0 9.9  Hemoglobin 12.0 - 15.0 g/dL 14.5 14.0 14.1  Hematocrit 36.0 - 46.0 % 44.0 42.8 43.3  Platelets 150 - 400 K/uL 324 295 330   CMP Latest Ref Rng & Units 11/26/2020 07/08/2020 04/01/2020  Glucose 70 - 99 mg/dL 99 82 86  BUN 8 - 23 mg/dL 12 10 10   Creatinine 0.44 - 1.00 mg/dL 0.72 0.90 0.74  Sodium 135 -  145 mmol/L 142 138 137  Potassium 3.5 - 5.1 mmol/L 3.9 3.5 3.4(L)  Chloride 98 - 111 mmol/L 105 102 98  CO2 22 - 32 mmol/L $RemoveB'27 26 28  'JdEkWLoK$ Calcium 8.9 - 10.3 mg/dL 9.0 9.2 9.4  Total Protein 6.5 - 8.1 g/dL 6.8 6.9 7.1  Total Bilirubin 0.3 - 1.2 mg/dL 0.8 0.9 0.7  Alkaline Phos 38 - 126 U/L 68 64 71  AST 15 - 41 U/L $Remo'26 30 22  'RiaDL$ ALT 0 - 44 U/L 33 36 28    DIAGNOSTIC IMAGING:  I have independently reviewed the scans and discussed with the patient. MR Abdomen W Wo Contrast  Result Date: 11/26/2020 CLINICAL DATA:  Follow-up 8 mm hepatic lesion, history of malignant carcinoid tumor of the ileum EXAM: MRI ABDOMEN WITHOUT AND WITH CONTRAST TECHNIQUE: Multiplanar multisequence MR imaging of the abdomen was performed both before and after the administration of intravenous contrast. CONTRAST:  25mL GADAVIST GADOBUTROL 1 MMOL/ML IV SOLN COMPARISON:  CT July 20, 2019, PET-CT September 04, 2019 and MRI abdomen July 30, 2020 FINDINGS: Lower chest: No acute abnormality. Hepatobiliary: No hepatic steatosis. Arterially enhancing segment V hepatic lesion measuring 8 mm on image 36/15, unchanged from prior. The lesion does not demonstrate reduced diffusivity and is isointense to background parenchyma on T1 and T2 imaging sequences. In comparison to multiple prior imaging studies, this lesion is not  significantly changed in size dating back to CT July 20, 2019 in which it was subtly evident (possibly due to contrast timing) and measured 8 mm. Additionally, this lesion did not demonstrate radiotracer avidity on Dotatate imaging. Increased conspicuity of an 8 mm focus of arterial enhancement in segment VI on image 44/15, which demonstrates similar imaging characteristics. Additionally, in retrospect this lesion was also subtly evident and unchanged in size dating back to CT July 20, 2019 and was not radiotracer avid on prior Dotatate imaging. Gallbladder is unremarkable.  No biliary ductal dilation. Pancreas:  Within normal limits. Spleen:  Within normal limits. Adrenals/Urinary Tract: Bilateral adrenal glands are unremarkable. Right renal cyst. No solid enhancing renal lesions. No hydronephrosis. Stomach/Bowel: Visualized portions within the abdomen are unremarkable. Vascular/Lymphatic: No pathologically enlarged lymph nodes identified. No abdominal aortic aneurysm demonstrated. Other: Small fat and nonobstructed small bowel containing ventral hernia. Musculoskeletal: No suspicious bone lesions identified. IMPRESSION: Arterially enhancing subcentimeter foci in segment V and VI, which have been stable in size dating back to July 20, 2019 and were not Dotatate avid on prior PET-CT. These are favored a benign etiology such as vascular shunts or focal nodular hyperplasia. However, given history of carcinoid tumor an indolent hepatic metastases is not entirely excluded. Continued follow-up in 6 months with liver protocol MRI with and without EOVIST contrast agent is suggested. Electronically Signed   By: Dahlia Bailiff MD   On: 11/26/2020 12:10      ASSESSMENT:  1.  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 -Exploratory laparotomy and resection of small bowel on  07/24/2019. -Pathology with grade 1 well-differentiated neuroendocrine tumor, 2 cm, unifocal, mitotic index 1/10 hpf, Ki-67 1%, tumor invades into the serosal surface, margins negative, no LVI, 2 tumor deposits measuring 0.4 cm and 0.5 cm, 0/18 lymph nodes positive, PT4PN0. -24-hour urine for 5-HIAA was negative.  Serum chromogranin was 54.7. -PET scan on 09/04/2019 showed single focus of radiotracer avid nodularity along the serosal surface of the cecum measuring 8 mm consistent  with well-differentiated neuroendocrine tumor metastasis.  No evidence of residual tumor at the anastomosis.  No evidence of liver mets.  No evidence of distal disease. -Right hemicolectomy on 10/27/2019 which showed well-differentiated neuroendocrine tumor, 1 cm involving the wall of the cecum, grade 1.  0/25 lymph nodes involved.   2.  Left breast usual ductal hyperplasia: -Lumpectomy of the left breast on 11/02/2016 showing UDH, fibrocystic changes.  No evidence of malignancy. -Family history of breast cancer in the mother at age 51.   PLAN:  1.  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 -Reviewed MRI of the abdomen with and without contrast from 11/26/2020.Findings revealed stable arterially enhancing subcentimeter foci in segment V and VI.   Likely benign etiology but with history of carcinoid tumor, recommend to continued monitoring.  -Labs from 11/26/2020 were reviewed, all were unremarkable including Chromogranin levels. --Return in 6 months with repeat labs, MR abdomen to follow liver lesions and office visit.    2.  Left breast usual ductal hyperplasia: -Last mammogram on 10/02/2020 was BI-RADS Category 1.   3.  Hypertension: -Continue HCTZ and losartan.  4. Ventral Hernia: -There is evidence of small fat and nonobstructive small bowel containing ventral hernia.  -I will reach out to patient's surgeon, Dr. Arnoldo Morale to request a follow  up and provide recommendations.    Orders placed this encounter:  Orders Placed This Encounter  Procedures   MR LIVER W WO CONTRAST   CBC with Differential   Comprehensive metabolic panel   Chromogranin A   5 HIAA, quantitative, urine, 24 hour     Patient expressed understanding and satisfaction with the plan provided.   I have spent a total of 25 minutes minutes of non-face-to-face time, preparing to see the patient, obtaining and/or reviewing separately obtained history, counseling and educating the patient, ordering tests, referring and communicating with other health care professionals, documenting clinical information in the electronic health record, and care coordination.    Lincoln Brigham PA-C Hematology and [place]

## 2020-12-10 DIAGNOSIS — H40013 Open angle with borderline findings, low risk, bilateral: Secondary | ICD-10-CM | POA: Diagnosis not present

## 2020-12-16 ENCOUNTER — Encounter: Payer: Self-pay | Admitting: *Deleted

## 2020-12-26 ENCOUNTER — Encounter: Payer: Self-pay | Admitting: General Surgery

## 2020-12-26 ENCOUNTER — Ambulatory Visit (INDEPENDENT_AMBULATORY_CARE_PROVIDER_SITE_OTHER): Payer: Medicare Other | Admitting: General Surgery

## 2020-12-26 ENCOUNTER — Other Ambulatory Visit: Payer: Self-pay

## 2020-12-26 VITALS — BP 128/80 | HR 66 | Temp 98.0°F | Resp 14 | Ht 63.0 in | Wt 176.0 lb

## 2020-12-26 DIAGNOSIS — K432 Incisional hernia without obstruction or gangrene: Secondary | ICD-10-CM

## 2020-12-27 NOTE — Progress Notes (Signed)
Hailey Randolph; [id number]; Aug 15, 1950   HPI Patient is a 70 year old white female status post a partial small bowel resection and right hemicolectomy in 2021 for a neuroendocrine tumor.  She states overall she is doing well.  She has developed swelling along the superior aspect of her midline incision just above her umbilicus.  It is made worse with straining or coughing.  Minimal pain is present when it starts to stick out, but it reduces when lying down. Past Medical History:  Diagnosis Date   Adenomatous colon polyp    Anxiety    HTN (hypertension)    Hyperlipidemia    Hypothyroidism     Past Surgical History:  Procedure Laterality Date   ABDOMINAL HYSTERECTOMY     APPENDECTOMY     BIOPSY  08/14/2016   Procedure: BIOPSY;  Surgeon: Daneil Dolin, MD;  [facility];  Service: Endoscopy;;  gastric   BOWEL RESECTION N/A 07/24/2019   Procedure: PARTIAL SMALL BOWEL RESECTION;  Surgeon: Aviva Signs, MD;  Location[facility];  Service: General;  Laterality: N/A;   BREAST EXCISIONAL BIOPSY Left 10/30/2016   BREAST EXCISIONAL BIOPSY Right 10/30/2016   COLONOSCOPY  April 2007   Adenomatous polyp, pedunculated, at 30 cm. Scattered left-sided diverticula   COLONOSCOPY N/A 01/17/2016   Dr. Gala Romney: 5 mm polyp in the cecum, tubular adenoma. Scattered small and large mouth diverticula in the sigmoid colon. Next colonoscopy 5 years.   ESOPHAGOGASTRODUODENOSCOPY N/A 08/14/2016   Procedure: ESOPHAGOGASTRODUODENOSCOPY (EGD);  Surgeon: Daneil Dolin, MD;  [facility];  Service: Endoscopy;  Laterality: N/A;  215   hysterectomy     complete   LAPAROTOMY N/A 07/24/2019   Procedure: EXPLORATORY LAPAROTOMY;  Surgeon: Aviva Signs, MD;  [facility];  Service: General;  Laterality: N/A;   PARTIAL COLECTOMY N/A 10/27/2019   Procedure: RIGHT HEMI-COLECTOMY;  Surgeon: Aviva Signs, MD;  [facility];  Service: General;  Laterality: N/A;   RADIOACTIVE SEED GUIDED EXCISIONAL BREAST  BIOPSY Bilateral 11/02/2016   Procedure: BILATERAL RADIOACTIVE SEED GUIDED EXCISIONAL BREAST BIOPSY LEFT BREAST 2 SEEDS RIGHT BREAST 1 SEED;  Surgeon: Rolm Bookbinder, MD;  Location: [facility];  Service: General;  Laterality: Bilateral;   TONSILLECTOMY      Family History  Problem Relation Age of Onset   Colon polyps Mother    Breast cancer Mother 55   Heart attack Father 63       deceased   Arthritis Sister    Liver disease Neg Hx     Current Outpatient Medications on File Prior to Visit  Medication Sig Dispense Refill   acetaminophen (TYLENOL) 650 MG CR tablet Take 650-1,300 mg by mouth every 8 (eight) hours as needed for pain.      atorvastatin (LIPITOR) 20 MG tablet Take 20 mg by mouth at bedtime.     hydrochlorothiazide (HYDRODIURIL) 25 MG tablet Take 25 mg by mouth daily.     levothyroxine (SYNTHROID, LEVOTHROID) 75 MCG tablet Take 75 mcg by mouth daily before breakfast.     LORazepam (ATIVAN) 1 MG tablet Take 1 mg by mouth daily as needed for anxiety.      losartan (COZAAR) 100 MG tablet Take 100 mg by mouth daily.     potassium chloride 20 MEQ/15ML (10%) SOLN      traMADol (ULTRAM) 50 MG tablet Take 50 mg by mouth every 6 (six) hours as needed.     zolpidem (AMBIEN) 10 MG tablet Take 5 mg by mouth  at bedtime as needed for sleep.     No current facility-administered medications on file prior to visit.    No Known Allergies  Social History   Substance and Sexual Activity  Alcohol Use No    Social History   Tobacco Use  Smoking Status Never  Smokeless Tobacco Never    Review of Systems  Constitutional: Negative.   HENT: Negative.    Eyes: Negative.   Respiratory: Negative.    Cardiovascular: Negative.   Gastrointestinal: Negative.   Genitourinary: Negative.   Musculoskeletal: Negative.   Skin: Negative.   Neurological: Negative.   Endo/Heme/Allergies: Negative.   Psychiatric/Behavioral: Negative.     Objective   Vitals:   12/26/20  1011  BP: 128/80  Pulse: 66  Resp: 14  Temp: 98 F (36.7 C)  SpO2: 95%    Physical Exam Vitals reviewed.  Constitutional:      Appearance: Normal appearance. She is not ill-appearing.  HENT:     Head: Normocephalic and atraumatic.  Cardiovascular:     Rate and Rhythm: Normal rate and regular rhythm.     Heart sounds: Normal heart sounds. No murmur heard.   No gallop.  Pulmonary:     Effort: Pulmonary effort is normal. No respiratory distress.     Breath sounds: Normal breath sounds. No stridor. No wheezing, rhonchi or rales.  Abdominal:     General: Bowel sounds are normal. There is no distension.     Palpations: Abdomen is soft. There is no mass.     Tenderness: There is no guarding or rebound.     Hernia: A hernia is present.     Comments: Patient does have an incisional hernia which is easily reducible along the superior aspect of her midline incision just above the umbilicus.  It measures approximately 4 to 5 cm in its greatest diameter.  Skin:    General: Skin is warm and dry.  Neurological:     Mental Status: She is alert and oriented to person, place, and time.   MRI of the abdomen images personally reviewed Assessment  Incisional hernia, currently for the most part asymptomatic.  No evidence of incarceration. Plan  The patient would like to wait on any surgical intervention to repair the incisional hernia unless it is absolutely necessary.  The risk of incarceration is low.  Should she become more symptomatic, I told her to return to my office and we would schedule an incisional herniorrhaphy with mesh, possibly laparoscopic.

## 2021-02-18 DIAGNOSIS — Z79899 Other long term (current) drug therapy: Secondary | ICD-10-CM | POA: Diagnosis not present

## 2021-02-18 DIAGNOSIS — E039 Hypothyroidism, unspecified: Secondary | ICD-10-CM | POA: Diagnosis not present

## 2021-02-19 DIAGNOSIS — Z23 Encounter for immunization: Secondary | ICD-10-CM | POA: Diagnosis not present

## 2021-02-28 DIAGNOSIS — E039 Hypothyroidism, unspecified: Secondary | ICD-10-CM | POA: Diagnosis not present

## 2021-02-28 DIAGNOSIS — I10 Essential (primary) hypertension: Secondary | ICD-10-CM | POA: Diagnosis not present

## 2021-03-04 DIAGNOSIS — Z23 Encounter for immunization: Secondary | ICD-10-CM | POA: Diagnosis not present

## 2021-03-04 DIAGNOSIS — H40013 Open angle with borderline findings, low risk, bilateral: Secondary | ICD-10-CM | POA: Diagnosis not present

## 2021-04-08 DIAGNOSIS — H40013 Open angle with borderline findings, low risk, bilateral: Secondary | ICD-10-CM | POA: Diagnosis not present

## 2021-06-09 ENCOUNTER — Other Ambulatory Visit: Payer: Self-pay

## 2021-06-09 ENCOUNTER — Inpatient Hospital Stay (HOSPITAL_COMMUNITY): Payer: Medicare Other | Attending: Hematology

## 2021-06-09 ENCOUNTER — Other Ambulatory Visit (HOSPITAL_COMMUNITY): Payer: Self-pay

## 2021-06-09 ENCOUNTER — Ambulatory Visit (HOSPITAL_COMMUNITY)
Admission: RE | Admit: 2021-06-09 | Discharge: 2021-06-09 | Disposition: A | Discharge status: Home / Self Care | Payer: Medicare Other | Source: Ambulatory Visit | Attending: Physician Assistant | Admitting: Physician Assistant

## 2021-06-09 DIAGNOSIS — K7689 Other specified diseases of liver: Secondary | ICD-10-CM | POA: Diagnosis not present

## 2021-06-09 DIAGNOSIS — C7A012 Malignant carcinoid tumor of the ileum: Secondary | ICD-10-CM | POA: Diagnosis not present

## 2021-06-09 DIAGNOSIS — I700 Atherosclerosis of aorta: Secondary | ICD-10-CM | POA: Diagnosis not present

## 2021-06-09 DIAGNOSIS — N281 Cyst of kidney, acquired: Secondary | ICD-10-CM | POA: Diagnosis not present

## 2021-06-09 DIAGNOSIS — K439 Ventral hernia without obstruction or gangrene: Secondary | ICD-10-CM | POA: Diagnosis not present

## 2021-06-09 LAB — COMPREHENSIVE METABOLIC PANEL
ALT: 23 U/L (ref 0–44)
AST: 21 U/L (ref 15–41)
Albumin: 3.8 g/dL (ref 3.5–5.0)
Alkaline Phosphatase: 65 U/L (ref 38–126)
Anion gap: 6 (ref 5–15)
BUN: 12 mg/dL (ref 8–23)
CO2: 27 mmol/L (ref 22–32)
Calcium: 9 mg/dL (ref 8.9–10.3)
Chloride: 106 mmol/L (ref 98–111)
Creatinine, Ser: 0.93 mg/dL (ref 0.44–1.00)
GFR, Estimated: >60 mL/min (ref >60)
Glucose, Bld: 96 mg/dL (ref 70–99)
Potassium: 3.4 mmol/L — ABNORMAL LOW (ref 3.5–5.1)
Sodium: 139 mmol/L (ref 135–145)
Total Bilirubin: 0.9 mg/dL (ref 0.3–1.2)
Total Protein: 6.8 g/dL (ref 6.5–8.1)

## 2021-06-09 LAB — CBC WITH DIFFERENTIAL/PLATELET
Abs Immature Granulocytes: 0.04 10*3/uL (ref 0.00–0.07)
Basophils Absolute: 0.1 10*3/uL (ref 0.0–0.1)
Basophils Relative: 1 %
Eosinophils Absolute: 0.2 10*3/uL (ref 0.0–0.5)
Eosinophils Relative: 2 %
HCT: 42.7 % (ref 36.0–46.0)
Hemoglobin: 14.1 g/dL (ref 12.0–15.0)
Immature Granulocytes: 0 %
Lymphocytes Relative: 27 %
Lymphs Abs: 2.6 10*3/uL (ref 0.7–4.0)
MCH: 32.5 pg (ref 26.0–34.0)
MCHC: 33 g/dL (ref 30.0–36.0)
MCV: 98.4 fL (ref 80.0–100.0)
Monocytes Absolute: 0.6 10*3/uL (ref 0.1–1.0)
Monocytes Relative: 7 %
Neutro Abs: 6.1 10*3/uL (ref 1.7–7.7)
Neutrophils Relative %: 63 %
Platelets: 283 10*3/uL (ref 150–400)
RBC: 4.34 MIL/uL (ref 3.87–5.11)
RDW: 13.2 % (ref 11.5–15.5)
WBC: 9.6 10*3/uL (ref 4.0–10.5)
nRBC: 0 % (ref 0.0–0.2)

## 2021-06-09 MED ORDER — GADOBUTROL 1 MMOL/ML IV SOLN
7.0000 mL | Freq: Once | INTRAVENOUS | Status: AC | PRN
  Administered 2021-06-09: 7 mL via INTRAVENOUS

## 2021-06-11 LAB — 5 HIAA, QUANTITATIVE, URINE, 24 HOUR
5-HIAA, Ur: 2.7 mg/L
5-HIAA,Quant.,24 Hr Urine: 2.8 mg/24 hr (ref 0.0–14.9)
Total Volume: 1050

## 2021-06-12 LAB — CHROMOGRANIN A: Chromogranin A (ng/mL): 69.6 ng/mL (ref 0.0–101.8)

## 2021-06-16 ENCOUNTER — Inpatient Hospital Stay (HOSPITAL_BASED_OUTPATIENT_CLINIC_OR_DEPARTMENT_OTHER): Payer: Medicare Other | Admitting: Hematology

## 2021-06-16 ENCOUNTER — Encounter (HOSPITAL_COMMUNITY): Payer: Self-pay

## 2021-06-16 ENCOUNTER — Inpatient Hospital Stay (HOSPITAL_COMMUNITY): Payer: Medicare Other | Admitting: Hematology

## 2021-06-16 DIAGNOSIS — C7A012 Malignant carcinoid tumor of the ileum: Secondary | ICD-10-CM

## 2021-06-16 NOTE — Progress Notes (Signed)
Virtual Visit via Telephone Note  I connected with Hailey Randolph on 06/16/21 at  4:00 PM EST by telephone and verified that I am speaking with the correct person using two identifiers.  Location: Patient: At home Provider: In the office   I discussed the limitations, risks, security and privacy concerns of performing an evaluation and management service by telephone and the availability of in person appointments. I also discussed with the patient that there may be a patient responsible charge related to this service. The patient expressed understanding and agreed to proceed.   History of Present Illness: She is seen in our clinic for well-differentiated neuroendocrine tumor of the small bowel and left breast usual ductal hyperplasia.   Observations/Objective: She reports that she has on and off diarrhea depending on what she eats.  She gets diarrhea with lettuce and slow.  She reportedly had diarrhea yesterday and today, 5 and 4 episodes respectively.  Assessment and Plan:  1.  Well-differentiated neuroendocrine tumor of the small bowel: - We reviewed her labs which showed normal chromogranin 5-hydroxyindoleacetic acid. - We have also reviewed MRI of the liver which showed stable segment 5 and 6 lesions measuring about 8 mm, most likely benign. - She does not have any signs or symptoms of carcinoid syndrome.  She has some diarrhea but dependent on what she eats. - Recommend follow-up in 6 months with repeat blood work.  We will consider repeating imaging in a year.   Follow Up Instructions: RTC 6 months for follow-up.   I discussed the assessment and treatment plan with the patient. The patient was provided an opportunity to ask questions and all were answered. The patient agreed with the plan and demonstrated an understanding of the instructions.   The patient was advised to call back or seek an in-person evaluation if the symptoms worsen or if the condition fails to improve as  anticipated.  I provided 22 minutes of non-face-to-face time during this encounter.   Derek Jack, MD

## 2021-07-14 DIAGNOSIS — Z79899 Other long term (current) drug therapy: Secondary | ICD-10-CM | POA: Diagnosis not present

## 2021-07-14 DIAGNOSIS — E039 Hypothyroidism, unspecified: Secondary | ICD-10-CM | POA: Diagnosis not present

## 2021-07-14 DIAGNOSIS — G4700 Insomnia, unspecified: Secondary | ICD-10-CM | POA: Diagnosis not present

## 2021-07-14 DIAGNOSIS — E876 Hypokalemia: Secondary | ICD-10-CM | POA: Diagnosis not present

## 2021-07-14 DIAGNOSIS — I10 Essential (primary) hypertension: Secondary | ICD-10-CM | POA: Diagnosis not present

## 2021-07-14 DIAGNOSIS — C7A019 Malignant carcinoid tumor of the small intestine, unspecified portion: Secondary | ICD-10-CM | POA: Diagnosis not present

## 2021-07-14 DIAGNOSIS — E785 Hyperlipidemia, unspecified: Secondary | ICD-10-CM | POA: Diagnosis not present

## 2021-07-21 DIAGNOSIS — E039 Hypothyroidism, unspecified: Secondary | ICD-10-CM | POA: Diagnosis not present

## 2021-07-21 DIAGNOSIS — I10 Essential (primary) hypertension: Secondary | ICD-10-CM | POA: Diagnosis not present

## 2021-07-21 DIAGNOSIS — C7A8 Other malignant neuroendocrine tumors: Secondary | ICD-10-CM | POA: Diagnosis not present

## 2021-07-21 DIAGNOSIS — R079 Chest pain, unspecified: Secondary | ICD-10-CM | POA: Diagnosis not present

## 2021-07-21 DIAGNOSIS — E785 Hyperlipidemia, unspecified: Secondary | ICD-10-CM | POA: Diagnosis not present

## 2021-07-21 DIAGNOSIS — Z6834 Body mass index (BMI) 34.0-34.9, adult: Secondary | ICD-10-CM | POA: Diagnosis not present

## 2021-08-18 ENCOUNTER — Telehealth: Payer: Self-pay | Admitting: *Deleted

## 2021-08-18 ENCOUNTER — Encounter: Payer: Self-pay | Admitting: *Deleted

## 2021-08-18 NOTE — Telephone Encounter (Signed)
Patient needs another questionaire sent to her  ?

## 2021-08-18 NOTE — Telephone Encounter (Signed)
Reviewed pt's chart.  She can be scheduled for nurse visit by phone. ?

## 2021-08-19 ENCOUNTER — Encounter: Payer: Self-pay | Admitting: Internal Medicine

## 2021-08-19 ENCOUNTER — Other Ambulatory Visit (HOSPITAL_COMMUNITY): Payer: Self-pay | Admitting: Internal Medicine

## 2021-08-19 DIAGNOSIS — Z1231 Encounter for screening mammogram for malignant neoplasm of breast: Secondary | ICD-10-CM

## 2021-10-06 ENCOUNTER — Ambulatory Visit (HOSPITAL_COMMUNITY)
Admission: RE | Admit: 2021-10-06 | Discharge: 2021-10-06 | Disposition: A | Discharge status: Home / Self Care | Payer: Medicare Other | Source: Ambulatory Visit | Attending: Internal Medicine | Admitting: Internal Medicine

## 2021-10-06 DIAGNOSIS — Z1231 Encounter for screening mammogram for malignant neoplasm of breast: Secondary | ICD-10-CM | POA: Diagnosis not present

## 2021-10-07 ENCOUNTER — Other Ambulatory Visit (HOSPITAL_COMMUNITY): Payer: Self-pay | Admitting: Internal Medicine

## 2021-10-07 DIAGNOSIS — R928 Other abnormal and inconclusive findings on diagnostic imaging of breast: Secondary | ICD-10-CM

## 2021-10-09 ENCOUNTER — Ambulatory Visit (HOSPITAL_COMMUNITY)
Admission: RE | Admit: 2021-10-09 | Discharge: 2021-10-09 | Disposition: A | Discharge status: Home / Self Care | Payer: Medicare Other | Source: Ambulatory Visit | Attending: Internal Medicine | Admitting: Internal Medicine

## 2021-10-09 ENCOUNTER — Encounter (HOSPITAL_COMMUNITY): Payer: Self-pay

## 2021-10-09 DIAGNOSIS — R921 Mammographic calcification found on diagnostic imaging of breast: Secondary | ICD-10-CM | POA: Diagnosis not present

## 2021-10-09 DIAGNOSIS — R928 Other abnormal and inconclusive findings on diagnostic imaging of breast: Secondary | ICD-10-CM | POA: Insufficient documentation

## 2021-10-12 ENCOUNTER — Encounter: Payer: Self-pay | Admitting: Cardiology

## 2021-10-12 NOTE — Progress Notes (Signed)
Cardiology Office Note  Date: 10/13/2021   ID: Hailey, Randolph [birth year], MRN [id number]  PCP:  Asencion Noble, MD  Cardiologist:  Rozann Lesches, MD Electrophysiologist:  None   Chief Complaint  Patient presents with   Shortness of Breath    History of Present Illness: Hailey Randolph is a 71 y.o. female referred for cardiology consultation by Dr. Willey Blade for the evaluation of atypical chest pain.  We discussed her symptoms today.  She states that she has had shortness of breath with activity, worse in the last several months, NYHA class II-III.  No definite exertional chest pain.  Just feels fatigued at times.  She does have a personal history of hypertension and hyperlipidemia, both being treated medically.  She states that her father died in his 37s with heart disease.  She has not undergone any prior ischemic testing.  She also describes an intermittent and more unpredictable sense of palpitations.  In someway seems to be related to emotional stress.  She is raising 2 grandchildren.  I personally reviewed her ECG today which shows sinus rhythm, rule out old inferior infarct pattern.  I went over her medications.   Past Medical History:  Diagnosis Date   Adenomatous colon polyp    Carcinoid tumor of small intestine    Essential hypertension    Hyperlipidemia    Hypothyroidism    Insomnia    Osteoarthritis     Past Surgical History:  Procedure Laterality Date   ABDOMINAL HYSTERECTOMY     APPENDECTOMY     BIOPSY  08/14/2016   Procedure: BIOPSY;  Surgeon: Daneil Dolin, MD;  [facility];  Service: Endoscopy;;  gastric   BOWEL RESECTION N/A 07/24/2019   Procedure: PARTIAL SMALL BOWEL RESECTION;  Surgeon: Aviva Signs, MD;  Location: [facility];  Service: General;  Laterality: N/A;   BREAST EXCISIONAL BIOPSY Left 10/30/2016   BREAST EXCISIONAL BIOPSY Right 10/30/2016   COLONOSCOPY  April 2007   Adenomatous polyp, pedunculated, at 30 cm. Scattered left-sided  diverticula   COLONOSCOPY N/A 01/17/2016   Dr. Gala Romney: 5 mm polyp in the cecum, tubular adenoma. Scattered small and large mouth diverticula in the sigmoid colon. Next colonoscopy 5 years.   ESOPHAGOGASTRODUODENOSCOPY N/A 08/14/2016   Procedure: ESOPHAGOGASTRODUODENOSCOPY (EGD);  Surgeon: Daneil Dolin, MD;  [facility];  Service: Endoscopy;  Laterality: N/A;  215   hysterectomy     complete   LAPAROTOMY N/A 07/24/2019   Procedure: EXPLORATORY LAPAROTOMY;  Surgeon: Aviva Signs, MD;  Location: [facility];  Service: General;  Laterality: N/A;   PARTIAL COLECTOMY N/A 10/27/2019   Procedure: RIGHT HEMI-COLECTOMY;  Surgeon: Aviva Signs, MD;  Location: [facility];  Service: General;  Laterality: N/A;   RADIOACTIVE SEED GUIDED EXCISIONAL BREAST BIOPSY Bilateral 11/02/2016   Procedure: BILATERAL RADIOACTIVE SEED GUIDED EXCISIONAL BREAST BIOPSY LEFT BREAST 2 SEEDS RIGHT BREAST 1 SEED;  Surgeon: Rolm Bookbinder, MD;  Location: [facility];  Service: General;  Laterality: Bilateral;   TONSILLECTOMY      Current Outpatient Medications  Medication Sig Dispense Refill   acetaminophen (TYLENOL) 650 MG CR tablet Take 650-1,300 mg by mouth every 8 (eight) hours as needed for pain.     atorvastatin (LIPITOR) 20 MG tablet Take 20 mg by mouth at bedtime.     hydrochlorothiazide (HYDRODIURIL) 25 MG tablet Take 25 mg by mouth daily.     levothyroxine (SYNTHROID, LEVOTHROID) 75 MCG tablet Take 75 mcg by mouth daily before  breakfast.     LORazepam (ATIVAN) 1 MG tablet Take 1 mg by mouth daily as needed for anxiety.     losartan (COZAAR) 100 MG tablet Take 100 mg by mouth daily.     potassium chloride 20 MEQ/15ML (10%) SOLN      traMADol (ULTRAM) 50 MG tablet Take 50 mg by mouth every 6 (six) hours as needed.     zolpidem (AMBIEN) 10 MG tablet Take 5 mg by mouth at bedtime as needed for sleep.     No current facility-administered medications for this visit.   Allergies:  Patient has no known  allergies.   Social History: The patient  reports that she has never smoked. She has never used smokeless tobacco. She reports that she does not drink alcohol and does not use drugs.   Family History: The patient's family history includes Arthritis in her sister; Breast cancer (age of onset: 38) in her mother; Cancer in her mother; Colon polyps in her mother; Heart attack (age of onset: 2) in her father; Hypertension in her father.   ROS: No syncope, no orthopnea or PND.  Physical Exam: VS:  BP 136/72   Pulse 70   Ht '5\' 2"'$  (1.575 m)   Wt 182 lb 6.4 oz (82.7 kg)   SpO2 96%   BMI 33.36 kg/m , BMI Body mass index is 33.36 kg/m.  Wt Readings from Last 3 Encounters:  10/13/21 182 lb 6.4 oz (82.7 kg)  12/26/20 176 lb (79.8 kg)  08/01/20 173 lb 12.8 oz (78.8 kg)    General: Patient appears comfortable at rest. HEENT: Conjunctiva and lids normal, oropharynx clear. Neck: Supple, no elevated JVP or carotid bruits, no thyromegaly. Lungs: Clear to auscultation, nonlabored breathing at rest. Cardiac: Regular rate and rhythm, no S3 or significant systolic murmur, no pericardial rub. Abdomen: Soft, nontender, bowel sounds present. Extremities: No pitting edema, distal pulses 2+. Skin: Warm and dry. Musculoskeletal: No kyphosis. Neuropsychiatric: Alert and oriented x3, affect grossly appropriate.  ECG:  An ECG dated 07/31/2019 was personally reviewed today and demonstrated:  Sinus rhythm with nonspecific T wave changes.  Recent Labwork: 11/26/2020: TSH 0.070 06/09/2021: ALT 23; AST 21; BUN 12; Creatinine, Ser 0.93; Hemoglobin 14.1; Platelets 283; Potassium 3.4; Sodium 139  February 2023: Cholesterol 170, HDL 68, LDL 78  Other Studies Reviewed Today:  No prior cardiac testing for review today.  Assessment and Plan:  1.  Dyspnea on exertion and fatigue in a 71 year old woman with family history of premature CAD, also personal history of hypertension and hyperlipidemia.  ECG is abnormal as  discussed above.  Plan to obtain a Lexiscan Myoview for ischemic assessment and also an echocardiogram for cardiac structural evaluation.  For now continue both Lipitor and Cozaar.  2.  Essential hypertension, on Cozaar and HCTZ with follow-up by Dr. Willey Blade.  3.  Mixed hyperlipidemia on Lipitor with recent LDL 78.  4.  Intermittent sense of palpitations.  Depending on cardiac ischemic and structural testing, we may ultimately consider a heart monitor.  Medication Adjustments/Labs and Tests Ordered: Current medicines are reviewed at length with the patient today.  Concerns regarding medicines are outlined above.   Tests Ordered: Orders Placed This Encounter  Procedures   NM Myocar Multi W/Spect W/Wall Motion / EF   EKG 12-Lead   ECHOCARDIOGRAM COMPLETE    Medication Changes: No orders of the defined types were placed in this encounter.   Disposition:  Follow up  test results.  Signed, Satira Sark, MD,  [facility] 10/13/2021 9:24 AM    [facility] at [facility] [address] [place], [place] [address] Phone: [phone]; Fax: [phone]

## 2021-10-13 ENCOUNTER — Encounter: Payer: Self-pay | Admitting: Cardiology

## 2021-10-13 ENCOUNTER — Ambulatory Visit (INDEPENDENT_AMBULATORY_CARE_PROVIDER_SITE_OTHER): Payer: Medicare Other | Admitting: Cardiology

## 2021-10-13 VITALS — BP 136/72 | HR 70 | Ht 62.0 in | Wt 182.4 lb

## 2021-10-13 DIAGNOSIS — E782 Mixed hyperlipidemia: Secondary | ICD-10-CM | POA: Diagnosis not present

## 2021-10-13 DIAGNOSIS — R002 Palpitations: Secondary | ICD-10-CM | POA: Diagnosis not present

## 2021-10-13 DIAGNOSIS — I10 Essential (primary) hypertension: Secondary | ICD-10-CM | POA: Diagnosis not present

## 2021-10-13 DIAGNOSIS — Z87898 Personal history of other specified conditions: Secondary | ICD-10-CM | POA: Diagnosis not present

## 2021-10-13 DIAGNOSIS — Z8249 Family history of ischemic heart disease and other diseases of the circulatory system: Secondary | ICD-10-CM

## 2021-10-13 DIAGNOSIS — R9431 Abnormal electrocardiogram [ECG] [EKG]: Secondary | ICD-10-CM

## 2021-10-13 DIAGNOSIS — R0609 Other forms of dyspnea: Secondary | ICD-10-CM | POA: Diagnosis not present

## 2021-10-13 NOTE — Patient Instructions (Signed)
Medication Instructions:   Your physician recommends that you continue on your current medications as directed. Please refer to the Current Medication list given to you today.   Labwork: None today  Testing/Procedures: Your physician has requested that you have an echocardiogram. Echocardiography is a painless test that uses sound waves to create images of your heart. It provides your doctor with information about the size and shape of your heart and how well your heart's chambers and valves are working. This procedure takes approximately one hour. There are no restrictions for this procedure.  Your physician has requested that you have a lexiscan myoview. For further information please visit [email or web address]. Please follow instruction sheet, as given.   Follow-Up: Call with results  Any Other Special Instructions Will Be Listed Below (If Applicable).  If you need a refill on your cardiac medications before your next appointment, please call your pharmacy.

## 2021-10-16 ENCOUNTER — Ambulatory Visit: Payer: Medicare Other

## 2021-10-23 ENCOUNTER — Ambulatory Visit (INDEPENDENT_AMBULATORY_CARE_PROVIDER_SITE_OTHER): Payer: Medicare Other | Admitting: General Surgery

## 2021-10-23 ENCOUNTER — Encounter: Payer: Self-pay | Admitting: General Surgery

## 2021-10-23 VITALS — BP 118/74 | HR 71 | Temp 98.2°F | Resp 14 | Ht 62.0 in | Wt 182.0 lb

## 2021-10-23 DIAGNOSIS — C7A012 Malignant carcinoid tumor of the ileum: Secondary | ICD-10-CM | POA: Diagnosis not present

## 2021-10-23 MED ORDER — SUTAB 1479-225-188 MG PO TABS: 1.0000 | ORAL_TABLET | Freq: Once | ORAL | 0 refills | Status: AC

## 2021-10-24 NOTE — Progress Notes (Signed)
Hailey Randolph; [id number]; July 20, 1950   HPI Patient is a 71 year old white female who was referred to my care by Dr. Willey Blade for a follow-up colonoscopy.  She is status post a right hemicolectomy with partial small bowel resection for carcinoid tumor in 2021.  She recently was seen by cardiology for work-up.  She is scheduled to undergo further cardiac testing in the near future.  She denies any abdominal pain, diarrhea, or blood in her stools.  She does not have evidence of carcinoid syndrome.  She last had a colonoscopy in 2017 for colon polyps. Past Medical History:  Diagnosis Date   Adenomatous colon polyp    Carcinoid tumor of small intestine    Essential hypertension    Hyperlipidemia    Hypothyroidism    Insomnia    Osteoarthritis     Past Surgical History:  Procedure Laterality Date   ABDOMINAL HYSTERECTOMY     APPENDECTOMY     BIOPSY  08/14/2016   Procedure: BIOPSY;  Surgeon: Daneil Dolin, MD;  [facility];  Service: Endoscopy;;  gastric   BOWEL RESECTION N/A 07/24/2019   Procedure: PARTIAL SMALL BOWEL RESECTION;  Surgeon: Aviva Signs, MD;  [facility];  Service: General;  Laterality: N/A;   BREAST EXCISIONAL BIOPSY Left 10/30/2016   BREAST EXCISIONAL BIOPSY Right 10/30/2016   COLONOSCOPY  April 2007   Adenomatous polyp, pedunculated, at 30 cm. Scattered left-sided diverticula   COLONOSCOPY N/A 01/17/2016   Dr. Gala Romney: 5 mm polyp in the cecum, tubular adenoma. Scattered small and large mouth diverticula in the sigmoid colon. Next colonoscopy 5 years.   ESOPHAGOGASTRODUODENOSCOPY N/A 08/14/2016   Procedure: ESOPHAGOGASTRODUODENOSCOPY (EGD);  Surgeon: Daneil Dolin, MD;  [facility];  Service: Endoscopy;  Laterality: N/A;  215   hysterectomy     complete   LAPAROTOMY N/A 07/24/2019   Procedure: EXPLORATORY LAPAROTOMY;  Surgeon: Aviva Signs, MD;  [facility];  Service: General;  Laterality: N/A;   PARTIAL COLECTOMY N/A 10/27/2019   Procedure:  RIGHT HEMI-COLECTOMY;  Surgeon: Aviva Signs, MD;  [facility];  Service: General;  Laterality: N/A;   RADIOACTIVE SEED GUIDED EXCISIONAL BREAST BIOPSY Bilateral 11/02/2016   Procedure: BILATERAL RADIOACTIVE SEED GUIDED EXCISIONAL BREAST BIOPSY LEFT BREAST 2 SEEDS RIGHT BREAST 1 SEED;  Surgeon: Rolm Bookbinder, MD;  Location: [facility];  Service: General;  Laterality: Bilateral;   TONSILLECTOMY      Family History  Problem Relation Age of Onset   Cancer Mother    Breast cancer Mother 63   Colon polyps Mother    Hypertension Father    Heart attack Father 15       deceased   Arthritis Sister    Liver disease Neg Hx     Current Outpatient Medications on File Prior to Visit  Medication Sig Dispense Refill   acetaminophen (TYLENOL) 650 MG CR tablet Take 650-1,300 mg by mouth every 8 (eight) hours as needed for pain.     atorvastatin (LIPITOR) 20 MG tablet Take 20 mg by mouth at bedtime.     hydrochlorothiazide (HYDRODIURIL) 25 MG tablet Take 25 mg by mouth daily.     levothyroxine (SYNTHROID, LEVOTHROID) 75 MCG tablet Take 75 mcg by mouth daily before breakfast.     LORazepam (ATIVAN) 1 MG tablet Take 1 mg by mouth daily as needed for anxiety.     losartan (COZAAR) 100 MG tablet Take 100 mg by mouth daily.     potassium chloride 20  MEQ/15ML (10%) SOLN      traMADol (ULTRAM) 50 MG tablet Take 50 mg by mouth every 6 (six) hours as needed.     zolpidem (AMBIEN) 10 MG tablet Take 5 mg by mouth at bedtime as needed for sleep.     No current facility-administered medications on file prior to visit.    No Known Allergies  Social History   Substance and Sexual Activity  Alcohol Use No    Social History   Tobacco Use  Smoking Status Never  Smokeless Tobacco Never    Review of Systems  Constitutional: Negative.   HENT: Negative.    Eyes: Negative.   Respiratory:  Positive for shortness of breath.   Cardiovascular: Negative.   Gastrointestinal: Negative.    Genitourinary: Negative.   Musculoskeletal: Negative.   Skin: Negative.   Neurological: Negative.   Endo/Heme/Allergies: Negative.   Psychiatric/Behavioral: Negative.     Objective   Vitals:   10/23/21 0947  BP: 118/74  Pulse: 71  Resp: 14  Temp: 98.2 F (36.8 C)  SpO2: 97%    Physical Exam Vitals reviewed.  Constitutional:      Appearance: Normal appearance. She is not ill-appearing.  HENT:     Head: Normocephalic and atraumatic.  Cardiovascular:     Rate and Rhythm: Normal rate and regular rhythm.     Heart sounds: Normal heart sounds. No murmur heard.   No friction rub. No gallop.  Pulmonary:     Effort: Pulmonary effort is normal. No respiratory distress.     Breath sounds: Normal breath sounds. No stridor. No wheezing, rhonchi or rales.  Abdominal:     General: Bowel sounds are normal. There is no distension.     Palpations: Abdomen is soft. There is no mass.     Tenderness: There is no abdominal tenderness. There is no guarding or rebound.     Hernia: No hernia is present.  Skin:    General: Skin is warm and dry.  Neurological:     Mental Status: She is alert and oriented to person, place, and time.    Assessment  History of carcinoid tumor of the intestines, history of colon polyps Current work-up for shortness of breath by cardiology. Plan  Once her cardiac work-up has been completed, patient will notify me and we will do a colonoscopy.  The risks and benefits of the procedure including bleeding and perforation were fully explained to the patient, who gave informed consent.  Sutab bowel preparation has been prescribed.

## 2021-10-27 ENCOUNTER — Ambulatory Visit (HOSPITAL_COMMUNITY)
Admission: RE | Admit: 2021-10-27 | Discharge: 2021-10-27 | Disposition: A | Discharge status: Home / Self Care | Payer: Medicare Other | Source: Ambulatory Visit | Attending: Cardiology | Admitting: Cardiology

## 2021-10-27 ENCOUNTER — Ambulatory Visit (HOSPITAL_BASED_OUTPATIENT_CLINIC_OR_DEPARTMENT_OTHER)
Admission: RE | Admit: 2021-10-27 | Discharge: 2021-10-27 | Disposition: A | Discharge status: Home / Self Care | Payer: Medicare Other | Source: Ambulatory Visit | Attending: Cardiology | Admitting: Cardiology

## 2021-10-27 ENCOUNTER — Other Ambulatory Visit (HOSPITAL_COMMUNITY): Payer: Self-pay | Admitting: Cardiology

## 2021-10-27 ENCOUNTER — Encounter (HOSPITAL_BASED_OUTPATIENT_CLINIC_OR_DEPARTMENT_OTHER)
Admission: RE | Admit: 2021-10-27 | Discharge: 2021-10-27 | Disposition: A | Discharge status: Home / Self Care | Payer: Medicare Other | Source: Ambulatory Visit | Attending: Cardiology | Admitting: Cardiology

## 2021-10-27 DIAGNOSIS — R0609 Other forms of dyspnea: Secondary | ICD-10-CM | POA: Diagnosis not present

## 2021-10-27 DIAGNOSIS — R9431 Abnormal electrocardiogram [ECG] [EKG]: Secondary | ICD-10-CM

## 2021-10-27 LAB — NM MYOCAR MULTI W/SPECT W/WALL MOTION / EF
Base ST Depression (mm): 0 mm
LV dias vol: 49 mL (ref 46–106)
LV sys vol: 9 mL
Nuc Stress EF: 82 %
Peak HR: 90 {beats}/min
RATE: 0.4
Rest HR: 59 {beats}/min
Rest Nuclear Isotope Dose: 10.8 mCi
SDS: 1
SRS: 7
SSS: 8
ST Depression (mm): 0 mm
Stress Nuclear Isotope Dose: 30.2 mCi
TID: 1.14

## 2021-10-27 LAB — ECHOCARDIOGRAM COMPLETE
Area-P 1/2: 2.5 cm2
P 1/2 time: 680 msec
S' Lateral: 2.3 cm

## 2021-10-27 MED ORDER — SODIUM CHLORIDE 0.9% FLUSH: 10.0000 mL | INTRAVENOUS | Status: DC | PRN

## 2021-10-27 MED ORDER — REGADENOSON 0.4 MG/5ML IV SOLN
INTRAVENOUS | Status: AC
  Administered 2021-10-27: 0.4 mg via INTRAVENOUS
  Filled 2021-10-27: qty 5

## 2021-10-27 MED ORDER — TECHNETIUM TC 99M TETROFOSMIN IV KIT
10.0000 | PACK | Freq: Once | INTRAVENOUS | Status: AC | PRN
  Administered 2021-10-27: 10.78 via INTRAVENOUS

## 2021-10-27 MED ORDER — SODIUM CHLORIDE FLUSH 0.9 % IV SOLN
INTRAVENOUS | Status: AC
  Administered 2021-10-27: 10 mL via INTRAVENOUS
  Filled 2021-10-27: qty 10

## 2021-10-27 MED ORDER — TECHNETIUM TC 99M TETROFOSMIN IV KIT
30.0000 | PACK | Freq: Once | INTRAVENOUS | Status: AC | PRN
Start: 2021-10-27 — End: 2021-10-27
  Administered 2021-10-27: 30.2 via INTRAVENOUS

## 2021-10-27 MED ORDER — REGADENOSON 0.4 MG/5ML IV SOLN: 0.4000 mg | Freq: Once | INTRAVENOUS | Status: AC | PRN

## 2021-10-27 NOTE — Progress Notes (Addendum)
*  PRELIMINARY RESULTS* Echocardiogram 2D Echocardiogram has been performed. Patient's  nuc med I.V. stopped working and could not be used for Definity. Nurse could not obtain another I.V. Patient states she is a very difficult stick.  Samuel Germany 10/27/2021, 1:36 PM

## 2021-10-30 ENCOUNTER — Other Ambulatory Visit: Payer: Self-pay | Admitting: *Deleted

## 2021-10-30 DIAGNOSIS — Z01812 Encounter for preprocedural laboratory examination: Secondary | ICD-10-CM

## 2021-10-30 DIAGNOSIS — C7A012 Malignant carcinoid tumor of the ileum: Secondary | ICD-10-CM

## 2021-10-30 NOTE — H&P (Signed)
Hailey Randolph; [id number]; 30-Jan-1951   HPI Patient is a 71 year old white female who was referred to my care by Dr. Willey Blade for a follow-up colonoscopy.  She is status post a right hemicolectomy with partial small bowel resection for carcinoid tumor in 2021.  She recently was seen by cardiology for work-up.  She is scheduled to undergo further cardiac testing in the near future.  She denies any abdominal pain, diarrhea, or blood in her stools.  She does not have evidence of carcinoid syndrome.  She last had a colonoscopy in 2017 for colon polyps. Past Medical History:  Diagnosis Date   Adenomatous colon polyp    Carcinoid tumor of small intestine    Essential hypertension    Hyperlipidemia    Hypothyroidism    Insomnia    Osteoarthritis     Past Surgical History:  Procedure Laterality Date   ABDOMINAL HYSTERECTOMY     APPENDECTOMY     BIOPSY  08/14/2016   Procedure: BIOPSY;  Surgeon: Daneil Dolin, MD;  [facility];  Service: Endoscopy;;  gastric   BOWEL RESECTION N/A 07/24/2019   Procedure: PARTIAL SMALL BOWEL RESECTION;  Surgeon: Aviva Signs, MD;  [facility];  Service: General;  Laterality: N/A;   BREAST EXCISIONAL BIOPSY Left 10/30/2016   BREAST EXCISIONAL BIOPSY Right 10/30/2016   COLONOSCOPY  April 2007   Adenomatous polyp, pedunculated, at 30 cm. Scattered left-sided diverticula   COLONOSCOPY N/A 01/17/2016   Dr. Gala Romney: 5 mm polyp in the cecum, tubular adenoma. Scattered small and large mouth diverticula in the sigmoid colon. Next colonoscopy 5 years.   ESOPHAGOGASTRODUODENOSCOPY N/A 08/14/2016   Procedure: ESOPHAGOGASTRODUODENOSCOPY (EGD);  Surgeon: Daneil Dolin, MD;  [facility];  Service: Endoscopy;  Laterality: N/A;  215   hysterectomy     complete   LAPAROTOMY N/A 07/24/2019   Procedure: EXPLORATORY LAPAROTOMY;  Surgeon: Aviva Signs, MD;  [facility];  Service: General;  Laterality: N/A;   PARTIAL COLECTOMY N/A 10/27/2019   Procedure:  RIGHT HEMI-COLECTOMY;  Surgeon: Aviva Signs, MD;  [facility];  Service: General;  Laterality: N/A;   RADIOACTIVE SEED GUIDED EXCISIONAL BREAST BIOPSY Bilateral 11/02/2016   Procedure: BILATERAL RADIOACTIVE SEED GUIDED EXCISIONAL BREAST BIOPSY LEFT BREAST 2 SEEDS RIGHT BREAST 1 SEED;  Surgeon: Rolm Bookbinder, MD;  Location: [facility];  Service: General;  Laterality: Bilateral;   TONSILLECTOMY      Family History  Problem Relation Age of Onset   Cancer Mother    Breast cancer Mother 60   Colon polyps Mother    Hypertension Father    Heart attack Father 96       deceased   Arthritis Sister    Liver disease Neg Hx     Current Outpatient Medications on File Prior to Visit  Medication Sig Dispense Refill   acetaminophen (TYLENOL) 650 MG CR tablet Take 650-1,300 mg by mouth every 8 (eight) hours as needed for pain.     atorvastatin (LIPITOR) 20 MG tablet Take 20 mg by mouth at bedtime.     hydrochlorothiazide (HYDRODIURIL) 25 MG tablet Take 25 mg by mouth daily.     levothyroxine (SYNTHROID, LEVOTHROID) 75 MCG tablet Take 75 mcg by mouth daily before breakfast.     LORazepam (ATIVAN) 1 MG tablet Take 1 mg by mouth daily as needed for anxiety.     losartan (COZAAR) 100 MG tablet Take 100 mg by mouth daily.     potassium chloride 20  MEQ/15ML (10%) SOLN      traMADol (ULTRAM) 50 MG tablet Take 50 mg by mouth every 6 (six) hours as needed.     zolpidem (AMBIEN) 10 MG tablet Take 5 mg by mouth at bedtime as needed for sleep.     No current facility-administered medications on file prior to visit.    No Known Allergies  Social History   Substance and Sexual Activity  Alcohol Use No    Social History   Tobacco Use  Smoking Status Never  Smokeless Tobacco Never    Review of Systems  Constitutional: Negative.   HENT: Negative.    Eyes: Negative.   Respiratory:  Positive for shortness of breath.   Cardiovascular: Negative.   Gastrointestinal: Negative.    Genitourinary: Negative.   Musculoskeletal: Negative.   Skin: Negative.   Neurological: Negative.   Endo/Heme/Allergies: Negative.   Psychiatric/Behavioral: Negative.     Objective   Vitals:   10/23/21 0947  BP: 118/74  Pulse: 71  Resp: 14  Temp: 98.2 F (36.8 C)  SpO2: 97%    Physical Exam Vitals reviewed.  Constitutional:      Appearance: Normal appearance. She is not ill-appearing.  HENT:     Head: Normocephalic and atraumatic.  Cardiovascular:     Rate and Rhythm: Normal rate and regular rhythm.     Heart sounds: Normal heart sounds. No murmur heard.   No friction rub. No gallop.  Pulmonary:     Effort: Pulmonary effort is normal. No respiratory distress.     Breath sounds: Normal breath sounds. No stridor. No wheezing, rhonchi or rales.  Abdominal:     General: Bowel sounds are normal. There is no distension.     Palpations: Abdomen is soft. There is no mass.     Tenderness: There is no abdominal tenderness. There is no guarding or rebound.     Hernia: No hernia is present.  Skin:    General: Skin is warm and dry.  Neurological:     Mental Status: She is alert and oriented to person, place, and time.    Assessment  History of carcinoid tumor of the intestines, history of colon polyps Current work-up for shortness of breath by cardiology. Plan  Once her cardiac work-up has been completed, patient will notify me and we will do a colonoscopy.  The risks and benefits of the procedure including bleeding and perforation were fully explained to the patient, who gave informed consent.  Sutab bowel preparation has been prescribed.  Addendum:  Cardiac workup negative

## 2021-11-03 ENCOUNTER — Other Ambulatory Visit (HOSPITAL_COMMUNITY)
Admission: RE | Admit: 2021-11-03 | Discharge: 2021-11-03 | Disposition: A | Discharge status: Home / Self Care | Payer: Medicare Other | Source: Ambulatory Visit | Attending: General Surgery | Admitting: General Surgery

## 2021-11-03 DIAGNOSIS — Z08 Encounter for follow-up examination after completed treatment for malignant neoplasm: Secondary | ICD-10-CM | POA: Diagnosis not present

## 2021-11-03 DIAGNOSIS — E039 Hypothyroidism, unspecified: Secondary | ICD-10-CM | POA: Diagnosis not present

## 2021-11-03 DIAGNOSIS — Z86010 Personal history of colonic polyps: Secondary | ICD-10-CM | POA: Diagnosis not present

## 2021-11-03 DIAGNOSIS — Z9049 Acquired absence of other specified parts of digestive tract: Secondary | ICD-10-CM | POA: Diagnosis not present

## 2021-11-03 DIAGNOSIS — Z85030 Personal history of malignant carcinoid tumor of large intestine: Secondary | ICD-10-CM | POA: Diagnosis not present

## 2021-11-03 DIAGNOSIS — C7A019 Malignant carcinoid tumor of the small intestine, unspecified portion: Secondary | ICD-10-CM | POA: Diagnosis present

## 2021-11-03 DIAGNOSIS — I10 Essential (primary) hypertension: Secondary | ICD-10-CM | POA: Diagnosis not present

## 2021-11-03 LAB — BASIC METABOLIC PANEL
Anion gap: 6 (ref 5–15)
BUN: 12 mg/dL (ref 8–23)
CO2: 24 mmol/L (ref 22–32)
Calcium: 8.7 mg/dL — ABNORMAL LOW (ref 8.9–10.3)
Chloride: 107 mmol/L (ref 98–111)
Creatinine, Ser: 0.9 mg/dL (ref 0.44–1.00)
GFR, Estimated: >60 mL/min (ref >60)
Glucose, Bld: 92 mg/dL (ref 70–99)
Potassium: 3.1 mmol/L — ABNORMAL LOW (ref 3.5–5.1)
Sodium: 137 mmol/L (ref 135–145)

## 2021-11-04 ENCOUNTER — Encounter (HOSPITAL_COMMUNITY): Payer: Self-pay | Admitting: General Surgery

## 2021-11-04 ENCOUNTER — Ambulatory Visit (HOSPITAL_COMMUNITY): Payer: Medicare Other | Admitting: Certified Registered"

## 2021-11-04 ENCOUNTER — Ambulatory Visit (HOSPITAL_COMMUNITY)
Admission: RE | Admit: 2021-11-04 | Discharge: 2021-11-04 | Disposition: A | Discharge status: Home / Self Care | Payer: Medicare Other | Source: Ambulatory Visit | Attending: General Surgery | Admitting: General Surgery

## 2021-11-04 ENCOUNTER — Ambulatory Visit (HOSPITAL_BASED_OUTPATIENT_CLINIC_OR_DEPARTMENT_OTHER): Payer: Medicare Other | Admitting: Certified Registered"

## 2021-11-04 ENCOUNTER — Other Ambulatory Visit: Payer: Self-pay

## 2021-11-04 ENCOUNTER — Encounter (HOSPITAL_COMMUNITY)
Admission: RE | Disposition: A | Discharge status: Home / Self Care | Payer: Self-pay | Source: Ambulatory Visit | Attending: General Surgery

## 2021-11-04 DIAGNOSIS — Z9049 Acquired absence of other specified parts of digestive tract: Secondary | ICD-10-CM | POA: Insufficient documentation

## 2021-11-04 DIAGNOSIS — C7A019 Malignant carcinoid tumor of the small intestine, unspecified portion: Secondary | ICD-10-CM

## 2021-11-04 DIAGNOSIS — Z86010 Personal history of colonic polyps: Secondary | ICD-10-CM | POA: Insufficient documentation

## 2021-11-04 DIAGNOSIS — Z85030 Personal history of malignant carcinoid tumor of large intestine: Secondary | ICD-10-CM | POA: Insufficient documentation

## 2021-11-04 DIAGNOSIS — Z08 Encounter for follow-up examination after completed treatment for malignant neoplasm: Secondary | ICD-10-CM | POA: Diagnosis not present

## 2021-11-04 DIAGNOSIS — E039 Hypothyroidism, unspecified: Secondary | ICD-10-CM | POA: Insufficient documentation

## 2021-11-04 DIAGNOSIS — I10 Essential (primary) hypertension: Secondary | ICD-10-CM | POA: Diagnosis not present

## 2021-11-04 HISTORY — PX: COLONOSCOPY WITH PROPOFOL: SHX5780

## 2021-11-04 SURGERY — COLONOSCOPY WITH PROPOFOL
Anesthesia: General

## 2021-11-04 MED ORDER — LIDOCAINE HCL (CARDIAC) PF 100 MG/5ML IV SOSY
PREFILLED_SYRINGE | INTRAVENOUS | Status: DC | PRN
  Administered 2021-11-04: 50 mg via INTRAVENOUS

## 2021-11-04 MED ORDER — LACTATED RINGERS IV SOLN: INTRAVENOUS | Status: DC

## 2021-11-04 MED ORDER — PROPOFOL 10 MG/ML IV BOLUS
INTRAVENOUS | Status: DC | PRN
  Administered 2021-11-04: 100 mg via INTRAVENOUS

## 2021-11-04 NOTE — Anesthesia Preprocedure Evaluation (Signed)
Anesthesia Evaluation  Patient identified by MRN, date of birth, ID band Patient awake    Reviewed: Allergy & Precautions, H&P , NPO status , Patient's Chart, lab work & pertinent test results, reviewed documented beta blocker date and time   Airway Mallampati: II  TM Distance: >3 FB Neck ROM: full    Dental no notable dental hx.    Pulmonary neg pulmonary ROS,    Pulmonary exam normal breath sounds clear to auscultation       Cardiovascular Exercise Tolerance: Good hypertension, negative cardio ROS   Rhythm:regular Rate:Normal     Neuro/Psych negative neurological ROS  negative psych ROS   GI/Hepatic negative GI ROS, Neg liver ROS,   Endo/Other  Hypothyroidism   Renal/GU negative Renal ROS  negative genitourinary   Musculoskeletal   Abdominal   Peds  Hematology negative hematology ROS (+)   Anesthesia Other Findings   Reproductive/Obstetrics negative OB ROS                             Anesthesia Physical Anesthesia Plan  ASA: 2  Anesthesia Plan: General   Post-op Pain Management:    Induction:   PONV Risk Score and Plan: Propofol infusion  Airway Management Planned:   Additional Equipment:   Intra-op Plan:   Post-operative Plan:   Informed Consent: I have reviewed the patients History and Physical, chart, labs and discussed the procedure including the risks, benefits and alternatives for the proposed anesthesia with the patient or authorized representative who has indicated his/her understanding and acceptance.     Dental Advisory Given  Plan Discussed with: CRNA  Anesthesia Plan Comments:         Anesthesia Quick Evaluation

## 2021-11-04 NOTE — Interval H&P Note (Signed)
History and Physical Interval Note:  11/04/2021 7:22 AM  Hailey Randolph  has presented today for surgery, with the diagnosis of Malignant carcinoid tumor, ileum.  The various methods of treatment have been discussed with the patient and family. After consideration of risks, benefits and other options for treatment, the patient has consented to  Procedure(s): COLONOSCOPY WITH PROPOFOL (N/A) as a surgical intervention.  The patient's history has been reviewed, patient examined, no change in status, stable for surgery.  I have reviewed the patient's chart and labs.  Questions were answered to the patient's satisfaction.     Aviva Signs

## 2021-11-04 NOTE — Transfer of Care (Signed)
Immediate Anesthesia Transfer of Care Note  Patient: Hailey Randolph  Procedure(s) Performed: COLONOSCOPY WITH PROPOFOL  Patient Location: Endoscopy Unit  Anesthesia Type:General  Level of Consciousness: awake  Airway & Oxygen Therapy: Patient Spontanous Breathing  Post-op Assessment: Report given to RN and Post -op Vital signs reviewed and stable  Post vital signs: Reviewed and stable  Last Vitals:  Vitals Value Taken Time  BP    Temp    Pulse    Resp    SpO2      Last Pain:  Vitals:   11/04/21 0658  TempSrc: Oral  PainSc: 0-No pain      Patients Stated Pain Goal: 5 (27/78/24 2353)  Complications: No notable events documented.

## 2021-11-04 NOTE — Op Note (Signed)
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 Estimated Blood Loss:     Estimated blood loss: none. Procedure:                Pre-Anesthesia Assessment:                           - Prior to the procedure, a History and Physical                            was performed, and patient medications and                            allergies were reviewed. The patient is competent.                            The risks and benefits of the procedure and the                            sedation options and risks were discussed with the                            patient. All questions were answered and informed                            consent was obtained. Patient identification and                            proposed procedure were verified by the physician,                            the nurse, the anesthetist and the technician in                            the endoscopy suite. Mental Status Examination:                            alert and oriented. Airway Examination: normal  oropharyngeal airway and neck mobility. Respiratory                            Examination: clear to auscultation. CV Examination:                            RRR, no murmurs, no S3 or S4. Prophylactic                            Antibiotics: The patient does not require                            prophylactic antibiotics. Prior Anticoagulants: The                            patient has taken  no previous anticoagulant or                            antiplatelet agents. ASA Grade Assessment: II - A                            patient with mild systemic disease. After reviewing                            the risks and benefits, the patient was deemed in                            satisfactory condition to undergo the procedure.                            The anesthesia plan was to use deep sedation /                            analgesia. Immediately prior to administration of                            medications, the patient was re-assessed for                            adequacy to receive sedatives. The heart rate,                            respiratory rate, oxygen saturations, blood                            pressure, adequacy of pulmonary ventilation, and                            response to care were monitored throughout the                            procedure. The physical status of the patient was  re-assessed after the procedure.                           After obtaining informed consent, the colonoscope                            was passed under direct vision. Throughout the                            procedure, the patient's blood pressure, pulse, and                            oxygen saturations were monitored continuously. The                            [id number]) scope was introduced through the                            anus and advanced to the the ileocolonic                            anastomosis. No anatomical landmarks were                            photographed. The entire colon was well visualized.                            The colonoscopy was performed without difficulty.                            The patient tolerated the procedure well. The                            quality of the bowel preparation was adequate. The                            total duration of the procedure was 9 minutes. Scope In: 7:26:57  AM Scope Out: 7:35:32 AM Total Procedure Duration: 0 hours 8 minutes 35 seconds  Findings:      The perianal and digital rectal examinations were normal.      The entire examined colon appeared normal on direct and retroflexion       views. Impression:               - The entire examined colon is normal on direct and                            retroflexion views. Ileocolic anastomosis widely                            patient. No polyps, tumors seen.                           - No specimens collected. Moderate Sedation:      Moderate (conscious) sedation was personally administered by an  anesthesia professional. The following parameters were monitored: oxygen       saturation, heart rate, blood pressure, and response to care. Recommendation:           - Written discharge instructions were provided to                            the patient.                           - The signs and symptoms of potential delayed                            complications were discussed with the patient.                           - Patient has a contact number available for                            emergencies.                           - Return to normal activities tomorrow.                           - Resume previous diet.                           - Continue present medications.                           - No recommendation at this time regarding repeat                            colonoscopy due to no evidence of mucosal or other                            abnormalities on today's exam. Procedure Code(s):        --- Professional ---                           [phone], Colonoscopy, flexible; diagnostic, including                            collection of specimen(s) by brushing or washing,                            when performed (separate procedure) Diagnosis Code(s):        --- Professional ---                           C7A.019, Malignant carcinoid tumor of the small                             intestine, unspecified portion CPT copyright 2019 American Medical Association. All rights reserved. The codes documented in this report are preliminary and upon coder review may  be  revised to meet current compliance requirements. Aviva Signs, MD Aviva Signs, MD 11/04/2021 7:44:34 AM This report has been signed electronically. Number of Addenda: 0

## 2021-11-04 NOTE — Anesthesia Procedure Notes (Signed)
Date/Time: 11/04/2021 7:31 AM  Performed by: Orlie Dakin, CRNAPre-anesthesia Checklist: Patient identified, Emergency Drugs available, Suction available and Patient being monitored Patient Re-evaluated:Patient Re-evaluated prior to induction Oxygen Delivery Method: Nasal cannula Induction Type: IV induction Placement Confirmation: positive ETCO2

## 2021-11-04 NOTE — OR Nursing (Signed)
At anastomosis 0732 per Dr. Arnoldo Morale

## 2021-11-04 NOTE — Anesthesia Postprocedure Evaluation (Signed)
Anesthesia Post Note  Patient: Hailey Randolph  Procedure(s) Performed: COLONOSCOPY WITH PROPOFOL  Patient location during evaluation: Phase II Anesthesia Type: General Level of consciousness: awake Pain management: pain level controlled Vital Signs Assessment: post-procedure vital signs reviewed and stable Respiratory status: spontaneous breathing and respiratory function stable Cardiovascular status: blood pressure returned to baseline and stable Postop Assessment: no headache and no apparent nausea or vomiting Anesthetic complications: no Comments: Late entry   No notable events documented.   Last Vitals:  Vitals:   11/04/21 0658 11/04/21 0739  BP: 135/70 115/62  Pulse: 67   Resp: 16 15  Temp: 36.5 C 36.5 C  SpO2: 96% 98%    Last Pain:  Vitals:   11/04/21 0739  TempSrc: Oral  PainSc: 0-No pain                 Louann Sjogren

## 2021-11-11 ENCOUNTER — Encounter (HOSPITAL_COMMUNITY): Payer: Self-pay | Admitting: General Surgery

## 2021-12-05 ENCOUNTER — Other Ambulatory Visit (HOSPITAL_COMMUNITY): Payer: Self-pay

## 2021-12-08 ENCOUNTER — Inpatient Hospital Stay (HOSPITAL_COMMUNITY): Payer: Medicare Other | Attending: Hematology

## 2021-12-08 DIAGNOSIS — N62 Hypertrophy of breast: Secondary | ICD-10-CM | POA: Insufficient documentation

## 2021-12-08 DIAGNOSIS — Z803 Family history of malignant neoplasm of breast: Secondary | ICD-10-CM | POA: Insufficient documentation

## 2021-12-08 DIAGNOSIS — C7A012 Malignant carcinoid tumor of the ileum: Secondary | ICD-10-CM

## 2021-12-08 DIAGNOSIS — Z79899 Other long term (current) drug therapy: Secondary | ICD-10-CM | POA: Diagnosis not present

## 2021-12-08 DIAGNOSIS — I10 Essential (primary) hypertension: Secondary | ICD-10-CM | POA: Insufficient documentation

## 2021-12-08 DIAGNOSIS — Z86010 Personal history of colonic polyps: Secondary | ICD-10-CM | POA: Diagnosis not present

## 2021-12-08 DIAGNOSIS — Z85060 Personal history of malignant carcinoid tumor of small intestine: Secondary | ICD-10-CM | POA: Insufficient documentation

## 2021-12-08 LAB — CBC WITH DIFFERENTIAL/PLATELET
Abs Immature Granulocytes: 0.02 10*3/uL (ref 0.00–0.07)
Basophils Absolute: 0 10*3/uL (ref 0.0–0.1)
Basophils Relative: 1 %
Eosinophils Absolute: 0.1 10*3/uL (ref 0.0–0.5)
Eosinophils Relative: 2 %
HCT: 40.4 % (ref 36.0–46.0)
Hemoglobin: 13.8 g/dL (ref 12.0–15.0)
Immature Granulocytes: 0 %
Lymphocytes Relative: 29 %
Lymphs Abs: 2.1 10*3/uL (ref 0.7–4.0)
MCH: 33.8 pg (ref 26.0–34.0)
MCHC: 34.2 g/dL (ref 30.0–36.0)
MCV: 99 fL (ref 80.0–100.0)
Monocytes Absolute: 0.6 10*3/uL (ref 0.1–1.0)
Monocytes Relative: 8 %
Neutro Abs: 4.4 10*3/uL (ref 1.7–7.7)
Neutrophils Relative %: 60 %
Platelets: 253 10*3/uL (ref 150–400)
RBC: 4.08 MIL/uL (ref 3.87–5.11)
RDW: 12.8 % (ref 11.5–15.5)
WBC: 7.3 10*3/uL (ref 4.0–10.5)
nRBC: 0 % (ref 0.0–0.2)

## 2021-12-08 LAB — COMPREHENSIVE METABOLIC PANEL
ALT: 22 U/L (ref 0–44)
AST: 21 U/L (ref 15–41)
Albumin: 3.5 g/dL (ref 3.5–5.0)
Alkaline Phosphatase: 62 U/L (ref 38–126)
Anion gap: 7 (ref 5–15)
BUN: 11 mg/dL (ref 8–23)
CO2: 26 mmol/L (ref 22–32)
Calcium: 8.5 mg/dL — ABNORMAL LOW (ref 8.9–10.3)
Chloride: 106 mmol/L (ref 98–111)
Creatinine, Ser: 0.83 mg/dL (ref 0.44–1.00)
GFR, Estimated: >60 mL/min (ref >60)
Glucose, Bld: 94 mg/dL (ref 70–99)
Potassium: 3.5 mmol/L (ref 3.5–5.1)
Sodium: 139 mmol/L (ref 135–145)
Total Bilirubin: 0.9 mg/dL (ref 0.3–1.2)
Total Protein: 6.3 g/dL — ABNORMAL LOW (ref 6.5–8.1)

## 2021-12-09 LAB — CHROMOGRANIN A: Chromogranin A (ng/mL): 79.5 ng/mL (ref 0.0–101.8)

## 2021-12-15 ENCOUNTER — Inpatient Hospital Stay (HOSPITAL_BASED_OUTPATIENT_CLINIC_OR_DEPARTMENT_OTHER): Payer: Medicare Other | Admitting: Hematology

## 2021-12-15 VITALS — BP 131/81 | HR 62 | Temp 97.9°F | Resp 18 | Wt 179.0 lb

## 2021-12-15 DIAGNOSIS — C7A012 Malignant carcinoid tumor of the ileum: Secondary | ICD-10-CM | POA: Diagnosis not present

## 2021-12-15 DIAGNOSIS — N62 Hypertrophy of breast: Secondary | ICD-10-CM | POA: Diagnosis not present

## 2021-12-15 DIAGNOSIS — Z803 Family history of malignant neoplasm of breast: Secondary | ICD-10-CM | POA: Diagnosis not present

## 2021-12-15 DIAGNOSIS — Z86010 Personal history of colonic polyps: Secondary | ICD-10-CM | POA: Diagnosis not present

## 2021-12-15 DIAGNOSIS — I10 Essential (primary) hypertension: Secondary | ICD-10-CM | POA: Diagnosis not present

## 2021-12-15 DIAGNOSIS — Z85060 Personal history of malignant carcinoid tumor of small intestine: Secondary | ICD-10-CM | POA: Diagnosis not present

## 2021-12-15 DIAGNOSIS — Z79899 Other long term (current) drug therapy: Secondary | ICD-10-CM | POA: Diagnosis not present

## 2021-12-15 NOTE — Progress Notes (Signed)
Hailey Randolph, [place] [address]   CLINIC:  Medical Oncology/Hematology  PCP:  Asencion Noble, MD [address] 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 Small bowel resection on 07/24/2019. 2. Right hemi-colectomy on 10/27/2019.  NGS Results: not done  CURRENT THERAPY: surveillance  CANCER STAGING: Cancer Staging  Carcinoid tumor of small intestine Staging form: Small Intestine - Other Histologies, AJCC 8th Edition - Clinical stage from 08/10/2019: cT4, cN0, cM0 - Unsigned   INTERVAL HISTORY:  [name], a 71 y.o. female, returns for routine follow-up of her well-differentiated neuroendocrine tumor of small intestine. Shiquita was last seen on 08/01/2020.   Today she reports feeling good. She denies flushing, wheezing, diarrhea, and abdominal pain. Her weight is stable, and she reports is trying to lose weight. She does not take calcium supplements.   REVIEW OF SYSTEMS:  Review of Systems  Constitutional:  Negative for appetite change, fatigue and unexpected weight change.  Respiratory:  Negative for wheezing.   Gastrointestinal:  Negative for abdominal pain and diarrhea.  Neurological:  Positive for numbness.  Psychiatric/Behavioral:  Positive for sleep disturbance.   All other systems reviewed and are negative.   PAST MEDICAL/SURGICAL HISTORY:  Past Medical History:  Diagnosis Date   Adenomatous colon polyp    Carcinoid tumor of small intestine    Essential hypertension    Hyperlipidemia    Hypothyroidism    Insomnia    Osteoarthritis    Past Surgical History:  Procedure Laterality Date   ABDOMINAL HYSTERECTOMY     APPENDECTOMY     BIOPSY  08/14/2016   Procedure: BIOPSY;  Surgeon: Daneil Dolin, MD;  [facility];  Service: Endoscopy;;  gastric   BOWEL RESECTION N/A 07/24/2019    Procedure: PARTIAL SMALL BOWEL RESECTION;  Surgeon: Aviva Signs, MD;  Location: [facility];  Service: General;  Laterality: N/A;   BREAST EXCISIONAL BIOPSY Left 10/30/2016   BREAST EXCISIONAL BIOPSY Right 10/30/2016   COLON SURGERY     COLONOSCOPY  08/2005   Adenomatous polyp, pedunculated, at 30 cm. Scattered left-sided diverticula   COLONOSCOPY N/A 01/17/2016   Dr. Gala Romney: 5 mm polyp in the cecum, tubular adenoma. Scattered small and large mouth diverticula in the sigmoid colon. Next colonoscopy 5 years.   COLONOSCOPY WITH PROPOFOL N/A 11/04/2021   Procedure: COLONOSCOPY WITH PROPOFOL;  Surgeon: Aviva Signs, MD;  [facility];  Service: Gastroenterology;  Laterality: N/A;   ESOPHAGOGASTRODUODENOSCOPY N/A 08/14/2016   Procedure: ESOPHAGOGASTRODUODENOSCOPY (EGD);  Surgeon: Daneil Dolin, MD;  [facility];  Service: Endoscopy;  Laterality: N/A;  215   hysterectomy     complete   LAPAROTOMY N/A 07/24/2019   Procedure: EXPLORATORY LAPAROTOMY;  Surgeon: Aviva Signs, MD;  Location: [facility];  Service: General;  Laterality: N/A;   PARTIAL COLECTOMY N/A 10/27/2019   Procedure: RIGHT HEMI-COLECTOMY;  Surgeon: Aviva Signs, MD;  Location: [facility];  Service: General;  Laterality: N/A;   RADIOACTIVE SEED GUIDED EXCISIONAL BREAST BIOPSY Bilateral 11/02/2016   Procedure: BILATERAL RADIOACTIVE SEED GUIDED EXCISIONAL BREAST BIOPSY LEFT BREAST 2 SEEDS RIGHT BREAST 1 SEED;  Surgeon: Rolm Bookbinder, MD;  Location: [facility];  Service: General;  Laterality: Bilateral;   TONSILLECTOMY      SOCIAL HISTORY:  Social History   Socioeconomic History   Marital status: Widowed  Spouse name: Not on file   Number of children: 2   Years of education: Not on file   Highest education level: Not on file  Occupational History   Occupation: retired    Comment: Research officer, trade union division  Tobacco Use   Smoking status: Never   Smokeless tobacco: Never  Vaping Use   Vaping  Use: Never used  Substance and Sexual Activity   Alcohol use: No   Drug use: No   Sexual activity: Not Currently  Other Topics Concern   Not on file  Social History Narrative   Not on file   Social Determinants of Health   Financial Resource Strain: Low Risk  (08/09/2019)   Overall Financial Resource Strain (CARDIA)    Difficulty of Paying Living Expenses: Not hard at all  Food Insecurity: No Food Insecurity (08/09/2019)   Hunger Vital Sign    Worried About Running Out of Food in the Last Year: Never true    [place] in the Last Year: Never true  Transportation Needs: No Transportation Needs (08/09/2019)   PRAPARE - Hydrologist (Medical): No    Lack of Transportation (Non-Medical): No  Physical Activity: Insufficiently Active (08/09/2019)   Exercise Vital Sign    Days of Exercise per Week: 3 days    Minutes of Exercise per Session: 30 min  Stress: No Stress Concern Present (08/09/2019)   [place]    Feeling of Stress : Only a little  Social Connections: Moderately Isolated (08/09/2019)   Social Connection and Isolation Panel [NHANES]    Frequency of Communication with Friends and Family: More than three times a week    Frequency of Social Gatherings with Friends and Family: More than three times a week    Attends Religious Services: More than 4 times per year    Active Member of [employer] or Organizations: No    Attends Archivist Meetings: Never    Marital Status: Widowed  Intimate Partner Violence: Not At Risk (08/09/2019)   Humiliation, Afraid, Rape, and Kick questionnaire    Fear of Current or Ex-Partner: No    Emotionally Abused: No    Physically Abused: No    Sexually Abused: No    FAMILY HISTORY:  Family History  Problem Relation Age of Onset   Cancer Mother    Breast cancer Mother 93   Colon polyps Mother    Hypertension Father    Heart attack Father 31        deceased   Arthritis Sister    Liver disease Neg Hx     CURRENT MEDICATIONS:  Current Outpatient Medications  Medication Sig Dispense Refill   acetaminophen (TYLENOL) 650 MG CR tablet Take 650-1,300 mg by mouth every 8 (eight) hours as needed for pain.     atorvastatin (LIPITOR) 20 MG tablet Take 20 mg by mouth at bedtime.     diphenhydrAMINE (BENADRYL) 25 MG tablet Take 50 mg by mouth daily as needed for allergies.     hydrochlorothiazide (HYDRODIURIL) 25 MG tablet Take 25 mg by mouth daily.     levothyroxine (SYNTHROID, LEVOTHROID) 75 MCG tablet Take 75 mcg by mouth daily before breakfast.     LORazepam (ATIVAN) 1 MG tablet Take 1 mg by mouth daily as needed for anxiety.     losartan (COZAAR) 100 MG tablet Take 100 mg by mouth daily.     Melatonin 5 MG CAPS Take 5  mg by mouth at bedtime as needed (sleep).     potassium chloride SA (KLOR-CON M) 20 MEQ tablet Take 20 mEq by mouth daily.     traMADol (ULTRAM) 50 MG tablet Take 50 mg by mouth every 6 (six) hours as needed for moderate pain.     zolpidem (AMBIEN) 10 MG tablet Take 5 mg by mouth at bedtime as needed for sleep.     No current facility-administered medications for this visit.    ALLERGIES:  No Known Allergies  PHYSICAL EXAM:  Performance status (ECOG): 1 - Symptomatic but completely ambulatory  There were no vitals filed for this visit. Wt Readings from Last 3 Encounters:  10/23/21 182 lb (82.6 kg)  10/13/21 182 lb 6.4 oz (82.7 kg)  12/26/20 176 lb (79.8 kg)   Physical Exam Vitals reviewed.  Constitutional:      Appearance: Normal appearance. She is obese.  Cardiovascular:     Rate and Rhythm: Normal rate and regular rhythm.     Pulses: Normal pulses.     Heart sounds: Normal heart sounds.  Pulmonary:     Effort: Pulmonary effort is normal.     Breath sounds: Normal breath sounds.  Abdominal:     Palpations: Abdomen is soft. There is no hepatomegaly, splenomegaly or mass.     Tenderness: There is no  abdominal tenderness.  Neurological:     General: No focal deficit present.     Mental Status: She is alert and oriented to person, place, and time.  Psychiatric:        Mood and Affect: Mood normal.        Behavior: Behavior normal.      LABORATORY DATA:  I have reviewed the labs as listed.     Latest Ref Rng & Units 12/08/2021   11:08 AM 06/09/2021    9:29 AM 11/26/2020   10:00 AM  CBC  WBC 4.0 - 10.5 K/uL 7.3  9.6  8.0   Hemoglobin 12.0 - 15.0 g/dL 13.8  14.1  14.5   Hematocrit 36.0 - 46.0 % 40.4  42.7  44.0   Platelets 150 - 400 K/uL 253  283  324       Latest Ref Rng & Units 12/08/2021   11:08 AM 11/03/2021    7:53 AM 06/09/2021    9:29 AM  CMP  Glucose 70 - 99 mg/dL 94  92  96   BUN 8 - 23 mg/dL $Remove'11  12  12   'VojDlWm$ Creatinine 0.44 - 1.00 mg/dL 0.83  0.90  0.93   Sodium 135 - 145 mmol/L 139  137  139   Potassium 3.5 - 5.1 mmol/L 3.5  3.1  3.4   Chloride 98 - 111 mmol/L 106  107  106   CO2 22 - 32 mmol/L $RemoveB'26  24  27   'wzNFaDvM$ Calcium 8.9 - 10.3 mg/dL 8.5  8.7  9.0   Total Protein 6.5 - 8.1 g/dL 6.3   6.8   Total Bilirubin 0.3 - 1.2 mg/dL 0.9   0.9   Alkaline Phos 38 - 126 U/L 62   65   AST 15 - 41 U/L 21   21   ALT 0 - 44 U/L 22   23     DIAGNOSTIC IMAGING:  I have independently reviewed the scans and discussed with the patient. No results found.   ASSESSMENT:  1.  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 -Exploratory laparotomy and resection of small bowel on 07/24/2019. -Pathology with grade 1 well-differentiated neuroendocrine tumor, 2 cm, unifocal, mitotic index 1/10 hpf, Ki-67 1%, tumor invades into the serosal surface, margins negative, no LVI, 2 tumor deposits measuring 0.4 cm and 0.5 cm, 0/18 lymph nodes positive, PT4PN0. -24-hour urine for 5-HIAA was negative.  Serum chromogranin was 54.7. -PET scan on 09/04/2019 showed single focus of  radiotracer avid nodularity along the serosal surface of the cecum measuring 8 mm consistent with well-differentiated neuroendocrine tumor metastasis.  No evidence of residual tumor at the anastomosis.  No evidence of liver mets.  No evidence of distal disease. -Right hemicolectomy on 10/27/2019 which showed well-differentiated neuroendocrine tumor, 1 cm involving the wall of the cecum, grade 1.  0/25 lymph nodes involved.   2.  Left breast usual ductal hyperplasia: -Lumpectomy of the left breast on 11/02/2016 showing UDH, fibrocystic changes.  No evidence of malignancy. -Family history of breast cancer in the mother at age 73.    PLAN:  1.  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 - Colonoscopy (11/04/2021): Anastomotic site is within normal limits. - Reviewed labs from 12/08/2021 with normal LFTs, CBC and chromogranin levels. - Recommend follow-up in 6 months with repeat chromogranin and CTAP with contrast.   2.  Left breast usual ductal hyperplasia: -Mammograms on 10/06/2021 and 10/09/2021 was BI-RADS Category 2.   3.  Hypertension: - Continue HCTZ and losartan.   Orders placed this encounter:  No orders of the defined types were placed in this encounter.    Derek Jack, MD [place] [phone]   I, Thana Ates, am acting as a scribe for Dr. Derek Jack.  I, Derek Jack MD, have reviewed the above documentation for accuracy and completeness, and I agree with the above.

## 2021-12-15 NOTE — Patient Instructions (Addendum)
[place] [facility] Discharge Instructions  You were seen and examined today by Dr. Delton Coombes.  Dr. Delton Coombes discussed your most recent lab work and everything looks good.  Follow-up as scheduled in 6 months.    Thank you for choosing [place] [facility] to provide your oncology and hematology care.  To afford each patient quality time with our provider, please arrive at least 15 minutes before your scheduled appointment time.   If you have a lab appointment with the [facility] please come in thru the Main Entrance and check in at the main information desk.  You need to re-schedule your appointment should you arrive 10 or more minutes late.  We strive to give you quality time with our providers, and arriving late affects you and other patients whose appointments are after yours.  Also, if you no show three or more times for appointments you may be dismissed from the clinic at the providers discretion.     Again, thank you for choosing [facility].  Our hope is that these requests will decrease the amount of time that you wait before being seen by our physicians.       _____________________________________________________________  Should you have questions after your visit to [facility], please contact our office at [phone] and follow the prompts.  Our office hours are 8:00 a.m. and 4:30 p.m. Monday - Friday.  Please note that voicemails left after 4:00 p.m. may not be returned until the following business day.  We are closed weekends and major holidays.  You do have access to a nurse 24-7, just call the main number to the clinic [phone] and do not press any options, hold on the line and a nurse will answer the phone.    For prescription refill requests, have your pharmacy contact our office and allow 72 hours.    Due to Covid, you will need to wear a mask upon entering the hospital. If you do  not have a mask, a mask will be given to you at the Main Entrance upon arrival. For doctor visits, patients may have 1 support person age 2 or older with them. For treatment visits, patients can not have anyone with them due to social distancing guidelines and our immunocompromised population.

## 2022-02-23 DIAGNOSIS — Z23 Encounter for immunization: Secondary | ICD-10-CM | POA: Diagnosis not present

## 2022-03-20 LAB — 5 HIAA, QUANTITATIVE, URINE, 24 HOUR: 5-HIAA, Ur: 1.6 mg/L

## 2022-05-04 IMAGING — CT CT ABD-PELV W/ CM
2 of 5 series · 15 of 46 positions shown, 17 images · IV contrast (Omnipaque or Isovue)
Comparison: PET-CT 09/04/2019.  Abdominopelvic CT 07/20/2019.

CLINICAL DATA: History of small bowel carcinoid tumor status post
partial small bowel resection (07/24/2019) and right hemicolectomy
(10/27/2019). History of breast cancer. Previous hysterectomy and
appendectomy. Right inguinal pain.

EXAM:
CT ABDOMEN AND PELVIS WITH CONTRAST
TECHNIQUE: Multidetector CT imaging of the abdomen and pelvis was performed
using the standard protocol following bolus administration of
intravenous contrast.
CONTRAST:  100mL OMNIPAQUE IOHEXOL 300 MG/ML  SOLN

[Series 2: axial st · axial · 0.84mm/px · z∈[+718,+1144]mm · 12 of 95 slices shown, 14 images]
[im 5/95  soft-tissue]
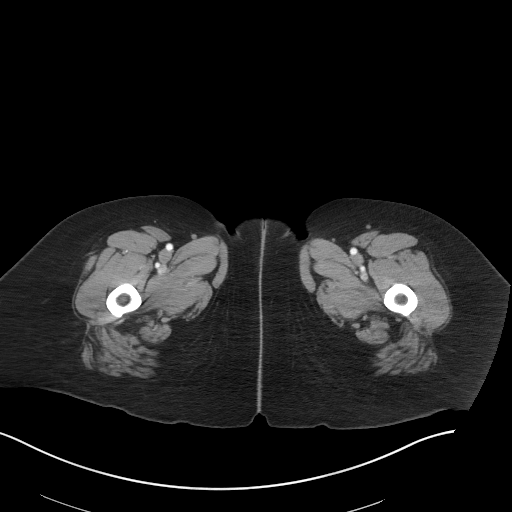
[im 5/95  bone]
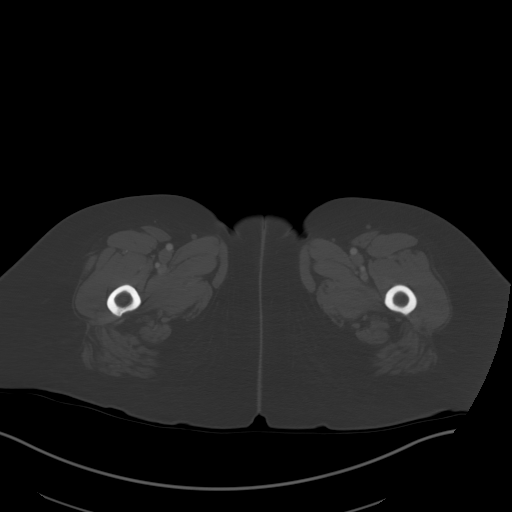
[im 15/95  soft-tissue]
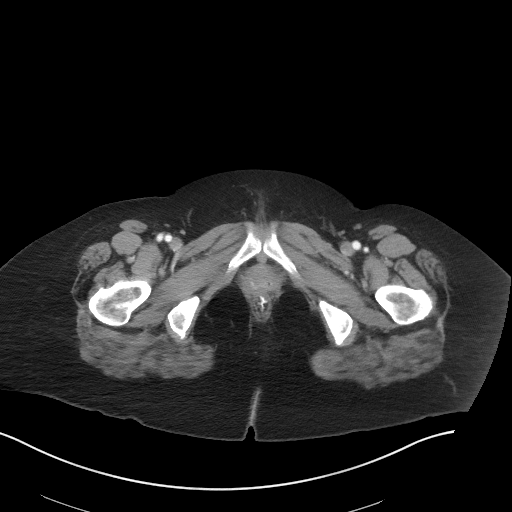
[im 19/95  soft-tissue]
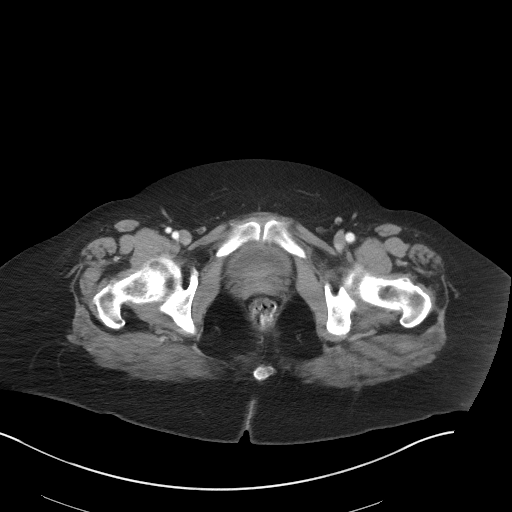
[im 29/95  soft-tissue]
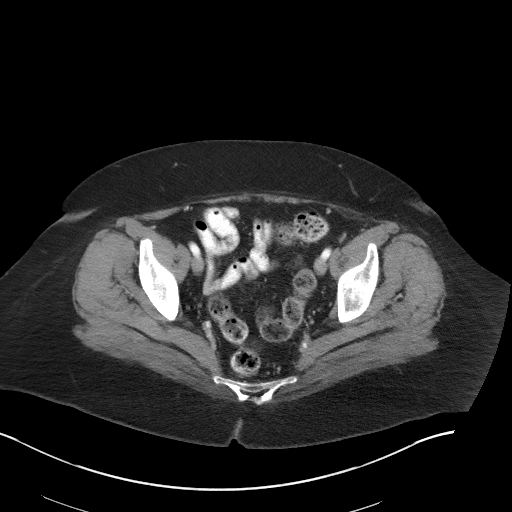
[im 38/95  soft-tissue]
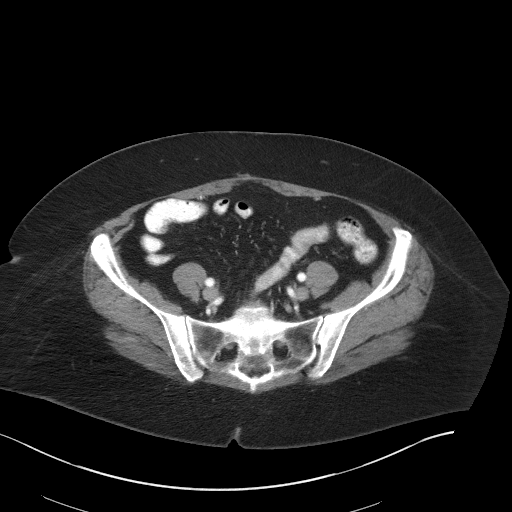
[im 43/95  soft-tissue]
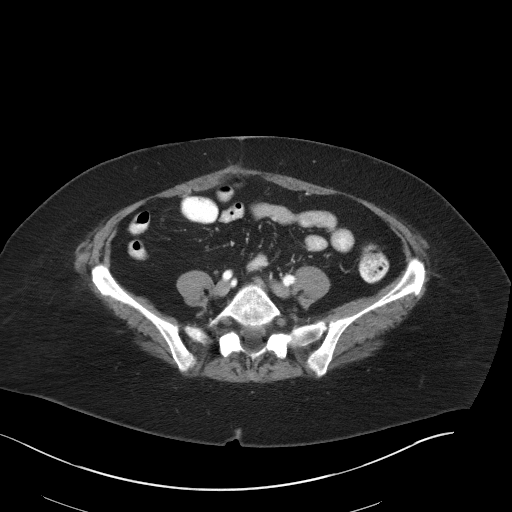
[im 52/95  soft-tissue]
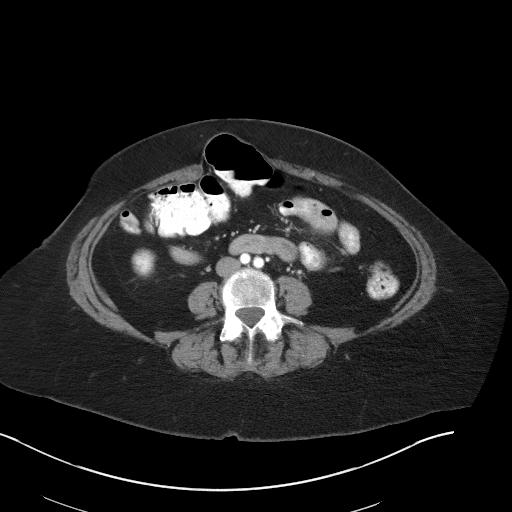
[im 57/95  soft-tissue]
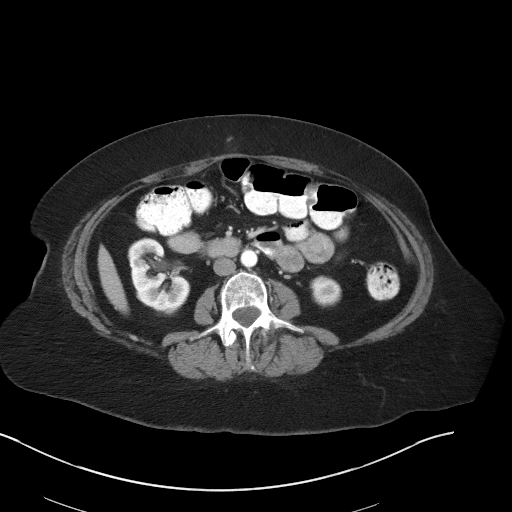
[im 66/95  soft-tissue]
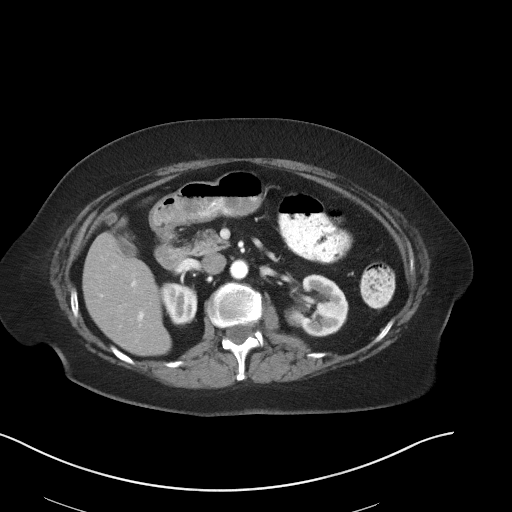
[im 66/95  bone]
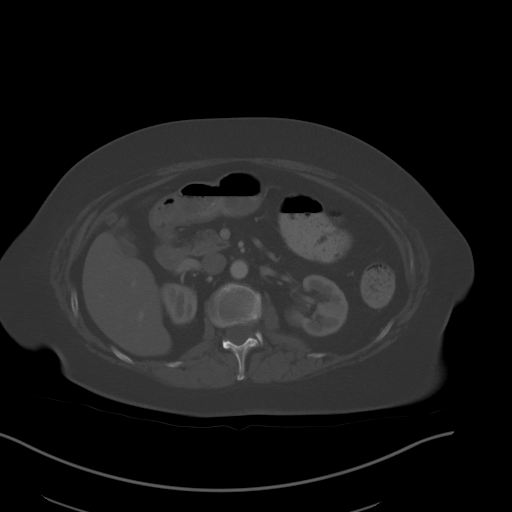
[im 76/95  soft-tissue]
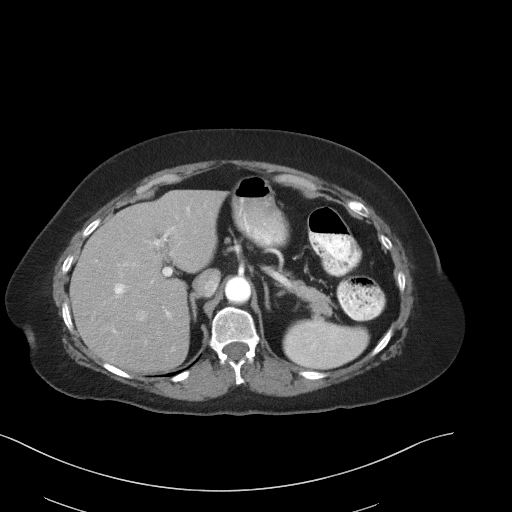
[im 80/95  soft-tissue]
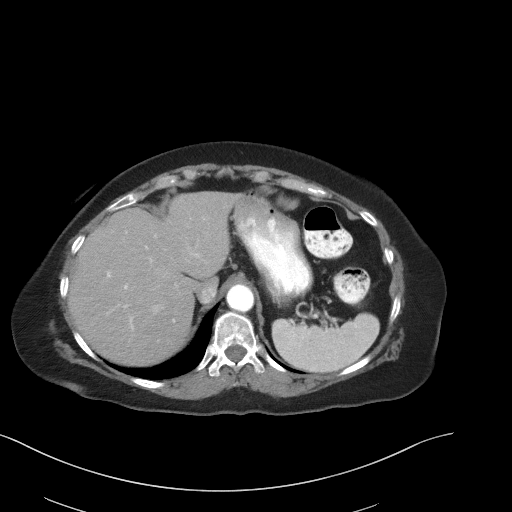
[im 90/95  soft-tissue]
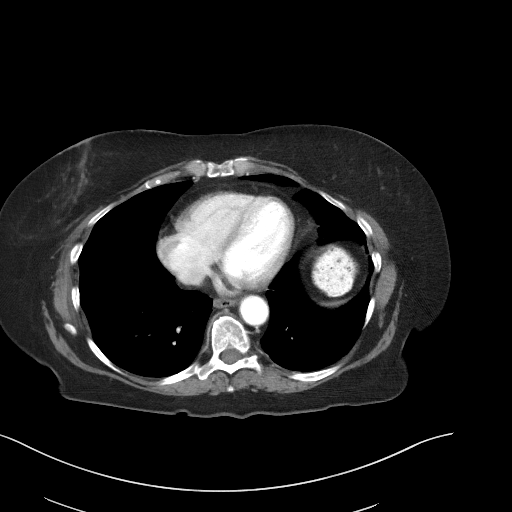

[Series 5: coronal st · coronal · 0.82mm/px · 3 of 104 slices shown]
[im 35/104  soft-tissue]
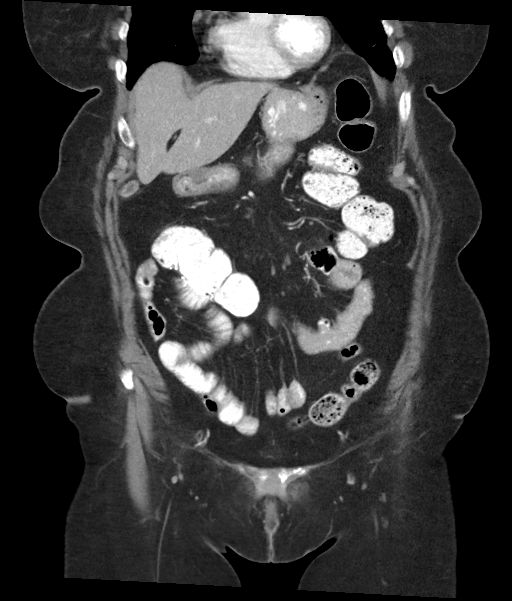
[im 46/104  soft-tissue]
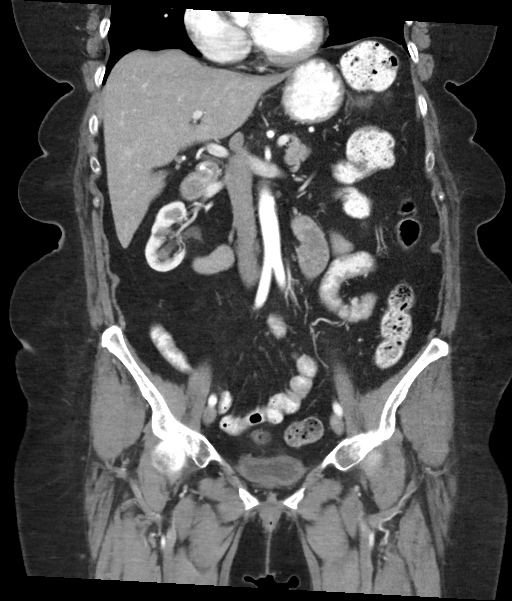
[im 58/104  soft-tissue]
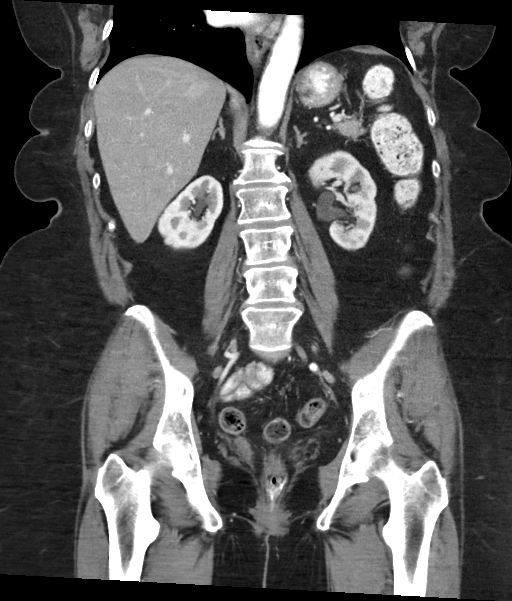

[15 of 46 positions shown; findings below may reference images not displayed]

FINDINGS: Lower chest: Interval improved aeration of the lung bases with mild
residual subsegmental atelectasis or scarring. No suspicious
nodularity or pleural effusion.

Hepatobiliary: There is hypervascular lesion anteriorly in the right
hepatic lobe (segment 5) which measures 1.0 x 0.9 cm on image [DATE].
This is not clearly seen on the patient's prior studies and could
reflect a hypervascular metastasis. No other focal hepatic
abnormalities are identified. The gallbladder is incompletely
distended. No evidence of gallstones, gallbladder wall thickening or
biliary dilatation.

Pancreas: Unremarkable. No pancreatic ductal dilatation or
surrounding inflammatory changes.

Spleen: Normal in size without focal abnormality.

Adrenals/Urinary Tract: Both adrenal glands appear normal. The
kidneys appear stable without suspicious findings. There is a small
cortical cyst in the lower pole of the right kidney and small
parapelvic cysts bilaterally. No urinary tract calculus or
hydronephrosis. The bladder appears unremarkable for its degree of
distention.

Stomach/Bowel: Enteric contrast was administered. Since the PET-CT,
the patient has undergone right hemicolectomy. The ileocolonic
anastomosis appears patent without evidence of local recurrence. The
mid transverse colon extends into a midline abdominal wall hernia.
No evidence of incarceration or obstruction. No bowel lesions are
seen.

Vascular/Lymphatic: There are no enlarged abdominal or pelvic lymph
nodes. Small celiac nodes are unchanged, not hypermetabolic on prior
PET-CT. No significant vascular findings. The portal, superior
mesenteric and splenic veins are patent.

Reproductive: Hysterectomy.  No adnexal mass.

Other: As above, supraumbilical hernia containing transverse colon.
No ascites or peritoneal nodularity.

Musculoskeletal: No acute or significant osseous findings.
IMPRESSION: 1. 1 cm hypervascular lesion in segment 5 of the liver, not seen
previously and suspicious for a hypervascular metastasis in this
patient with a history of carcinoid tumor. Correlate with 5-HIAA
levels. Consider further evaluation with abdominal MRI without and
with contrast.
2. Interval right hemicolectomy. No evidence of local recurrence.
3. Supraumbilical hernia containing transverse colon, without
evidence of incarceration or obstruction.

## 2022-06-11 ENCOUNTER — Other Ambulatory Visit: Payer: Self-pay | Admitting: Hematology

## 2022-06-11 ENCOUNTER — Encounter: Payer: Self-pay | Admitting: Hematology

## 2022-06-11 MED ORDER — PROCHLORPERAZINE MALEATE 10 MG PO TABS: 10.0000 mg | ORAL_TABLET | Freq: Four times a day (QID) | ORAL | 0 refills | Status: AC | PRN

## 2022-06-11 NOTE — Progress Notes (Signed)
Message sent to Hailey Jack, MD- Patient called asking if you could send in some nausea medication. She states that the contrast for the scan makes her really nauseous. She uses [facility] in [place].  Dr. Tomie China response- Call and tell her that she should take Compazine which was sent to her pharmacy 30 minutes prior to her scan.   Patient is aware and agreeable with plan to take it 30 minutes prior to scan.

## 2022-06-15 ENCOUNTER — Inpatient Hospital Stay: Payer: Medicare Other | Attending: Hematology

## 2022-06-15 ENCOUNTER — Ambulatory Visit (HOSPITAL_COMMUNITY)
Admission: RE | Admit: 2022-06-15 | Discharge: 2022-06-15 | Disposition: A | Discharge status: Home / Self Care | Payer: Medicare Other | Source: Ambulatory Visit | Attending: Hematology | Admitting: Hematology

## 2022-06-15 DIAGNOSIS — C7A012 Malignant carcinoid tumor of the ileum: Secondary | ICD-10-CM | POA: Insufficient documentation

## 2022-06-15 DIAGNOSIS — N62 Hypertrophy of breast: Secondary | ICD-10-CM | POA: Diagnosis not present

## 2022-06-15 DIAGNOSIS — E876 Hypokalemia: Secondary | ICD-10-CM | POA: Diagnosis not present

## 2022-06-15 DIAGNOSIS — R109 Unspecified abdominal pain: Secondary | ICD-10-CM | POA: Diagnosis not present

## 2022-06-15 DIAGNOSIS — K769 Liver disease, unspecified: Secondary | ICD-10-CM | POA: Diagnosis not present

## 2022-06-15 DIAGNOSIS — C7A019 Malignant carcinoid tumor of the small intestine, unspecified portion: Secondary | ICD-10-CM | POA: Insufficient documentation

## 2022-06-15 LAB — COMPREHENSIVE METABOLIC PANEL
ALT: 17 U/L (ref 0–44)
AST: 22 U/L (ref 15–41)
Albumin: 3.7 g/dL (ref 3.5–5.0)
Alkaline Phosphatase: 61 U/L (ref 38–126)
Anion gap: 11 (ref 5–15)
BUN: 10 mg/dL (ref 8–23)
CO2: 25 mmol/L (ref 22–32)
Calcium: 9 mg/dL (ref 8.9–10.3)
Chloride: 102 mmol/L (ref 98–111)
Creatinine, Ser: 0.95 mg/dL (ref 0.44–1.00)
GFR, Estimated: >60 mL/min (ref >60)
Glucose, Bld: 91 mg/dL (ref 70–99)
Potassium: 2.8 mmol/L — ABNORMAL LOW (ref 3.5–5.1)
Sodium: 138 mmol/L (ref 135–145)
Total Bilirubin: 0.8 mg/dL (ref 0.3–1.2)
Total Protein: 7 g/dL (ref 6.5–8.1)

## 2022-06-15 LAB — CBC WITH DIFFERENTIAL/PLATELET
Abs Immature Granulocytes: 0.03 10*3/uL (ref 0.00–0.07)
Basophils Absolute: 0 10*3/uL (ref 0.0–0.1)
Basophils Relative: 0 %
Eosinophils Absolute: 0.2 10*3/uL (ref 0.0–0.5)
Eosinophils Relative: 2 %
HCT: 42.4 % (ref 36.0–46.0)
Hemoglobin: 14.5 g/dL (ref 12.0–15.0)
Immature Granulocytes: 0 %
Lymphocytes Relative: 25 %
Lymphs Abs: 2.4 10*3/uL (ref 0.7–4.0)
MCH: 34.6 pg — ABNORMAL HIGH (ref 26.0–34.0)
MCHC: 34.2 g/dL (ref 30.0–36.0)
MCV: 101.2 fL — ABNORMAL HIGH (ref 80.0–100.0)
Monocytes Absolute: 0.8 10*3/uL (ref 0.1–1.0)
Monocytes Relative: 8 %
Neutro Abs: 6.1 10*3/uL (ref 1.7–7.7)
Neutrophils Relative %: 65 %
Platelets: 229 10*3/uL (ref 150–400)
RBC: 4.19 MIL/uL (ref 3.87–5.11)
RDW: 13.9 % (ref 11.5–15.5)
WBC: 9.5 10*3/uL (ref 4.0–10.5)
nRBC: 0 % (ref 0.0–0.2)

## 2022-06-15 MED ORDER — IOHEXOL 300 MG/ML  SOLN
100.0000 mL | Freq: Once | INTRAMUSCULAR | Status: AC | PRN
Start: 2022-06-15 — End: 2022-06-15
  Administered 2022-06-15: 100 mL via INTRAVENOUS

## 2022-06-16 ENCOUNTER — Other Ambulatory Visit: Payer: Self-pay | Admitting: Hematology

## 2022-06-16 LAB — CHROMOGRANIN A: Chromogranin A (ng/mL): 88.4 ng/mL (ref 0.0–101.8)

## 2022-06-17 ENCOUNTER — Other Ambulatory Visit: Payer: Self-pay | Admitting: *Deleted

## 2022-06-17 DIAGNOSIS — E876 Hypokalemia: Secondary | ICD-10-CM

## 2022-06-17 NOTE — Progress Notes (Signed)
Per Dr. Delton Coombes patient advised to take 56mq po q3h x2 today even if she took her potassium. we will nee to check K when she comes to clinic.  Appointment made for lab. Patient verbalized understanding.

## 2022-06-22 ENCOUNTER — Inpatient Hospital Stay: Payer: Medicare Other

## 2022-06-22 ENCOUNTER — Inpatient Hospital Stay (HOSPITAL_BASED_OUTPATIENT_CLINIC_OR_DEPARTMENT_OTHER): Payer: Medicare Other | Admitting: Hematology

## 2022-06-22 ENCOUNTER — Encounter: Payer: Self-pay | Admitting: Hematology

## 2022-06-22 VITALS — BP 131/92 | HR 78 | Temp 97.5°F | Resp 18 | Ht 62.0 in | Wt 175.1 lb

## 2022-06-22 DIAGNOSIS — C7A012 Malignant carcinoid tumor of the ileum: Secondary | ICD-10-CM

## 2022-06-22 DIAGNOSIS — E876 Hypokalemia: Secondary | ICD-10-CM

## 2022-06-22 DIAGNOSIS — C7A019 Malignant carcinoid tumor of the small intestine, unspecified portion: Secondary | ICD-10-CM | POA: Diagnosis not present

## 2022-06-22 DIAGNOSIS — N62 Hypertrophy of breast: Secondary | ICD-10-CM | POA: Diagnosis not present

## 2022-06-22 LAB — POTASSIUM: Potassium: 3.3 mmol/L — ABNORMAL LOW (ref 3.5–5.1)

## 2022-06-22 NOTE — Progress Notes (Signed)
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 Small bowel resection on 07/24/2019. 2. Right hemi-colectomy on 10/27/2019.  NGS Results: not done  CURRENT THERAPY: surveillance  CANCER STAGING:  Cancer Staging  Carcinoid tumor of small intestine Staging form: Small Intestine - Other Histologies, AJCC 8th Edition - Clinical stage from 08/10/2019: cT4, cN0, cM0 - Unsigned   INTERVAL HISTORY:  Hailey Randolph, a 72 y.o. female, seen for follow-up of neuroendocrine tumor of the small bowel.  Denies any worsening diarrhea, flushing or wheezing.  Denies any abdominal pains.  Chronic diarrhea since her surgery is stable.  REVIEW OF SYSTEMS:  Review of Systems  Constitutional:  Negative for appetite change, fatigue and unexpected weight change.  Respiratory:  Positive for shortness of breath (With exertion). Negative for wheezing.   Gastrointestinal:  Positive for diarrhea. Negative for abdominal pain.  Psychiatric/Behavioral:  Positive for sleep disturbance.   All other systems reviewed and are negative.   PAST MEDICAL/SURGICAL HISTORY:  Past Medical History:  Diagnosis Date   Adenomatous colon polyp    Carcinoid tumor of small intestine    Essential hypertension    Hyperlipidemia    Hypothyroidism    Insomnia    Osteoarthritis    Past Surgical History:  Procedure Laterality Date   ABDOMINAL HYSTERECTOMY     APPENDECTOMY     BIOPSY  08/14/2016   Procedure: BIOPSY;  Surgeon: Daneil Dolin, MD;  [facility];  Service: Endoscopy;;  gastric   BOWEL RESECTION N/A 07/24/2019   Procedure: PARTIAL SMALL BOWEL RESECTION;  Surgeon: Aviva Signs, MD;  Location: [facility];  Service: General;  Laterality: N/A;    BREAST EXCISIONAL BIOPSY Left 10/30/2016   BREAST EXCISIONAL BIOPSY Right 10/30/2016   COLON SURGERY     COLONOSCOPY  08/2005   Adenomatous polyp, pedunculated, at 30 cm. Scattered left-sided diverticula   COLONOSCOPY N/A 01/17/2016   Dr. Gala Romney: 5 mm polyp in the cecum, tubular adenoma. Scattered small and large mouth diverticula in the sigmoid colon. Next colonoscopy 5 years.   COLONOSCOPY WITH PROPOFOL N/A 11/04/2021   Procedure: COLONOSCOPY WITH PROPOFOL;  Surgeon: Aviva Signs, MD;  [facility];  Service: Gastroenterology;  Laterality: N/A;   ESOPHAGOGASTRODUODENOSCOPY N/A 08/14/2016   Procedure: ESOPHAGOGASTRODUODENOSCOPY (EGD);  Surgeon: Daneil Dolin, MD;  [facility];  Service: Endoscopy;  Laterality: N/A;  215   hysterectomy     complete   LAPAROTOMY N/A 07/24/2019   Procedure: EXPLORATORY LAPAROTOMY;  Surgeon: Aviva Signs, MD;  Location: [facility];  Service: General;  Laterality: N/A;   PARTIAL COLECTOMY N/A 10/27/2019   Procedure: RIGHT HEMI-COLECTOMY;  Surgeon: Aviva Signs, MD;  Location: [facility];  Service: General;  Laterality: N/A;   RADIOACTIVE SEED GUIDED EXCISIONAL BREAST BIOPSY Bilateral 11/02/2016   Procedure: BILATERAL RADIOACTIVE SEED GUIDED EXCISIONAL BREAST BIOPSY LEFT BREAST 2 SEEDS RIGHT BREAST 1 SEED;  Surgeon: Rolm Bookbinder, MD;  Location: [facility];  Service: General;  Laterality: Bilateral;   TONSILLECTOMY      SOCIAL HISTORY:  Social History   Socioeconomic History   Marital status: Widowed    Spouse name: Not on file   Number of children: 2   Years of education: Not on  file   Highest education level: Not on file  Occupational History   Occupation: retired    Comment: Research officer, trade union division  Tobacco Use   Smoking status: Never   Smokeless tobacco: Never  Vaping Use   Vaping Use: Never used  Substance and Sexual Activity   Alcohol use: No   Drug use: No   Sexual activity: Not Currently  Other  Topics Concern   Not on file  Social History Narrative   Not on file   Social Determinants of Health   Financial Resource Strain: Low Risk  (08/09/2019)   Overall Financial Resource Strain (CARDIA)    Difficulty of Paying Living Expenses: Not hard at all  Food Insecurity: No Food Insecurity (08/09/2019)   Hunger Vital Sign    Worried About Running Out of Food in the Last Year: Never true    [place] in the Last Year: Never true  Transportation Needs: No Transportation Needs (08/09/2019)   PRAPARE - Hydrologist (Medical): No    Lack of Transportation (Non-Medical): No  Physical Activity: Insufficiently Active (08/09/2019)   Exercise Vital Sign    Days of Exercise per Week: 3 days    Minutes of Exercise per Session: 30 min  Stress: No Stress Concern Present (08/09/2019)   [place]    Feeling of Stress : Only a little  Social Connections: Moderately Isolated (08/09/2019)   Social Connection and Isolation Panel [NHANES]    Frequency of Communication with Friends and Family: More than three times a week    Frequency of Social Gatherings with Friends and Family: More than three times a week    Attends Religious Services: More than 4 times per year    Active Member of [employer] or Organizations: No    Attends Archivist Meetings: Never    Marital Status: Widowed  Intimate Partner Violence: Not At Risk (08/09/2019)   Humiliation, Afraid, Rape, and Kick questionnaire    Fear of Current or Ex-Partner: No    Emotionally Abused: No    Physically Abused: No    Sexually Abused: No    FAMILY HISTORY:  Family History  Problem Relation Age of Onset   Cancer Mother    Breast cancer Mother 46   Colon polyps Mother    Hypertension Father    Heart attack Father 87       deceased   Arthritis Sister    Liver disease Neg Hx     CURRENT MEDICATIONS:  Current Outpatient Medications   Medication Sig Dispense Refill   acetaminophen (TYLENOL) 650 MG CR tablet Take 650-1,300 mg by mouth every 8 (eight) hours as needed for pain.     atorvastatin (LIPITOR) 20 MG tablet Take 20 mg by mouth at bedtime.     diphenhydrAMINE (BENADRYL) 25 MG tablet Take 50 mg by mouth daily as needed for allergies.     hydrochlorothiazide (HYDRODIURIL) 25 MG tablet Take 25 mg by mouth daily.     levothyroxine (SYNTHROID, LEVOTHROID) 75 MCG tablet Take 75 mcg by mouth daily before breakfast.     LORazepam (ATIVAN) 1 MG tablet Take 1 mg by mouth daily as needed for anxiety.     losartan (COZAAR) 100 MG tablet Take 100 mg by mouth daily.     Melatonin 5 MG CAPS Take 5 mg by mouth at bedtime as needed (sleep).     PAXLOVID, 300/100, 20 x 150 MG &  10 x '100MG'$  TBPK Take by mouth.     potassium chloride SA (KLOR-CON M) 20 MEQ tablet Take 20 mEq by mouth daily.     prochlorperazine (COMPAZINE) 10 MG tablet Take 1 tablet (10 mg total) by mouth every 6 (six) hours as needed for nausea or vomiting. 30 tablet 0   traMADol (ULTRAM) 50 MG tablet Take 50 mg by mouth every 6 (six) hours as needed for moderate pain.     zolpidem (AMBIEN) 10 MG tablet Take 5 mg by mouth at bedtime as needed for sleep.     No current facility-administered medications for this visit.    ALLERGIES:  No Known Allergies  PHYSICAL EXAM:  Performance status (ECOG): 1 - Symptomatic but completely ambulatory  Vitals:   06/22/22 1438  BP: (!) 131/92  Pulse: 78  Resp: 18  Temp: (!) 97.5 F (36.4 C)  SpO2: 98%   Wt Readings from Last 3 Encounters:  06/22/22 175 lb 1.6 oz (79.4 kg)  12/15/21 179 lb (81.2 kg)  10/23/21 182 lb (82.6 kg)   Physical Exam Vitals reviewed.  Constitutional:      Appearance: Normal appearance. She is obese.  Cardiovascular:     Rate and Rhythm: Normal rate and regular rhythm.     Pulses: Normal pulses.     Heart sounds: Normal heart sounds.  Pulmonary:     Effort: Pulmonary effort is normal.      Breath sounds: Normal breath sounds.  Abdominal:     Palpations: Abdomen is soft. There is no hepatomegaly, splenomegaly or mass.     Tenderness: There is no abdominal tenderness.  Neurological:     General: No focal deficit present.     Mental Status: She is alert and oriented to person, place, and time.  Psychiatric:        Mood and Affect: Mood normal.        Behavior: Behavior normal.      LABORATORY DATA:  I have reviewed the labs as listed.     Latest Ref Rng & Units 06/15/2022    9:08 AM 12/08/2021   11:08 AM 06/09/2021    9:29 AM  CBC  WBC 4.0 - 10.5 K/uL 9.5  7.3  9.6   Hemoglobin 12.0 - 15.0 g/dL 14.5  13.8  14.1   Hematocrit 36.0 - 46.0 % 42.4  40.4  42.7   Platelets 150 - 400 K/uL 229  253  283       Latest Ref Rng & Units 06/22/2022    2:25 PM 06/15/2022    9:08 AM 12/08/2021   11:08 AM  CMP  Glucose 70 - 99 mg/dL  91  94   BUN 8 - 23 mg/dL  10  11   Creatinine 0.44 - 1.00 mg/dL  0.95  0.83   Sodium 135 - 145 mmol/L  138  139   Potassium 3.5 - 5.1 mmol/L 3.3  2.8  3.5   Chloride 98 - 111 mmol/L  102  106   CO2 22 - 32 mmol/L  25  26   Calcium 8.9 - 10.3 mg/dL  9.0  8.5   Total Protein 6.5 - 8.1 g/dL  7.0  6.3   Total Bilirubin 0.3 - 1.2 mg/dL  0.8  0.9   Alkaline Phos 38 - 126 U/L  61  62   AST 15 - 41 U/L  22  21   ALT 0 - 44 U/L  17  22  DIAGNOSTIC IMAGING:  I have independently reviewed the scans and discussed with the patient. CT Abdomen Pelvis W Contrast  Result Date: 06/15/2022 CLINICAL DATA:  Small bowel cancer monitoring. Carcinoid of the small intestine. Gastric cancer. Right inguinal pain. History of prior colectomy * Tracking Code: BO * EXAM: CT ABDOMEN AND PELVIS WITH CONTRAST TECHNIQUE: Multidetector CT imaging of the abdomen and pelvis was performed using the standard protocol following bolus administration of intravenous contrast. RADIATION DOSE REDUCTION: This exam was performed according to the departmental dose-optimization program which  includes automated exposure control, adjustment of the mA and/or kV according to patient size and/or use of iterative reconstruction technique. CONTRAST:  150m OMNIPAQUE IOHEXOL 300 MG/ML  SOLN COMPARISON:  MRI 06/09/2021 and older.  CT 07/09/2020 FINDINGS: Lower chest: There is linear opacity lung bases likely scar or atelectasis. No pleural effusion. Mild breathing motion. Hepatobiliary: The previous hypervascular lesion identified in segment 5 is again seen today on series 2, image 32 measuring 10 by 7 mm and previously by MRI 8 mm but on the prior CT scan measured 10 x 9 mm. The segment 6 flash filling hemangioma on prior MRI is also stable today on series 2, image 37 but is more faint on the series and more prominent on the portal venous phase of series 7, image 42. Not significantly changed. No other hypervascular liver lesion identified. Patent portal vein. Gallbladder is nondilated with some folding towards the fundus. Pancreas: Global mild atrophy of the pancreas without hypervascular enhancement or space-occupying lesion. Spleen: Spleen is nonenlarged.  There is a splenule inferiorly. Adrenals/Urinary Tract: Adrenal glands are preserved. No enhancing renal mass or collecting system dilatation. There are some small parenchymal Bosniak 1 cysts, unchanged from previous. There is normal course and caliber dome of the bladder. Preserved contours of the urinary bladder. Stomach/Bowel: The large bowel has a normal caliber. Portions distally are collapsed. Scattered stool. Surgical changes from right hemicolectomy with primary anastomosis. There is also midline anterior abdominal hernia involving transverse colon without obstruction. Component of rectus muscle diastasis. Appearance is unchanged from prior when adjusting for technique. Small bowel is nondilated. Several scattered small bowel diverticula are identified. Question fold thickening along loops of jejunum, unchanged from prior Vascular/Lymphatic: Normal  caliber aorta and IVC with minimal atherosclerotic change. There are several small nodes in the right upper quadrant including Peri celiac and porta hepatis such as a portacaval node on series 7, image 37 measuring 13 x 8 mm today and on prior CT scan twelve by 9 mm, not significantly changed when adjusted for slice misregistration. Reproductive: Status post hysterectomy. No adnexal masses. Other: No developing ascites. Musculoskeletal: Slight curvature of the spine. Scattered degenerative changes. IMPRESSION: Stable surgical changes from right hemicolectomy and primary anastomosis. No bowel obstruction, free air or free fluid. Scattered small bowel diverticula. Focal midline anterior abdominal wall hernia involving transverse colon without obstruction. Two hypervascular liver lesions are stable from previous in segments 5 and 6. Recommend continued surveillance Electronically Signed   By: AJill SideM.D.   On: 06/15/2022 11:20     ASSESSMENT:  1.  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 -Exploratory laparotomy and resection of small bowel on 07/24/2019. -Pathology with grade 1 well-differentiated neuroendocrine tumor, 2 cm, unifocal, mitotic index 1/10 hpf, Ki-67 1%, tumor invades into the serosal surface, margins negative, no LVI, 2 tumor deposits measuring 0.4 cm and 0.5 cm, 0/18  lymph nodes positive, PT4PN0. -24-hour urine for 5-HIAA was negative.  Serum chromogranin was 54.7. -PET scan on 09/04/2019 showed single focus of radiotracer avid nodularity along the serosal surface of the cecum measuring 8 mm consistent with well-differentiated neuroendocrine tumor metastasis.  No evidence of residual tumor at the anastomosis.  No evidence of liver mets.  No evidence of distal disease. -Right hemicolectomy on 10/27/2019 which showed well-differentiated neuroendocrine tumor, 1 cm  involving the wall of the cecum, grade 1.  0/25 lymph nodes involved. - Colonoscopy (11/04/2021): Anastomotic site is within normal limits.   2.  Left breast usual ductal hyperplasia: -Lumpectomy of the left breast on 11/02/2016 showing UDH, fibrocystic changes.  No evidence of malignancy. -Family history of breast cancer in the mother at age 18.    PLAN:  1.  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 - Reviewed labs from 06/15/2022 which showed normal LFTs and CBC.  Chromogranin was normal. - CTAP (06/15/2022): Stable postsurgical changes with midline anterior abdominal wall hernia.  2 hypervascular liver lesions are stable from previous scans. - Recommend follow-up in 6 months with repeat chromogranin and LFTs.  Will plan on repeating imaging in 1 year.   2.  Left breast usual ductal hyperplasia: - Mammograms on 10/06/2021 and 10/09/2021 was BI-RADS Category 2.   3.  Hypokalemia: - Her potassium was 2.8.  We have increased her potassium for that day.  He she is currently taking 20 mEq of potassium daily.  Potassium today is 3.3.  She will follow-up with Dr. Willey Blade next month.   Orders placed this encounter:  Orders Placed This Encounter  Procedures   CBC with Differential/Platelet   Comprehensive metabolic panel   Chromogranin A      Derek Jack, MD [facility] [phone]

## 2022-06-22 NOTE — Patient Instructions (Signed)
[facility]  Discharge Instructions  You were seen and examined today by Dr. Delton Coombes.  Dr. Delton Coombes discussed your most recent lab work and CT scan which revealed everything looks stable.  Follow-up as scheduled in 6 months with labs.    Thank you for choosing [facility] to provide your oncology and hematology care.   To afford each patient quality time with our provider, please arrive at least 15 minutes before your scheduled appointment time. You may need to reschedule your appointment if you arrive late (10 or more minutes). Arriving late affects you and other patients whose appointments are after yours.  Also, if you miss three or more appointments without notifying the office, you may be dismissed from the clinic at the provider's discretion.    Again, thank you for choosing [facility].  Our hope is that these requests will decrease the amount of time that you wait before being seen by our physicians.   If you have a lab appointment with the [facility] please come in thru the Main Entrance and check in at the main information desk.           _____________________________________________________________  Should you have questions after your visit to [facility], please contact our office at [phone] and follow the prompts.  Our office hours are 8:00 a.m. to 4:30 p.m. Monday - Thursday and 8:00 a.m. to 2:30 p.m. Friday.  Please note that voicemails left after 4:00 p.m. may not be returned until the following business day.  We are closed weekends and all major holidays.  You do have access to a nurse 24-7, just call the main number to the clinic [phone] and do not press any options, hold on the line and a nurse will answer the phone.    For prescription refill requests, have your pharmacy contact our office and allow 72 hours.    Masks are optional in the cancer centers. If you would like  for your care team to wear a mask while they are taking care of you, please let them know. You may have one support person who is at least 73 years old accompany you for your appointments.

## 2022-07-20 DIAGNOSIS — M1990 Unspecified osteoarthritis, unspecified site: Secondary | ICD-10-CM | POA: Diagnosis not present

## 2022-07-20 DIAGNOSIS — C7A019 Malignant carcinoid tumor of the small intestine, unspecified portion: Secondary | ICD-10-CM | POA: Diagnosis not present

## 2022-07-20 DIAGNOSIS — F419 Anxiety disorder, unspecified: Secondary | ICD-10-CM | POA: Diagnosis not present

## 2022-07-20 DIAGNOSIS — G4700 Insomnia, unspecified: Secondary | ICD-10-CM | POA: Diagnosis not present

## 2022-07-20 DIAGNOSIS — E876 Hypokalemia: Secondary | ICD-10-CM | POA: Diagnosis not present

## 2022-07-20 DIAGNOSIS — E039 Hypothyroidism, unspecified: Secondary | ICD-10-CM | POA: Diagnosis not present

## 2022-07-20 DIAGNOSIS — Z79899 Other long term (current) drug therapy: Secondary | ICD-10-CM | POA: Diagnosis not present

## 2022-07-20 DIAGNOSIS — I10 Essential (primary) hypertension: Secondary | ICD-10-CM | POA: Diagnosis not present

## 2022-07-20 DIAGNOSIS — E785 Hyperlipidemia, unspecified: Secondary | ICD-10-CM | POA: Diagnosis not present

## 2022-07-27 DIAGNOSIS — I10 Essential (primary) hypertension: Secondary | ICD-10-CM | POA: Diagnosis not present

## 2022-07-27 DIAGNOSIS — E039 Hypothyroidism, unspecified: Secondary | ICD-10-CM | POA: Diagnosis not present

## 2022-07-27 DIAGNOSIS — Z0000 Encounter for general adult medical examination without abnormal findings: Secondary | ICD-10-CM | POA: Diagnosis not present

## 2022-07-27 DIAGNOSIS — C7A8 Other malignant neuroendocrine tumors: Secondary | ICD-10-CM | POA: Diagnosis not present

## 2022-07-27 DIAGNOSIS — E785 Hyperlipidemia, unspecified: Secondary | ICD-10-CM | POA: Diagnosis not present

## 2022-10-02 ENCOUNTER — Other Ambulatory Visit (HOSPITAL_COMMUNITY): Payer: Self-pay | Admitting: Internal Medicine

## 2022-10-02 DIAGNOSIS — Z1231 Encounter for screening mammogram for malignant neoplasm of breast: Secondary | ICD-10-CM

## 2022-10-12 ENCOUNTER — Inpatient Hospital Stay (HOSPITAL_COMMUNITY): Admission: RE | Admit: 2022-10-12 | Payer: Medicare Other | Source: Ambulatory Visit

## 2022-10-22 ENCOUNTER — Ambulatory Visit (HOSPITAL_COMMUNITY)
Admission: RE | Admit: 2022-10-22 | Discharge: 2022-10-22 | Disposition: A | Discharge status: Home / Self Care | Payer: Medicare Other | Source: Ambulatory Visit | Attending: Internal Medicine | Admitting: Internal Medicine

## 2022-10-22 DIAGNOSIS — Z1231 Encounter for screening mammogram for malignant neoplasm of breast: Secondary | ICD-10-CM | POA: Insufficient documentation

## 2022-12-20 NOTE — Progress Notes (Signed)
[facility] [address], [place] [address]    Clinic Day:  12/21/22   Referring physician: Carylon Perches, MD  Patient Care Team: Carylon Perches, MD as PCP - General (Internal Medicine) Jonelle Sidle, MD as PCP - Cardiology (Cardiology) Jena Gauss Gerrit Friends, MD (Gastroenterology) Mickie Bail, RN as Oncology Nurse Navigator (Oncology) Doreatha Massed, MD as Medical Oncologist (Oncology)   ASSESSMENT & PLAN:   Assessment:  1.  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 -Exploratory laparotomy and resection of small bowel on 07/24/2019. -Pathology with grade 1 well-differentiated neuroendocrine tumor, 2 cm, unifocal, mitotic index 1/10 hpf, Ki-67 1%, tumor invades into the serosal surface, margins negative, no LVI, 2 tumor deposits measuring 0.4 cm and 0.5 cm, 0/18 lymph nodes positive, PT4PN0. -24-hour urine for 5-HIAA was negative.  Serum chromogranin was 54.7. -PET scan on 09/04/2019 showed single focus of radiotracer avid nodularity along the serosal surface of the cecum measuring 8 mm consistent with well-differentiated neuroendocrine tumor metastasis.  No evidence of residual tumor at the anastomosis.  No evidence of liver mets.  No evidence of distal disease. -Right hemicolectomy on 10/27/2019 which showed well-differentiated neuroendocrine tumor, 1 cm involving the wall of the cecum, grade 1.  0/25 lymph nodes involved. - Colonoscopy (11/04/2021): Anastomotic site is within normal limits.   2.  Left breast usual ductal hyperplasia: -Lumpectomy of the left breast on 11/02/2016 showing UDH, fibrocystic changes.  No evidence of malignancy. -Family history of breast cancer in the mother at age 44.  Plan:  1.  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 - Labs from today: Normal LFTs.  CBC grossly normal.  Chromogranin level is pending.  Last chromogranin was 88. - Recommend RTC in 6 months with repeat CTAP with contrast and chromogranin levels.   2.  Left breast usual ductal hyperplasia: - Mammogram on 10/22/2022: BI-RADS Category 1.   3.  Hypokalemia: - She is on HCTZ 25 mg daily. - Continue K-Dur 20 mEq daily.  Potassium today is 3.2.  Orders Placed This Encounter  Procedures   CT ABDOMEN PELVIS W CONTRAST    Standing Status:   Future    Standing Expiration Date:   12/21/2023    Order Specific Question:   If indicated for the ordered procedure, I authorize the administration of contrast media per Radiology protocol    Answer:   Yes    Order Specific Question:   Does the patient have a contrast media/X-ray dye allergy?    Answer:   No    Order Specific Question:   Preferred imaging location?    Answer:   [facility]    Order Specific Question:   If indicated for the ordered procedure, I authorize the administration of oral contrast media per Radiology protocol    Answer:   Yes   Chromogranin A    Standing Status:   Future    Standing Expiration Date:   12/21/2023   CBC with Differential    Standing Status:   Future    Standing Expiration Date:   12/21/2023   Comprehensive metabolic panel    Standing Status:   Future    Standing Expiration Date:   12/21/2023      Alben Deeds Teague,acting as a scribe for Doreatha Massed, MD.,have documented all relevant  documentation on the behalf of Doreatha Massed, MD,as directed by  Doreatha Massed, MD while in the presence of Doreatha Massed, MD.   I, Doreatha Massed MD, have reviewed the above documentation for accuracy and completeness, and I agree with the above.   Doreatha Massed, MD   7/29/20245:27 PM  CHIEF COMPLAINT:   Diagnosis: Malignant carcinoid tumor of ileum    Cancer Staging  Carcinoid tumor of small intestine Staging form:  Small Intestine - Other Histologies, AJCC 8th Edition - Clinical stage from 08/10/2019: cT4, cN0, cM0 - Unsigned    Prior Therapy: 1. Small bowel resection on 07/24/2019. 2. Right hemi-colectomy on 10/27/2019.  Current Therapy:  surveillance   HISTORY OF PRESENT ILLNESS:   Oncology History   No history exists.     INTERVAL HISTORY:   Hailey Randolph is a 72 y.o. female presenting to clinic today for follow up of well-differentiated neuroendocrine tumor of small intestine. She was last seen by me on 06/22/22.  Today, she states that she is doing well overall. Her appetite level is at 100%. Her energy level is at 75%.  She reports a normal appetite. She currently takes potassium daily, though not when she had Covid. She has been on a fluid pill for years. She notes she has not been drinking as much water as she should be. She denies any ankle swelling, diarrhea, abdominal cramping, or flushing.   PAST MEDICAL HISTORY:   Past Medical History: Past Medical History:  Diagnosis Date   Adenomatous colon polyp    Carcinoid tumor of small intestine    Essential hypertension    Hyperlipidemia    Hypothyroidism    Insomnia    Osteoarthritis     Surgical History: Past Surgical History:  Procedure Laterality Date   ABDOMINAL HYSTERECTOMY     APPENDECTOMY     BIOPSY  08/14/2016   Procedure: BIOPSY;  Surgeon: Corbin Ade, MD;  [facility];  Service: Endoscopy;;  gastric   BOWEL RESECTION N/A 07/24/2019   Procedure: PARTIAL SMALL BOWEL RESECTION;  Surgeon: Franky Macho, MD;  Location: [facility];  Service: General;  Laterality: N/A;   BREAST EXCISIONAL BIOPSY Left 10/30/2016   BREAST EXCISIONAL BIOPSY Right 10/30/2016   COLON SURGERY     COLONOSCOPY  08/2005   Adenomatous polyp, pedunculated, at 30 cm. Scattered left-sided diverticula   COLONOSCOPY N/A 01/17/2016   Dr. Jena Gauss: 5 mm polyp in the cecum, tubular adenoma. Scattered small and large mouth diverticula in the sigmoid  colon. Next colonoscopy 5 years.   COLONOSCOPY WITH PROPOFOL N/A 11/04/2021   Procedure: COLONOSCOPY WITH PROPOFOL;  Surgeon: Franky Macho, MD;  [facility]: [facility];  Service: Gastroenterology;  Laterality: N/A;   ESOPHAGOGASTRODUODENOSCOPY N/A 08/14/2016   Procedure: ESOPHAGOGASTRODUODENOSCOPY (EGD);  Surgeon: Corbin Ade, MD;  [facility]: [facility];  Service: Endoscopy;  Laterality: N/A;  215   hysterectomy     complete   LAPAROTOMY N/A 07/24/2019   Procedure: EXPLORATORY LAPAROTOMY;  Surgeon: Franky Macho, MD;  Location: [facility];  Service: General;  Laterality: N/A;   PARTIAL COLECTOMY N/A 10/27/2019   Procedure: RIGHT HEMI-COLECTOMY;  Surgeon: Franky Macho, MD;  Location: [facility];  Service: General;  Laterality: N/A;   RADIOACTIVE SEED GUIDED EXCISIONAL BREAST BIOPSY Bilateral 11/02/2016   Procedure: BILATERAL RADIOACTIVE SEED GUIDED EXCISIONAL BREAST BIOPSY LEFT BREAST 2 SEEDS RIGHT BREAST 1 SEED;  Surgeon: Emelia Loron, MD;  Location: [facility];  Service: General;  Laterality: Bilateral;   TONSILLECTOMY  Social History: Social History   Socioeconomic History   Marital status: Widowed    Spouse name: Not on file   Number of children: 2   Years of education: Not on file   Highest education level: Not on file  Occupational History   Occupation: retired    Comment: Public relations account executive division  Tobacco Use   Smoking status: Never   Smokeless tobacco: Never  Vaping Use   Vaping status: Never Used  Substance and Sexual Activity   Alcohol use: No   Drug use: No   Sexual activity: Not Currently  Other Topics Concern   Not on file  Social History Narrative   Not on file   Social Determinants of Health   Financial Resource Strain: Low Risk  (08/09/2019)   Overall Financial Resource Strain (CARDIA)    Difficulty of Paying Living Expenses: Not hard at all  Food Insecurity: No Food Insecurity (08/09/2019)   Hunger Vital Sign    Worried About Running  Out of Food in the Last Year: Never true    Ran Out of Food in the Last Year: Never true  Transportation Needs: No Transportation Needs (08/09/2019)   PRAPARE - Administrator, Civil Service (Medical): No    Lack of Transportation (Non-Medical): No  Physical Activity: Insufficiently Active (08/09/2019)   Exercise Vital Sign    Days of Exercise per Week: 3 days    Minutes of Exercise per Session: 30 min  Stress: No Stress Concern Present (08/09/2019)   [employer] of Occupational Health - Occupational Stress Questionnaire    Feeling of Stress : Only a little  Social Connections: Moderately Isolated (08/09/2019)   Social Connection and Isolation Panel [NHANES]    Frequency of Communication with Friends and Family: More than three times a week    Frequency of Social Gatherings with Friends and Family: More than three times a week    Attends Religious Services: More than 4 times per year    Active Member of [employer] or Organizations: No    Attends Banker Meetings: Never    Marital Status: Widowed  Intimate Partner Violence: Not At Risk (08/09/2019)   Humiliation, Afraid, Rape, and Kick questionnaire    Fear of Current or Ex-Partner: No    Emotionally Abused: No    Physically Abused: No    Sexually Abused: No    Family History: Family History  Problem Relation Age of Onset   Cancer Mother    Breast cancer Mother 59   Colon polyps Mother    Hypertension Father    Heart attack Father 52       deceased   Arthritis Sister    Liver disease Neg Hx     Current Medications:  Current Outpatient Medications:    acetaminophen (TYLENOL) 650 MG CR tablet, Take 650-1,300 mg by mouth every 8 (eight) hours as needed for pain., Disp: , Rfl:    atorvastatin (LIPITOR) 20 MG tablet, Take 20 mg by mouth at bedtime., Disp: , Rfl:    diphenhydrAMINE (BENADRYL) 25 MG tablet, Take 50 mg by mouth daily as needed for allergies., Disp: , Rfl:    hydrochlorothiazide (HYDRODIURIL)  25 MG tablet, Take 25 mg by mouth daily., Disp: , Rfl:    levothyroxine (SYNTHROID, LEVOTHROID) 75 MCG tablet, Take 75 mcg by mouth daily before breakfast., Disp: , Rfl:    LORazepam (ATIVAN) 1 MG tablet, Take 1 mg by mouth daily as needed for anxiety., Disp: , Rfl:  losartan (COZAAR) 100 MG tablet, Take 100 mg by mouth daily., Disp: , Rfl:    Melatonin 5 MG CAPS, Take 5 mg by mouth at bedtime as needed (sleep)., Disp: , Rfl:    PAXLOVID, 300/100, 20 x 150 MG & 10 x 100MG  TBPK, Take by mouth., Disp: , Rfl:    potassium chloride SA (KLOR-CON M) 20 MEQ tablet, Take 20 mEq by mouth daily., Disp: , Rfl:    prochlorperazine (COMPAZINE) 10 MG tablet, Take 1 tablet (10 mg total) by mouth every 6 (six) hours as needed for nausea or vomiting., Disp: 30 tablet, Rfl: 0   traMADol (ULTRAM) 50 MG tablet, Take 50 mg by mouth every 6 (six) hours as needed for moderate pain., Disp: , Rfl:    zolpidem (AMBIEN) 10 MG tablet, Take 5 mg by mouth at bedtime as needed for sleep., Disp: , Rfl:    Allergies: No Known Allergies  REVIEW OF SYSTEMS:   Review of Systems  Constitutional:  Negative for chills, fatigue and fever.  HENT:   Negative for lump/mass, mouth sores, nosebleeds, sore throat and trouble swallowing.   Eyes:  Negative for eye problems.  Respiratory:  Positive for shortness of breath (upon exertion). Negative for cough.   Cardiovascular:  Negative for chest pain, leg swelling and palpitations.  Gastrointestinal:  Negative for abdominal pain, constipation, diarrhea, nausea and vomiting.  Genitourinary:  Negative for bladder incontinence, difficulty urinating, dysuria, frequency, hematuria and nocturia.   Musculoskeletal:  Negative for arthralgias, back pain, flank pain, myalgias and neck pain.  Skin:  Negative for itching and rash.  Neurological:  Negative for dizziness, headaches and numbness.  Hematological:  Does not bruise/bleed easily.  Psychiatric/Behavioral:  Negative for depression, sleep  disturbance and suicidal ideas. The patient is not nervous/anxious.   All other systems reviewed and are negative.    VITALS:   Blood pressure 125/81, pulse 71, temperature 98.2 F (36.8 C), temperature source Tympanic, resp. rate 18, weight 175 lb 12.8 oz (79.7 kg), SpO2 97%.  Wt Readings from Last 3 Encounters:  12/21/22 175 lb 12.8 oz (79.7 kg)  06/22/22 175 lb 1.6 oz (79.4 kg)  12/15/21 179 lb (81.2 kg)    Body mass index is 32.15 kg/m.  Performance status (ECOG): 1 - Symptomatic but completely ambulatory  PHYSICAL EXAM:   Physical Exam Vitals and nursing note reviewed. Exam conducted with a chaperone present.  Constitutional:      Appearance: Normal appearance.  Cardiovascular:     Rate and Rhythm: Normal rate and regular rhythm.     Pulses: Normal pulses.     Heart sounds: Normal heart sounds.  Pulmonary:     Effort: Pulmonary effort is normal.     Breath sounds: Normal breath sounds.  Abdominal:     Palpations: Abdomen is soft. There is no hepatomegaly, splenomegaly or mass.     Tenderness: There is no abdominal tenderness.  Musculoskeletal:     Right lower leg: No edema.     Left lower leg: No edema.  Lymphadenopathy:     Cervical: No cervical adenopathy.     Right cervical: No superficial, deep or posterior cervical adenopathy.    Left cervical: No superficial, deep or posterior cervical adenopathy.     Upper Body:     Right upper body: No supraclavicular or axillary adenopathy.     Left upper body: No supraclavicular or axillary adenopathy.  Neurological:     General: No focal deficit present.     Mental  Status: She is alert and oriented to person, place, and time.  Psychiatric:        Mood and Affect: Mood normal.        Behavior: Behavior normal.     LABS:      Latest Ref Rng & Units 12/21/2022    1:08 PM 06/15/2022    9:08 AM 12/08/2021   11:08 AM  CBC  WBC 4.0 - 10.5 K/uL 10.9  9.5  7.3   Hemoglobin 12.0 - 15.0 g/dL 16.1  09.6  04.5    Hematocrit 36.0 - 46.0 % 40.4  42.4  40.4   Platelets 150 - 400 K/uL 277  229  253       Latest Ref Rng & Units 12/21/2022    1:08 PM 06/22/2022    2:25 PM 06/15/2022    9:08 AM  CMP  Glucose 70 - 99 mg/dL 409   91   BUN 8 - 23 mg/dL 9   10   Creatinine 8.11 - 1.00 mg/dL 9.14   7.82   Sodium 956 - 145 mmol/L 137   138   Potassium 3.5 - 5.1 mmol/L 3.2  3.3  2.8   Chloride 98 - 111 mmol/L 107   102   CO2 22 - 32 mmol/L 22   25   Calcium 8.9 - 10.3 mg/dL 8.4   9.0   Total Protein 6.5 - 8.1 g/dL 6.6   7.0   Total Bilirubin 0.3 - 1.2 mg/dL 1.2   0.8   Alkaline Phos 38 - 126 U/L 61   61   AST 15 - 41 U/L 18   22   ALT 0 - 44 U/L 20   17      Lab Results  Component Value Date   CEA1 1.9 07/21/2019   /  CEA  Date Value Ref Range Status  07/21/2019 1.9 0.0 - 4.7 ng/mL Final    Comment:    (NOTE)                             Nonsmokers          <3.9                             Smokers             <5.6 Roche [employer]ostics Electrochemiluminescence Immunoassay (ECLIA) Values obtained with different assay methods or kits cannot be used interchangeably.  Results cannot be interpreted as absolute evidence of the presence or absence of malignant disease. A duplicate report has been generated due to demographic updates. Performed At: [facility] [address] [place], [place] [id number] Jolene Schimke MD IO:[id number]    No results found for: "PSA1" No results found for: "[id number]" No results found for: "CAN125"  No results found for: "TOTALPROTELP", "ALBUMINELP", "A1GS", "A2GS", "BETS", "BETA2SER", "GAMS", "MSPIKE", "SPEI" No results found for: "TIBC", "FERRITIN", "IRONPCTSAT" No results found for: "LDH"   STUDIES:   No results found.

## 2022-12-21 ENCOUNTER — Inpatient Hospital Stay: Payer: Medicare Other

## 2022-12-21 ENCOUNTER — Inpatient Hospital Stay: Payer: Medicare Other | Attending: Hematology | Admitting: Hematology

## 2022-12-21 VITALS — BP 125/81 | HR 71 | Temp 98.2°F | Resp 18 | Wt 175.8 lb

## 2022-12-21 DIAGNOSIS — C7A012 Malignant carcinoid tumor of the ileum: Secondary | ICD-10-CM

## 2022-12-21 LAB — COMPREHENSIVE METABOLIC PANEL
ALT: 20 U/L (ref 0–44)
AST: 18 U/L (ref 15–41)
Albumin: 3.5 g/dL (ref 3.5–5.0)
Alkaline Phosphatase: 61 U/L (ref 38–126)
Anion gap: 8 (ref 5–15)
BUN: 9 mg/dL (ref 8–23)
CO2: 22 mmol/L (ref 22–32)
Calcium: 8.4 mg/dL — ABNORMAL LOW (ref 8.9–10.3)
Chloride: 107 mmol/L (ref 98–111)
Creatinine, Ser: 1.02 mg/dL — ABNORMAL HIGH (ref 0.44–1.00)
GFR, Estimated: 58 mL/min — ABNORMAL LOW (ref >60)
Glucose, Bld: 102 mg/dL — ABNORMAL HIGH (ref 70–99)
Potassium: 3.2 mmol/L — ABNORMAL LOW (ref 3.5–5.1)
Sodium: 137 mmol/L (ref 135–145)
Total Bilirubin: 1.2 mg/dL (ref 0.3–1.2)
Total Protein: 6.6 g/dL (ref 6.5–8.1)

## 2022-12-21 LAB — CBC WITH DIFFERENTIAL/PLATELET
Abs Immature Granulocytes: 0.04 10*3/uL (ref 0.00–0.07)
Basophils Absolute: 0.1 10*3/uL (ref 0.0–0.1)
Basophils Relative: 1 %
Eosinophils Absolute: 0.1 10*3/uL (ref 0.0–0.5)
Eosinophils Relative: 1 %
HCT: 40.4 % (ref 36.0–46.0)
Hemoglobin: 14.1 g/dL (ref 12.0–15.0)
Immature Granulocytes: 0 %
Lymphocytes Relative: 24 %
Lymphs Abs: 2.6 10*3/uL (ref 0.7–4.0)
MCH: 36.2 pg — ABNORMAL HIGH (ref 26.0–34.0)
MCHC: 34.9 g/dL (ref 30.0–36.0)
MCV: 103.9 fL — ABNORMAL HIGH (ref 80.0–100.0)
Monocytes Absolute: 0.7 10*3/uL (ref 0.1–1.0)
Monocytes Relative: 6 %
Neutro Abs: 7.3 10*3/uL (ref 1.7–7.7)
Neutrophils Relative %: 68 %
Platelets: 277 10*3/uL (ref 150–400)
RBC: 3.89 MIL/uL (ref 3.87–5.11)
RDW: 12.2 % (ref 11.5–15.5)
WBC: 10.9 10*3/uL — ABNORMAL HIGH (ref 4.0–10.5)
nRBC: 0 % (ref 0.0–0.2)

## 2022-12-21 NOTE — Patient Instructions (Signed)
[facility] at [facility] Discharge Instructions   You were seen and examined today by Dr. Ellin Saba.  He reviewed the results of your lab work which are normal/stable. Your chromagranin A (tumor marker) results are pending. It should be back in 1-2 days.   We will see you back in . We will repeat lab work and a scan prior to your next visit.   Return as scheduled.    Thank you for choosing [facility] at [facility] to provide your oncology and hematology care.  To afford each patient quality time with our provider, please arrive at least 15 minutes before your scheduled appointment time.   If you have a lab appointment with the Cancer Center please come in thru the Main Entrance and check in at the main information desk.  You need to re-schedule your appointment should you arrive 10 or more minutes late.  We strive to give you quality time with our providers, and arriving late affects you and other patients whose appointments are after yours.  Also, if you no show three or more times for appointments you may be dismissed from the clinic at the providers discretion.     Again, thank you for choosing [facility].  Our hope is that these requests will decrease the amount of time that you wait before being seen by our physicians.       _____________________________________________________________  Should you have questions after your visit to [facility], please contact our office at [phone] and follow the prompts.  Our office hours are 8:00 a.m. and 4:30 p.m. Monday - Friday.  Please note that voicemails left after 4:00 p.m. may not be returned until the following business day.  We are closed weekends and major holidays.  You do have access to a nurse 24-7, just call the main number to the clinic [phone] and do not press any options, hold on the line and a nurse will answer the phone.    For prescription refill  requests, have your pharmacy contact our office and allow 72 hours.    Due to Covid, you will need to wear a mask upon entering the hospital. If you do not have a mask, a mask will be given to you at the Main Entrance upon arrival. For doctor visits, patients may have 1 support person age 53 or older with them. For treatment visits, patients can not have anyone with them due to social distancing guidelines and our immunocompromised population.

## 2023-01-15 DIAGNOSIS — E039 Hypothyroidism, unspecified: Secondary | ICD-10-CM | POA: Diagnosis not present

## 2023-01-15 DIAGNOSIS — Z79899 Other long term (current) drug therapy: Secondary | ICD-10-CM | POA: Diagnosis not present

## 2023-01-15 DIAGNOSIS — R5383 Other fatigue: Secondary | ICD-10-CM | POA: Diagnosis not present

## 2023-01-15 DIAGNOSIS — R5381 Other malaise: Secondary | ICD-10-CM | POA: Diagnosis not present

## 2023-01-16 LAB — LAB REPORT - SCANNED: EGFR: 67

## 2023-01-22 ENCOUNTER — Other Ambulatory Visit: Payer: Self-pay

## 2023-01-22 DIAGNOSIS — E039 Hypothyroidism, unspecified: Secondary | ICD-10-CM | POA: Diagnosis not present

## 2023-01-22 DIAGNOSIS — C7A012 Malignant carcinoid tumor of the ileum: Secondary | ICD-10-CM

## 2023-01-22 DIAGNOSIS — R5383 Other fatigue: Secondary | ICD-10-CM | POA: Diagnosis not present

## 2023-01-22 DIAGNOSIS — C7A019 Malignant carcinoid tumor of the small intestine, unspecified portion: Secondary | ICD-10-CM | POA: Diagnosis not present

## 2023-01-22 NOTE — Progress Notes (Signed)
Message received from Doreatha Massed, MD- Dr. Ouida Sills has reached out to me about this patient of ours. She is having some episodes of flushing and right upper quadrant discomfort. Please have CT [facility] with contrast ordered and scheduled. Please have her follow-up with me after the scan.   Order for scan placed per Dr. Marice Potter orders. Appointments to be scheduled by schedulers.

## 2023-02-15 ENCOUNTER — Ambulatory Visit (HOSPITAL_COMMUNITY)
Admission: RE | Admit: 2023-02-15 | Discharge: 2023-02-15 | Disposition: A | Discharge status: Home / Self Care | Payer: Medicare Other | Source: Ambulatory Visit | Attending: Hematology | Admitting: Hematology

## 2023-02-15 DIAGNOSIS — K769 Liver disease, unspecified: Secondary | ICD-10-CM | POA: Diagnosis not present

## 2023-02-15 DIAGNOSIS — C7A012 Malignant carcinoid tumor of the ileum: Secondary | ICD-10-CM | POA: Insufficient documentation

## 2023-02-15 DIAGNOSIS — K5750 Diverticulosis of both small and large intestine without perforation or abscess without bleeding: Secondary | ICD-10-CM | POA: Diagnosis not present

## 2023-02-15 DIAGNOSIS — C7A8 Other malignant neuroendocrine tumors: Secondary | ICD-10-CM | POA: Diagnosis not present

## 2023-02-15 DIAGNOSIS — N281 Cyst of kidney, acquired: Secondary | ICD-10-CM | POA: Diagnosis not present

## 2023-02-15 MED ORDER — IOHEXOL 300 MG/ML  SOLN
100.0000 mL | Freq: Once | INTRAMUSCULAR | Status: AC | PRN
  Administered 2023-02-15: 100 mL via INTRAVENOUS

## 2023-02-21 NOTE — Progress Notes (Signed)
[facility] [address], [place] [address]    Clinic Day:  02/22/2023  Referring physician: Carylon Perches, MD  Patient Care Team: Carylon Perches, MD as PCP - General (Internal Medicine) Jonelle Sidle, MD as PCP - Cardiology (Cardiology) Jena Gauss Gerrit Friends, MD (Gastroenterology) Mickie Bail, RN as Oncology Nurse Navigator (Oncology) Doreatha Massed, MD as Medical Oncologist (Oncology)   ASSESSMENT & PLAN:   Assessment: 1.  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 -Exploratory laparotomy and resection of small bowel on 07/24/2019. -Pathology with grade 1 well-differentiated neuroendocrine tumor, 2 cm, unifocal, mitotic index 1/10 hpf, Ki-67 1%, tumor invades into the serosal surface, margins negative, no LVI, 2 tumor deposits measuring 0.4 cm and 0.5 cm, 0/18 lymph nodes positive, PT4PN0. -24-hour urine for 5-HIAA was negative.  Serum chromogranin was 54.7. -PET scan on 09/04/2019 showed single focus of radiotracer avid nodularity along the serosal surface of the cecum measuring 8 mm consistent with well-differentiated neuroendocrine tumor metastasis.  No evidence of residual tumor at the anastomosis.  No evidence of liver mets.  No evidence of distal disease. -Right hemicolectomy on 10/27/2019 which showed well-differentiated neuroendocrine tumor, 1 cm involving the wall of the cecum, grade 1.  0/25 lymph nodes involved. - Colonoscopy (11/04/2021): Anastomotic site is within normal limits.   2.  Left breast usual ductal hyperplasia: -Lumpectomy of the left breast on 11/02/2016 showing UDH, fibrocystic changes.  No evidence of malignancy. -Family history of breast cancer in the mother at age 27.    Plan: 1.  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 - She reported  worsening of bowel movements with soft stools after each meal.  Previously she had 1 soft bowel movement daily.  Now it happens after each meal, up to 3-4 times per day.  This has gotten worse in the last few weeks.  Tried Imodium which did not help. - She was evaluated by Dr. Ouida Sills who has decreased her Synthroid to 50 mcg daily on 01/22/2023.  Previously was taking 75 mcg daily. - Chromogranin on 12/21/2022 has increased to 107.1. - Will repeat chromogranin level today.  Will send urine 24-hour for 5-HIAA. - Reviewed CTAP from 02/22/2023: Stable exam with no new or progressive findings.  Stable tiny hypervascular liver lesions.  Periumbilical hernia. - Due to her worsening diarrhea, recommend further workup with dotatate PET scan. - RTC after PET scan.   2.  Left breast usual ductal hyperplasia: - Mammogram on 10/22/2022: BI-RADS Category 1.   3.  Hypokalemia: - Continue K-Dur 20 mill equivalents daily.  Potassium is normal.    Orders Placed This Encounter  Procedures   NM PET DOTATATE SKULL BASE TO MID THIGH    Standing Status:   Future    Standing Expiration Date:   02/22/2024    Order Specific Question:   If indicated for the ordered procedure, I authorize the administration of a radiopharmaceutical per Radiology protocol    Answer:   Yes    Order Specific Question:   Preferred imaging location?    Answer:   [facility]   Chromogranin A    Standing Status:   Future    Number of Occurrences:   1    Standing Expiration Date:   02/22/2024   5 HIAA, quantitative, urine, 24 hour    Standing Status:  Future    Standing Expiration Date:   02/22/2024      I,Katie Daubenspeck,acting as a scribe for Doreatha Massed, MD.,have documented all relevant documentation on the behalf of Doreatha Massed, MD,as directed by  Doreatha Massed, MD while in the presence of Doreatha Massed, MD.   I, Doreatha Massed MD, have reviewed the above documentation for accuracy and completeness,  and I agree with the above.   Doreatha Massed, MD   9/30/20245:04 PM  CHIEF COMPLAINT:   Diagnosis: Malignant carcinoid tumor of ileum    Cancer Staging  Carcinoid tumor of small intestine Staging form: Small Intestine - Other Histologies, AJCC 8th Edition - Clinical stage from 08/10/2019: cT4, cN0, cM0 - Unsigned    Prior Therapy: 1. Small bowel resection on 07/24/2019. 2. Right hemi-colectomy on 10/27/2019.  Current Therapy:  surveillance    HISTORY OF PRESENT ILLNESS:   Oncology History   No history exists.     INTERVAL HISTORY:   Hailey Randolph is a 72 y.o. female presenting to clinic today for follow up of Malignant carcinoid tumor of ileum. She was last seen by me on 12/21/22.  Since her last visit, she presented to her PCP with episodes of flushing and RUQ discomfort. She underwent CT A/P on 02/15/23.   Today, she states that she is doing well overall. Her appetite level is at 90%. Her energy level is at 75%.  PAST MEDICAL HISTORY:   Past Medical History: Past Medical History:  Diagnosis Date   Adenomatous colon polyp    Carcinoid tumor of small intestine    Essential hypertension    Hyperlipidemia    Hypothyroidism    Insomnia    Osteoarthritis     Surgical History: Past Surgical History:  Procedure Laterality Date   ABDOMINAL HYSTERECTOMY     APPENDECTOMY     BIOPSY  08/14/2016   Procedure: BIOPSY;  Surgeon: Corbin Ade, MD;  [facility]: [facility];  Service: Endoscopy;;  gastric   BOWEL RESECTION N/A 07/24/2019   Procedure: PARTIAL SMALL BOWEL RESECTION;  Surgeon: Franky Macho, MD;  Location: [facility];  Service: General;  Laterality: N/A;   BREAST EXCISIONAL BIOPSY Left 10/30/2016   BREAST EXCISIONAL BIOPSY Right 10/30/2016   COLON SURGERY     COLONOSCOPY  08/2005   Adenomatous polyp, pedunculated, at 30 cm. Scattered left-sided diverticula   COLONOSCOPY N/A 01/17/2016   Dr. Jena Gauss: 5 mm polyp in the cecum, tubular adenoma. Scattered small and  large mouth diverticula in the sigmoid colon. Next colonoscopy 5 years.   COLONOSCOPY WITH PROPOFOL N/A 11/04/2021   Procedure: COLONOSCOPY WITH PROPOFOL;  Surgeon: Franky Macho, MD;  Location: [facility];  Service: Gastroenterology;  Laterality: N/A;   ESOPHAGOGASTRODUODENOSCOPY N/A 08/14/2016   Procedure: ESOPHAGOGASTRODUODENOSCOPY (EGD);  Surgeon: Corbin Ade, MD;  Location: [facility];  Service: Endoscopy;  Laterality: N/A;  215   hysterectomy     complete   LAPAROTOMY N/A 07/24/2019   Procedure: EXPLORATORY LAPAROTOMY;  Surgeon: Franky Macho, MD;  Location: [facility];  Service: General;  Laterality: N/A;   PARTIAL COLECTOMY N/A 10/27/2019   Procedure: RIGHT HEMI-COLECTOMY;  Surgeon: Franky Macho, MD;  Location: [facility];  Service: General;  Laterality: N/A;   RADIOACTIVE SEED GUIDED EXCISIONAL BREAST BIOPSY Bilateral 11/02/2016   Procedure: BILATERAL RADIOACTIVE SEED GUIDED EXCISIONAL BREAST BIOPSY LEFT BREAST 2 SEEDS RIGHT BREAST 1 SEED;  Surgeon: Emelia Loron, MD;  Location: [facility];  Service: General;  Laterality: Bilateral;   TONSILLECTOMY  Social History: Social History   Socioeconomic History   Marital status: Widowed    Spouse name: Not on file   Number of children: 2   Years of education: Not on file   Highest education level: Not on file  Occupational History   Occupation: retired    Comment: Public relations account executive division  Tobacco Use   Smoking status: Never   Smokeless tobacco: Never  Vaping Use   Vaping status: Never Used  Substance and Sexual Activity   Alcohol use: No   Drug use: No   Sexual activity: Not Currently  Other Topics Concern   Not on file  Social History Narrative   Not on file   Social Determinants of Health   Financial Resource Strain: Low Risk  (08/09/2019)   Overall Financial Resource Strain (CARDIA)    Difficulty of Paying Living Expenses: Not hard at all  Food Insecurity: No Food Insecurity (08/09/2019)    Hunger Vital Sign    Worried About Running Out of Food in the Last Year: Never true    Ran Out of Food in the Last Year: Never true  Transportation Needs: No Transportation Needs (08/09/2019)   PRAPARE - Administrator, Civil Service (Medical): No    Lack of Transportation (Non-Medical): No  Physical Activity: Insufficiently Active (08/09/2019)   Exercise Vital Sign    Days of Exercise per Week: 3 days    Minutes of Exercise per Session: 30 min  Stress: No Stress Concern Present (08/09/2019)   [employer] of Occupational Health - Occupational Stress Questionnaire    Feeling of Stress : Only a little  Social Connections: Moderately Isolated (08/09/2019)   Social Connection and Isolation Panel [NHANES]    Frequency of Communication with Friends and Family: More than three times a week    Frequency of Social Gatherings with Friends and Family: More than three times a week    Attends Religious Services: More than 4 times per year    Active Member of [employer] or Organizations: No    Attends Banker Meetings: Never    Marital Status: Widowed  Intimate Partner Violence: Not At Risk (08/09/2019)   Humiliation, Afraid, Rape, and Kick questionnaire    Fear of Current or Ex-Partner: No    Emotionally Abused: No    Physically Abused: No    Sexually Abused: No    Family History: Family History  Problem Relation Age of Onset   Cancer Mother    Breast cancer Mother 85   Colon polyps Mother    Hypertension Father    Heart attack Father 3       deceased   Arthritis Sister    Liver disease Neg Hx     Current Medications:  Current Outpatient Medications:    acetaminophen (TYLENOL) 650 MG CR tablet, Take 650-1,300 mg by mouth every 8 (eight) hours as needed for pain., Disp: , Rfl:    atorvastatin (LIPITOR) 20 MG tablet, Take 20 mg by mouth at bedtime., Disp: , Rfl:    diphenhydrAMINE (BENADRYL) 25 MG tablet, Take 50 mg by mouth daily as needed for allergies., Disp:  , Rfl:    hydrochlorothiazide (HYDRODIURIL) 25 MG tablet, Take 25 mg by mouth daily., Disp: , Rfl:    levothyroxine (SYNTHROID) 50 MCG tablet, Take 50 mcg by mouth daily., Disp: , Rfl:    LORazepam (ATIVAN) 1 MG tablet, Take 1 mg by mouth daily as needed for anxiety., Disp: , Rfl:    losartan (  COZAAR) 100 MG tablet, Take 100 mg by mouth daily., Disp: , Rfl:    Melatonin 5 MG CAPS, Take 5 mg by mouth at bedtime as needed (sleep)., Disp: , Rfl:    PAXLOVID, 300/100, 20 x 150 MG & 10 x 100MG  TBPK, Take by mouth., Disp: , Rfl:    potassium chloride SA (KLOR-CON M) 20 MEQ tablet, Take 20 mEq by mouth daily., Disp: , Rfl:    prochlorperazine (COMPAZINE) 10 MG tablet, Take 1 tablet (10 mg total) by mouth every 6 (six) hours as needed for nausea or vomiting., Disp: 30 tablet, Rfl: 0   traMADol (ULTRAM) 50 MG tablet, Take 50 mg by mouth every 6 (six) hours as needed for moderate pain., Disp: , Rfl:    zolpidem (AMBIEN) 10 MG tablet, Take 5 mg by mouth at bedtime as needed for sleep., Disp: , Rfl:    Allergies: No Known Allergies  REVIEW OF SYSTEMS:   Review of Systems  Constitutional:  Negative for chills, fatigue and fever.  HENT:   Negative for lump/mass, mouth sores, nosebleeds, sore throat and trouble swallowing.   Eyes:  Negative for eye problems.  Respiratory:  Positive for shortness of breath. Negative for cough.   Cardiovascular:  Negative for chest pain, leg swelling and palpitations.  Gastrointestinal:  Positive for diarrhea and vomiting. Negative for abdominal pain, constipation and nausea.  Genitourinary:  Negative for bladder incontinence, difficulty urinating, dysuria, frequency, hematuria and nocturia.   Musculoskeletal:  Negative for arthralgias, back pain, flank pain, myalgias and neck pain.  Skin:  Negative for itching and rash.  Neurological:  Negative for dizziness, headaches and numbness.  Hematological:  Does not bruise/bleed easily.  Psychiatric/Behavioral:  Positive for  sleep disturbance. Negative for depression and suicidal ideas. The patient is not nervous/anxious.   All other systems reviewed and are negative.    VITALS:   Blood pressure 133/82, pulse 86, temperature 97.9 F (36.6 C), temperature source Oral, resp. rate 16, weight 175 lb 3.2 oz (79.5 kg), SpO2 94%.  Wt Readings from Last 3 Encounters:  02/22/23 175 lb 3.2 oz (79.5 kg)  12/21/22 175 lb 12.8 oz (79.7 kg)  06/22/22 175 lb 1.6 oz (79.4 kg)    Body mass index is 32.04 kg/m.  Performance status (ECOG): 1 - Symptomatic but completely ambulatory  PHYSICAL EXAM:   Physical Exam Vitals and nursing note reviewed. Exam conducted with a chaperone present.  Constitutional:      Appearance: Normal appearance.  Cardiovascular:     Rate and Rhythm: Normal rate and regular rhythm.     Pulses: Normal pulses.     Heart sounds: Normal heart sounds.  Pulmonary:     Effort: Pulmonary effort is normal.     Breath sounds: Normal breath sounds.  Abdominal:     Palpations: Abdomen is soft. There is no hepatomegaly, splenomegaly or mass.     Tenderness: There is no abdominal tenderness.  Musculoskeletal:     Right lower leg: No edema.     Left lower leg: No edema.  Lymphadenopathy:     Cervical: No cervical adenopathy.     Right cervical: No superficial, deep or posterior cervical adenopathy.    Left cervical: No superficial, deep or posterior cervical adenopathy.     Upper Body:     Right upper body: No supraclavicular or axillary adenopathy.     Left upper body: No supraclavicular or axillary adenopathy.  Neurological:     General: No focal deficit present.  Mental Status: She is alert and oriented to person, place, and time.  Psychiatric:        Mood and Affect: Mood normal.        Behavior: Behavior normal.     LABS:      Latest Ref Rng & Units 12/21/2022    1:08 PM 06/15/2022    9:08 AM 12/08/2021   11:08 AM  CBC  WBC 4.0 - 10.5 K/uL 10.9  9.5  7.3   Hemoglobin 12.0 - 15.0  g/dL 16.1  09.6  04.5   Hematocrit 36.0 - 46.0 % 40.4  42.4  40.4   Platelets 150 - 400 K/uL 277  229  253       Latest Ref Rng & Units 12/21/2022    1:08 PM 06/22/2022    2:25 PM 06/15/2022    9:08 AM  CMP  Glucose 70 - 99 mg/dL 409   91   BUN 8 - 23 mg/dL 9   10   Creatinine 8.11 - 1.00 mg/dL 9.14   7.82   Sodium 956 - 145 mmol/L 137   138   Potassium 3.5 - 5.1 mmol/L 3.2  3.3  2.8   Chloride 98 - 111 mmol/L 107   102   CO2 22 - 32 mmol/L 22   25   Calcium 8.9 - 10.3 mg/dL 8.4   9.0   Total Protein 6.5 - 8.1 g/dL 6.6   7.0   Total Bilirubin 0.3 - 1.2 mg/dL 1.2   0.8   Alkaline Phos 38 - 126 U/L 61   61   AST 15 - 41 U/L 18   22   ALT 0 - 44 U/L 20   17      Lab Results  Component Value Date   CEA1 1.9 07/21/2019   /  CEA  Date Value Ref Range Status  07/21/2019 1.9 0.0 - 4.7 ng/mL Final    Comment:    (NOTE)                             Nonsmokers          <3.9                             Smokers             <5.6 Roche [employer]ostics Electrochemiluminescence Immunoassay (ECLIA) Values obtained with different assay methods or kits cannot be used interchangeably.  Results cannot be interpreted as absolute evidence of the presence or absence of malignant disease. A duplicate report has been generated due to demographic updates. Performed At: [facility] [address] [place], [place] [id number] Jolene Schimke MD IO:[id number]    No results found for: "PSA1" No results found for: "[id number]" No results found for: "CAN125"  No results found for: "TOTALPROTELP", "ALBUMINELP", "A1GS", "A2GS", "BETS", "BETA2SER", "GAMS", "MSPIKE", "SPEI" No results found for: "TIBC", "FERRITIN", "IRONPCTSAT" No results found for: "LDH"   STUDIES:   CT ABDOMEN PELVIS W CONTRAST  Result Date: 02/22/2023 CLINICAL DATA:  Neuroendocrine tumor of the small bowel. Restaging. * Tracking Code: BO * EXAM: CT ABDOMEN AND PELVIS WITH CONTRAST TECHNIQUE: Multidetector CT imaging of the  abdomen and pelvis was performed using the standard protocol following bolus administration of intravenous contrast. RADIATION DOSE REDUCTION: This exam was performed according to the departmental dose-optimization program which includes automated exposure control, adjustment of the  mA and/or kV according to patient size and/or use of iterative reconstruction technique. CONTRAST:  OMNIPAQUE IOHEXOL 300 MG/ML  SOLN COMPARISON:  06/15/2022 FINDINGS: Lower chest: Unremarkable. Hepatobiliary: 10 mm hypervascular lesion in the anterior right liver (22/2) is stable in the interval. A second tiny subtle hypervascular focus in the posterior right liver on 25/2 is unchanged. There is no evidence for gallstones, gallbladder wall thickening, or pericholecystic fluid. No intrahepatic or extrahepatic biliary dilation. Pancreas: No focal mass lesion. No dilatation of the main duct. No intraparenchymal cyst. No peripancreatic edema. Spleen: No splenomegaly. No suspicious focal mass lesion. Adrenals/Urinary Tract: No adrenal nodule or mass. Tiny subcapsular cyst lower pole left kidney is unchanged. No followup imaging is recommended. Tiny lower pole cyst left kidney also stable. No followup imaging is recommended. No evidence for hydroureter. The urinary bladder appears normal for the degree of distention. Stomach/Bowel: Stomach is unremarkable. No gastric wall thickening. No evidence of outlet obstruction. Duodenum is normally positioned as is the ligament of Treitz. Duodenal diverticulum noted. No small bowel wall thickening. No small bowel dilatation. Right hemicolectomy. No gross colonic mass. No colonic wall thickening. Diverticular changes are noted in the left colon without evidence of diverticulitis. Vascular/Lymphatic: No abdominal aortic aneurysm. No abdominal aortic atherosclerotic calcification. There is no gastrohepatic or hepatoduodenal ligament lymphadenopathy. No retroperitoneal or mesenteric lymphadenopathy.  No pelvic sidewall lymphadenopathy. Reproductive: Hysterectomy.  There is no adnexal mass. Other: No intraperitoneal free fluid. Musculoskeletal: Paraumbilical hernia contains a short segment of transverse colon without complicating features. No worrisome lytic or sclerotic osseous abnormality. IMPRESSION: 1. Stable exam. No new or progressive findings to suggest recurrent or metastatic disease. 2. Stable tiny hypervascular liver lesions. Continued attention on follow-up recommended. 3. Paraumbilical hernia contains a short segment of transverse colon without complicating features. Electronically Signed   By: Kennith Center M.D.   On: 02/22/2023 08:25

## 2023-02-22 ENCOUNTER — Inpatient Hospital Stay: Payer: Medicare Other

## 2023-02-22 ENCOUNTER — Inpatient Hospital Stay: Payer: Medicare Other | Attending: Hematology | Admitting: Hematology

## 2023-02-22 VITALS — BP 133/82 | HR 86 | Temp 97.9°F | Resp 16 | Wt 175.2 lb

## 2023-02-22 DIAGNOSIS — C7A012 Malignant carcinoid tumor of the ileum: Secondary | ICD-10-CM

## 2023-02-22 DIAGNOSIS — R197 Diarrhea, unspecified: Secondary | ICD-10-CM | POA: Insufficient documentation

## 2023-02-22 NOTE — Patient Instructions (Addendum)
[facility] at [facility] Discharge Instructions   You were seen and examined today by Dr. Ellin Saba.  He reviewed the results of your lab work from July which are normal/stable.   He reviewed the results of your CT scan which was normal/stable.   We will check lab work today.   We will get a PET scan prior to your next visit.   Return as scheduled.    Thank you for choosing [facility] at [facility] to provide your oncology and hematology care.  To afford each patient quality time with our provider, please arrive at least 15 minutes before your scheduled appointment time.   If you have a lab appointment with the Cancer Center please come in thru the Main Entrance and check in at the main information desk.  You need to re-schedule your appointment should you arrive 10 or more minutes late.  We strive to give you quality time with our providers, and arriving late affects you and other patients whose appointments are after yours.  Also, if you no show three or more times for appointments you may be dismissed from the clinic at the providers discretion.     Again, thank you for choosing [facility].  Our hope is that these requests will decrease the amount of time that you wait before being seen by our physicians.       _____________________________________________________________  Should you have questions after your visit to [facility], please contact our office at [phone] and follow the prompts.  Our office hours are 8:00 a.m. and 4:30 p.m. Monday - Friday.  Please note that voicemails left after 4:00 p.m. may not be returned until the following business day.  We are closed weekends and major holidays.  You do have access to a nurse 24-7, just call the main number to the clinic [phone] and do not press any options, hold on the line and a nurse will answer the phone.    For prescription refill requests,  have your pharmacy contact our office and allow 72 hours.    Due to Covid, you will need to wear a mask upon entering the hospital. If you do not have a mask, a mask will be given to you at the Main Entrance upon arrival. For doctor visits, patients may have 1 support person age 31 or older with them. For treatment visits, patients can not have anyone with them due to social distancing guidelines and our immunocompromised population.

## 2023-02-24 ENCOUNTER — Other Ambulatory Visit (HOSPITAL_COMMUNITY)
Admission: RE | Admit: 2023-02-24 | Discharge: 2023-02-24 | Disposition: A | Discharge status: Home / Self Care | Payer: Medicare Other | Source: Ambulatory Visit | Attending: Hematology | Admitting: Hematology

## 2023-02-24 DIAGNOSIS — C7A012 Malignant carcinoid tumor of the ileum: Secondary | ICD-10-CM | POA: Insufficient documentation

## 2023-02-24 LAB — CHROMOGRANIN A: Chromogranin A (ng/mL): 92.6 ng/mL (ref 0.0–101.8)

## 2023-03-01 LAB — 5 HIAA, QUANTITATIVE, URINE, 24 HOUR
5-HIAA, Ur: 2.6 mg/L
5-HIAA,Quant.,24 Hr Urine: 2.6 mg/(24.h) (ref 0.0–14.9)
Total Volume: 1000

## 2023-03-04 ENCOUNTER — Encounter (HOSPITAL_COMMUNITY)
Admission: RE | Admit: 2023-03-04 | Discharge: 2023-03-04 | Disposition: A | Discharge status: Home / Self Care | Payer: Medicare Other | Source: Ambulatory Visit | Attending: Hematology | Admitting: Hematology

## 2023-03-04 DIAGNOSIS — C7A00 Malignant carcinoid tumor of unspecified site: Secondary | ICD-10-CM | POA: Diagnosis not present

## 2023-03-04 DIAGNOSIS — C7A012 Malignant carcinoid tumor of the ileum: Secondary | ICD-10-CM | POA: Diagnosis not present

## 2023-03-04 MED ORDER — COPPER CU 64 DOTATATE 1 MCI/ML IV SOLN
4.0000 | Freq: Once | INTRAVENOUS | Status: AC
  Administered 2023-03-04: 3.93 via INTRAVENOUS

## 2023-03-08 DIAGNOSIS — Z23 Encounter for immunization: Secondary | ICD-10-CM | POA: Diagnosis not present

## 2023-03-10 NOTE — Progress Notes (Signed)
Hailey Randolph [address] [place] [address]    Clinic Day:  03/11/2023  Referring physician: Carylon Perches, MD  Patient Care Team: Hailey Perches, MD as PCP - General (Internal Medicine) Hailey Sidle, MD as PCP - Cardiology (Cardiology) Hailey Gauss Gerrit Friends, MD (Gastroenterology) Hailey Bail, RN as Oncology Nurse Navigator (Oncology) Hailey Massed, MD as Medical Oncologist (Oncology)   ASSESSMENT & PLAN:   Assessment: 1.  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 -Exploratory laparotomy and resection of small bowel on 07/24/2019. -Pathology with grade 1 well-differentiated neuroendocrine tumor, 2 cm, unifocal, mitotic index 1/10 hpf, Ki-67 1%, tumor invades into the serosal surface, margins negative, no LVI, 2 tumor deposits measuring 0.4 cm and 0.5 cm, 0/18 lymph nodes positive, PT4PN0. -24-hour urine for 5-HIAA was negative.  Serum chromogranin was 54.7. -PET scan on 09/04/2019 showed single focus of radiotracer avid nodularity along the serosal surface of the cecum measuring 8 mm consistent with well-differentiated neuroendocrine tumor metastasis.  No evidence of residual tumor at the anastomosis.  No evidence of liver mets.  No evidence of distal disease. -Right hemicolectomy on 10/27/2019 which showed well-differentiated neuroendocrine tumor, 1 cm involving the wall of the cecum, grade 1.  0/25 lymph nodes involved. - Colonoscopy (11/04/2021): Anastomotic site is within normal limits.   2.  Left breast usual ductal hyperplasia: -Lumpectomy of the left breast on 11/02/2016 showing UDH, fibrocystic changes.  No evidence of malignancy. -Family history of breast cancer in the mother at age 67.    Plan: 1.  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 - We discussed 24-hour urine which showed normal 5-HIAA.  Chromogranin has returned to normal at 92.6. - Dotatate PET scan on 03/04/2023: No sign of recurrent neuroendocrine tumor. - She has occasional diarrhea for which she will continue Imodium. - RTC 6 months for follow-up with repeat chromogranin, 24-hour urine 5-HIAA, and routine labs.   2.  Left breast usual ductal hyperplasia: - Mammogram on 10/22/2022, BI-RADS Category 1.   3.  Hypokalemia: - Continue K-Dur 20 milligrams daily.    Orders Placed This Encounter  Procedures   CBC with Differential    Standing Status:   Future    Standing Expiration Date:   03/10/2024   Comprehensive metabolic panel    Standing Status:   Future    Standing Expiration Date:   03/10/2024   Chromogranin A    Standing Status:   Future    Standing Expiration Date:   03/10/2024   5 HIAA, quantitative, urine, 24 hour    Standing Status:   Future    Standing Expiration Date:   03/10/2024      Hailey Randolph,acting as a scribe for Hailey Massed, MD.,have documented all relevant documentation on the behalf of Hailey Massed, MD,as directed by  Hailey Massed, MD while in the presence of Hailey Massed, MD.  I, Hailey Massed MD, have reviewed the above documentation for accuracy and completeness, and I agree with the above.    Hailey Massed, MD   10/17/20245:43 PM  CHIEF COMPLAINT:   Diagnosis: Malignant carcinoid tumor of ileum    Cancer Staging  Carcinoid tumor of small intestine Staging form: Small Intestine - Other Histologies, AJCC 8th Edition - Clinical stage from 08/10/2019: cT4, cN0, cM0 - Unsigned  Prior Therapy: 1. Small bowel resection on 07/24/2019. 2. Right hemi-colectomy on 10/27/2019.  Current Therapy:  surveillance    HISTORY OF PRESENT ILLNESS:   Oncology History   No history exists.     INTERVAL HISTORY:   Hailey Randolph is a 72 y.o. female presenting to  clinic today for follow up of Malignant carcinoid tumor of ileum. She was last seen by me on 02/22/23.  Since her last visit, she underwent PET dotatate on 03/04/23 that found: no signs of tracer avid residual or recurrent neuroendocrine tumor; postop change from right hemicolectomy; and ventral midline abdominal wall hernia contains nonobstructed loops of bowel.  Today, she states that she is doing well overall. Her appetite level is at 100%. Her energy level is at 75%. She has occasional diarrhea that is stable. Imodium effectively treats this.   PAST MEDICAL HISTORY:   Past Medical History: Past Medical History:  Diagnosis Date   Adenomatous colon polyp    Carcinoid tumor of small intestine    Essential hypertension    Hyperlipidemia    Hypothyroidism    Insomnia    Osteoarthritis     Surgical History: Past Surgical History:  Procedure Laterality Date   ABDOMINAL HYSTERECTOMY     APPENDECTOMY     BIOPSY  08/14/2016   Procedure: BIOPSY;  Surgeon: Corbin Ade, MD;  [facility];  Service: Endoscopy;;  gastric   BOWEL RESECTION N/A 07/24/2019   Procedure: PARTIAL SMALL BOWEL RESECTION;  Surgeon: Franky Macho, MD;  Location: [facility];  Service: General;  Laterality: N/A;   BREAST EXCISIONAL BIOPSY Left 10/30/2016   BREAST EXCISIONAL BIOPSY Right 10/30/2016   COLON SURGERY     COLONOSCOPY  08/2005   Adenomatous polyp, pedunculated, at 30 cm. Scattered left-sided diverticula   COLONOSCOPY N/A 01/17/2016   Dr. Jena Gauss: 5 mm polyp in the cecum, tubular adenoma. Scattered small and large mouth diverticula in the sigmoid colon. Next colonoscopy 5 years.   COLONOSCOPY WITH PROPOFOL N/A 11/04/2021   Procedure: COLONOSCOPY WITH PROPOFOL;  Surgeon: Franky Macho, MD;  [facility];  Service: Gastroenterology;  Laterality: N/A;   ESOPHAGOGASTRODUODENOSCOPY N/A 08/14/2016   Procedure: ESOPHAGOGASTRODUODENOSCOPY (EGD);  Surgeon: Corbin Ade, MD;  [facility];  Service: Endoscopy;  Laterality: N/A;  215   hysterectomy     complete   LAPAROTOMY N/A 07/24/2019   Procedure: EXPLORATORY LAPAROTOMY;  Surgeon: Franky Macho, MD;  Location: [facility];  Service: General;  Laterality: N/A;   PARTIAL COLECTOMY N/A 10/27/2019   Procedure: RIGHT HEMI-COLECTOMY;  Surgeon: Franky Macho, MD;  Location: [facility];  Service: General;  Laterality: N/A;   RADIOACTIVE SEED GUIDED EXCISIONAL BREAST BIOPSY Bilateral 11/02/2016   Procedure: BILATERAL RADIOACTIVE SEED GUIDED EXCISIONAL BREAST BIOPSY LEFT BREAST 2 SEEDS RIGHT BREAST 1 SEED;  Surgeon: Emelia Loron, MD;  Location: [facility];  Service: General;  Laterality: Bilateral;   TONSILLECTOMY      Social History: Social History   Socioeconomic History   Marital status: Widowed    Spouse name: Not on file   Number of children: 2   Years of education: Not on file   Highest education level: Not on file  Occupational History   Occupation: retired    Comment: Public relations account executive division  Tobacco Use   Smoking status: Never   Smokeless tobacco: Never  Vaping Use   Vaping status: Never Used  Substance and Sexual Activity   Alcohol use: No   Drug use:  No   Sexual activity: Not Currently  Other Topics Concern   Not on file  Social History Narrative   Not on file   Social Determinants of Health   Financial Resource Strain: Low Risk  (08/09/2019)   Overall Financial Resource Strain (CARDIA)    Difficulty of Paying Living Expenses: Not hard at all  Food Insecurity: No Food Insecurity (08/09/2019)   Hunger Vital Sign    Worried About Running Out of Food in the Last Year: Never true    Ran Out of Food in the Last Year: Never true  Transportation Needs: No Transportation Needs (08/09/2019)   PRAPARE - Administrator, Civil Service (Medical): No    Lack of Transportation (Non-Medical): No  Physical Activity: Insufficiently Active (08/09/2019)   Exercise Vital Sign    Days of  Exercise per Week: 3 days    Minutes of Exercise per Session: 30 min  Stress: No Stress Concern Present (08/09/2019)   [employer] of Occupational Health - Occupational Stress Questionnaire    Feeling of Stress : Only a little  Social Connections: Moderately Isolated (08/09/2019)   Social Connection and Isolation Panel [NHANES]    Frequency of Communication with Randolph and Family: More than three times a week    Frequency of Social Gatherings with Randolph and Family: More than three times a week    Attends Religious Services: More than 4 times per year    Active Member of [employer] or Organizations: No    Attends Banker Meetings: Never    Marital Status: Widowed  Intimate Partner Violence: Not At Risk (08/09/2019)   Humiliation, Afraid, Rape, and Kick questionnaire    Fear of Current or Ex-Partner: No    Emotionally Abused: No    Physically Abused: No    Sexually Abused: No    Family History: Family History  Problem Relation Age of Onset   Cancer Mother    Breast cancer Mother 73   Colon polyps Mother    Hypertension Father    Heart attack Father 84       deceased   Arthritis Sister    Liver disease Neg Hx     Current Medications:  Current Outpatient Medications:    acetaminophen (TYLENOL) 650 MG CR tablet, Take 650-1,300 mg by mouth every 8 (eight) hours as needed for pain., Disp: , Rfl:    atorvastatin (LIPITOR) 20 MG tablet, Take 20 mg by mouth at bedtime., Disp: , Rfl:    hydrochlorothiazide (HYDRODIURIL) 25 MG tablet, Take 25 mg by mouth daily., Disp: , Rfl:    levothyroxine (SYNTHROID) 50 MCG tablet, Take 50 mcg by mouth daily., Disp: , Rfl:    LORazepam (ATIVAN) 1 MG tablet, Take 1 mg by mouth daily as needed for anxiety., Disp: , Rfl:    losartan (COZAAR) 100 MG tablet, Take 100 mg by mouth daily., Disp: , Rfl:    Melatonin 5 MG CAPS, Take 5 mg by mouth at bedtime as needed (sleep)., Disp: , Rfl:    PAXLOVID, 300/100, 20 x 150 MG & 10 x 100MG  TBPK,  Take by mouth., Disp: , Rfl:    potassium chloride SA (KLOR-CON M) 20 MEQ tablet, Take 20 mEq by mouth daily., Disp: , Rfl:    prochlorperazine (COMPAZINE) 10 MG tablet, Take 1 tablet (10 mg total) by mouth every 6 (six) hours as needed for nausea or vomiting., Disp: 30 tablet, Rfl: 0   traMADol (ULTRAM) 50 MG tablet, Take 50 mg  by mouth every 6 (six) hours as needed for moderate pain., Disp: , Rfl:    zolpidem (AMBIEN) 10 MG tablet, Take 5 mg by mouth at bedtime as needed for sleep., Disp: , Rfl:    Allergies: No Known Allergies  REVIEW OF SYSTEMS:   Review of Systems  Constitutional:  Negative for chills, fatigue and fever.  HENT:   Negative for lump/mass, mouth sores, nosebleeds, sore throat and trouble swallowing.   Eyes:  Negative for eye problems.  Respiratory:  Negative for cough and shortness of breath.   Cardiovascular:  Negative for chest pain, leg swelling and palpitations.  Gastrointestinal:  Positive for diarrhea. Negative for abdominal pain, constipation, nausea and vomiting.  Genitourinary:  Negative for bladder incontinence, difficulty urinating, dysuria, frequency, hematuria and nocturia.   Musculoskeletal:  Negative for arthralgias, back pain, flank pain, myalgias and neck pain.  Skin:  Negative for itching and rash.  Neurological:  Negative for dizziness, headaches and numbness.  Hematological:  Does not bruise/bleed easily.  Psychiatric/Behavioral:  Negative for depression, sleep disturbance and suicidal ideas. The patient is not nervous/anxious.   All other systems reviewed and are negative.    VITALS:   Blood pressure 118/77, pulse 62, temperature 97.7 F (36.5 C), temperature source Oral, resp. rate 18, weight 178 lb 12.8 oz (81.1 kg), SpO2 98%.  Wt Readings from Last 3 Encounters:  03/11/23 178 lb 12.8 oz (81.1 kg)  02/22/23 175 lb 3.2 oz (79.5 kg)  12/21/22 175 lb 12.8 oz (79.7 kg)    Body mass index is 32.7 kg/m.  Performance status (ECOG): 1 -  Symptomatic but completely ambulatory  PHYSICAL EXAM:   Physical Exam Vitals and nursing note reviewed. Exam conducted with a chaperone present.  Constitutional:      Appearance: Normal appearance.  Cardiovascular:     Rate and Rhythm: Normal rate and regular rhythm.     Pulses: Normal pulses.     Heart sounds: Normal heart sounds.  Pulmonary:     Effort: Pulmonary effort is normal.     Breath sounds: Normal breath sounds.  Abdominal:     Palpations: Abdomen is soft. There is no hepatomegaly, splenomegaly or mass.     Tenderness: There is no abdominal tenderness.  Musculoskeletal:     Right lower leg: No edema.     Left lower leg: No edema.  Lymphadenopathy:     Cervical: No cervical adenopathy.     Right cervical: No superficial, deep or posterior cervical adenopathy.    Left cervical: No superficial, deep or posterior cervical adenopathy.     Upper Body:     Right upper body: No supraclavicular or axillary adenopathy.     Left upper body: No supraclavicular or axillary adenopathy.  Neurological:     General: No focal deficit present.     Mental Status: She is alert and oriented to person, place, and time.  Psychiatric:        Mood and Affect: Mood normal.        Behavior: Behavior normal.     LABS:      Latest Ref Rng & Units 12/21/2022    1:08 PM 06/15/2022    9:08 AM 12/08/2021   11:08 AM  CBC  WBC 4.0 - 10.5 K/uL 10.9  9.5  7.3   Hemoglobin 12.0 - 15.0 g/dL 16.1  09.6  04.5   Hematocrit 36.0 - 46.0 % 40.4  42.4  40.4   Platelets 150 - 400 K/uL 277  229  253       Latest Ref Rng & Units 12/21/2022    1:08 PM 06/22/2022    2:25 PM 06/15/2022    9:08 AM  CMP  Glucose 70 - 99 mg/dL 409   91   BUN 8 - 23 mg/dL 9   10   Creatinine 8.11 - 1.00 mg/dL 9.14   7.82   Sodium 956 - 145 mmol/L 137   138   Potassium 3.5 - 5.1 mmol/L 3.2  3.3  2.8   Chloride 98 - 111 mmol/L 107   102   CO2 22 - 32 mmol/L 22   25   Calcium 8.9 - 10.3 mg/dL 8.4   9.0   Total Protein 6.5 -  8.1 g/dL 6.6   7.0   Total Bilirubin 0.3 - 1.2 mg/dL 1.2   0.8   Alkaline Phos 38 - 126 U/L 61   61   AST 15 - 41 U/L 18   22   ALT 0 - 44 U/L 20   17      Lab Results  Component Value Date   CEA1 1.9 07/21/2019   /  CEA  Date Value Ref Range Status  07/21/2019 1.9 0.0 - 4.7 ng/mL Final    Comment:    (NOTE)                             Nonsmokers          <3.9                             Smokers             <5.6 Roche [employer]ostics Electrochemiluminescence Immunoassay (ECLIA) Values obtained with different assay methods or kits cannot be used interchangeably.  Results cannot be interpreted as absolute evidence of the presence or absence of malignant disease. A duplicate report has been generated due to demographic updates. Performed At: [facility] [address] [place], [place] [id number] Jolene Schimke MD IO:[id number]    No results found for: "PSA1" No results found for: "[id number]" No results found for: "CAN125"  No results found for: "TOTALPROTELP", "ALBUMINELP", "A1GS", "A2GS", "BETS", "BETA2SER", "GAMS", "MSPIKE", "SPEI" No results found for: "TIBC", "FERRITIN", "IRONPCTSAT" No results found for: "LDH"   STUDIES:   NM PET DOTATATE SKULL BASE TO MID THIGH  Result Date: 03/04/2023 CLINICAL DATA:  Restaging neuroendocrine tumor. History of small bowel carcinoid status post partial small-bowel resection and right hemicolectomy. EXAM: NUCLEAR MEDICINE PET SKULL BASE TO THIGH TECHNIQUE: 3.93 mCi Cu 64 DOTATATE was injected intravenously. Full-ring PET imaging was performed from the skull base to thigh after the radiotracer. CT data was obtained and used for attenuation correction and anatomic localization. COMPARISON:  None Available. FINDINGS: NECK No radiotracer activity in neck lymph nodes. Incidental CT findings: None CHEST No radiotracer accumulation within mediastinal or hilar lymph nodes. No suspicious pulmonary nodules on the CT scan. Incidental CT  finding:None ABDOMEN/PELVIS There is no abnormal radiotracer uptake within the liver. No tracer avid mesenteric mass. Postop change from right hemicolectomy. No abnormal bowel activity identified. Physiologic activity noted in the liver, spleen, adrenal glands and kidneys. Incidental CT findings:Ventral midline abdominal wall hernia contains nonobstructed loops of bowel. SKELETON No focal activity to suggest skeletal metastasis. Incidental CT findings:None IMPRESSION: 1. No signs of tracer avid residual or recurrent neuroendocrine tumor. 2. Postop change from right hemicolectomy.  3. Ventral midline abdominal wall hernia contains nonobstructed loops of bowel. Electronically Signed   By: Signa Kell M.D.   On: 03/04/2023 15:28   CT ABDOMEN PELVIS W CONTRAST  Result Date: 02/22/2023 CLINICAL DATA:  Neuroendocrine tumor of the small bowel. Restaging. * Tracking Code: BO * EXAM: CT ABDOMEN AND PELVIS WITH CONTRAST TECHNIQUE: Multidetector CT imaging of the abdomen and pelvis was performed using the standard protocol following bolus administration of intravenous contrast. RADIATION DOSE REDUCTION: This exam was performed according to the departmental dose-optimization program which includes automated exposure control, adjustment of the mA and/or kV according to patient size and/or use of iterative reconstruction technique. CONTRAST:  OMNIPAQUE IOHEXOL 300 MG/ML  SOLN COMPARISON:  06/15/2022 FINDINGS: Lower chest: Unremarkable. Hepatobiliary: 10 mm hypervascular lesion in the anterior right liver (22/2) is stable in the interval. A second tiny subtle hypervascular focus in the posterior right liver on 25/2 is unchanged. There is no evidence for gallstones, gallbladder wall thickening, or pericholecystic fluid. No intrahepatic or extrahepatic biliary dilation. Pancreas: No focal mass lesion. No dilatation of the main duct. No intraparenchymal cyst. No peripancreatic edema. Spleen: No splenomegaly. No suspicious  focal mass lesion. Adrenals/Urinary Tract: No adrenal nodule or mass. Tiny subcapsular cyst lower pole left kidney is unchanged. No followup imaging is recommended. Tiny lower pole cyst left kidney also stable. No followup imaging is recommended. No evidence for hydroureter. The urinary bladder appears normal for the degree of distention. Stomach/Bowel: Stomach is unremarkable. No gastric wall thickening. No evidence of outlet obstruction. Duodenum is normally positioned as is the ligament of Treitz. Duodenal diverticulum noted. No small bowel wall thickening. No small bowel dilatation. Right hemicolectomy. No gross colonic mass. No colonic wall thickening. Diverticular changes are noted in the left colon without evidence of diverticulitis. Vascular/Lymphatic: No abdominal aortic aneurysm. No abdominal aortic atherosclerotic calcification. There is no gastrohepatic or hepatoduodenal ligament lymphadenopathy. No retroperitoneal or mesenteric lymphadenopathy. No pelvic sidewall lymphadenopathy. Reproductive: Hysterectomy.  There is no adnexal mass. Other: No intraperitoneal free fluid. Musculoskeletal: Paraumbilical hernia contains a short segment of transverse colon without complicating features. No worrisome lytic or sclerotic osseous abnormality. IMPRESSION: 1. Stable exam. No new or progressive findings to suggest recurrent or metastatic disease. 2. Stable tiny hypervascular liver lesions. Continued attention on follow-up recommended. 3. Paraumbilical hernia contains a short segment of transverse colon without complicating features. Electronically Signed   By: Kennith Center M.D.   On: 02/22/2023 08:25

## 2023-03-11 ENCOUNTER — Inpatient Hospital Stay: Payer: Medicare Other | Attending: Hematology | Admitting: Hematology

## 2023-03-11 VITALS — BP 118/77 | HR 62 | Temp 97.7°F | Resp 18 | Wt 178.8 lb

## 2023-03-11 DIAGNOSIS — E876 Hypokalemia: Secondary | ICD-10-CM | POA: Diagnosis not present

## 2023-03-11 DIAGNOSIS — R197 Diarrhea, unspecified: Secondary | ICD-10-CM | POA: Insufficient documentation

## 2023-03-11 DIAGNOSIS — C7A012 Malignant carcinoid tumor of the ileum: Secondary | ICD-10-CM | POA: Diagnosis not present

## 2023-03-11 NOTE — Patient Instructions (Addendum)
[facility]  Discharge Instructions  You were seen and examined today by Dr. Ellin Saba.  Dr. Ellin Saba discussed your most recent lab work and CT scan which is stable.  Follow-up as scheduled.  Thank you for choosing [facility] to provide your oncology and hematology care.   To afford each patient quality time with our provider, please arrive at least 15 minutes before your scheduled appointment time. You may need to reschedule your appointment if you arrive late (10 or more minutes). Arriving late affects you and other patients whose appointments are after yours.  Also, if you miss three or more appointments without notifying the office, you may be dismissed from the clinic at the provider's discretion.    Again, thank you for choosing [facility].  Our hope is that these requests will decrease the amount of time that you wait before being seen by our physicians.   If you have a lab appointment with the Cancer Center - please note that after April 8th, all labs will be drawn in the cancer center.  You do not have to check in or register with the main entrance as you have in the past but will complete your check-in at the cancer center.            _____________________________________________________________  Should you have questions after your visit to [facility], please contact our office at [phone] and follow the prompts.  Our office hours are 8:00 a.m. to 4:30 p.m. Monday - Thursday and 8:00 a.m. to 2:30 p.m. Friday.  Please note that voicemails left after 4:00 p.m. may not be returned until the following business day.  We are closed weekends and all major holidays.  You do have access to a nurse 24-7, just call the main number to the clinic [phone] and do not press any options, hold on the line and a nurse will answer the phone.    For prescription refill requests, have your pharmacy contact  our office and allow 72 hours.    Masks are no longer required in the cancer centers. If you would like for your care team to wear a mask while they are taking care of you, please let them know. You may have one support person who is at least 73 years old accompany you for your appointments.

## 2023-04-26 DIAGNOSIS — E039 Hypothyroidism, unspecified: Secondary | ICD-10-CM | POA: Diagnosis not present

## 2023-06-14 ENCOUNTER — Other Ambulatory Visit: Payer: Medicare Other

## 2023-06-14 ENCOUNTER — Other Ambulatory Visit (HOSPITAL_COMMUNITY): Payer: Medicare Other

## 2023-06-21 ENCOUNTER — Ambulatory Visit: Payer: Medicare Other | Admitting: Hematology

## 2023-07-26 DIAGNOSIS — G4700 Insomnia, unspecified: Secondary | ICD-10-CM | POA: Diagnosis not present

## 2023-07-26 DIAGNOSIS — F419 Anxiety disorder, unspecified: Secondary | ICD-10-CM | POA: Diagnosis not present

## 2023-07-26 DIAGNOSIS — E876 Hypokalemia: Secondary | ICD-10-CM | POA: Diagnosis not present

## 2023-07-26 DIAGNOSIS — M1990 Unspecified osteoarthritis, unspecified site: Secondary | ICD-10-CM | POA: Diagnosis not present

## 2023-07-26 DIAGNOSIS — I10 Essential (primary) hypertension: Secondary | ICD-10-CM | POA: Diagnosis not present

## 2023-07-26 DIAGNOSIS — E039 Hypothyroidism, unspecified: Secondary | ICD-10-CM | POA: Diagnosis not present

## 2023-07-26 DIAGNOSIS — Z79899 Other long term (current) drug therapy: Secondary | ICD-10-CM | POA: Diagnosis not present

## 2023-07-26 DIAGNOSIS — C7A019 Malignant carcinoid tumor of the small intestine, unspecified portion: Secondary | ICD-10-CM | POA: Diagnosis not present

## 2023-08-02 DIAGNOSIS — Z0000 Encounter for general adult medical examination without abnormal findings: Secondary | ICD-10-CM | POA: Diagnosis not present

## 2023-08-02 DIAGNOSIS — Z79899 Other long term (current) drug therapy: Secondary | ICD-10-CM | POA: Diagnosis not present

## 2023-08-02 DIAGNOSIS — E785 Hyperlipidemia, unspecified: Secondary | ICD-10-CM | POA: Diagnosis not present

## 2023-08-02 DIAGNOSIS — E039 Hypothyroidism, unspecified: Secondary | ICD-10-CM | POA: Diagnosis not present

## 2023-08-02 DIAGNOSIS — C7A019 Malignant carcinoid tumor of the small intestine, unspecified portion: Secondary | ICD-10-CM | POA: Diagnosis not present

## 2023-08-02 DIAGNOSIS — R710 Precipitous drop in hematocrit: Secondary | ICD-10-CM | POA: Diagnosis not present

## 2023-08-02 DIAGNOSIS — I10 Essential (primary) hypertension: Secondary | ICD-10-CM | POA: Diagnosis not present

## 2023-08-10 DIAGNOSIS — D519 Vitamin B12 deficiency anemia, unspecified: Secondary | ICD-10-CM | POA: Diagnosis not present

## 2023-08-17 DIAGNOSIS — D519 Vitamin B12 deficiency anemia, unspecified: Secondary | ICD-10-CM | POA: Diagnosis not present

## 2023-08-24 DIAGNOSIS — D519 Vitamin B12 deficiency anemia, unspecified: Secondary | ICD-10-CM | POA: Diagnosis not present

## 2023-08-31 DIAGNOSIS — D519 Vitamin B12 deficiency anemia, unspecified: Secondary | ICD-10-CM | POA: Diagnosis not present

## 2023-09-02 ENCOUNTER — Inpatient Hospital Stay: Payer: Medicare Other | Attending: Hematology

## 2023-09-02 DIAGNOSIS — Z85060 Personal history of malignant carcinoid tumor of small intestine: Secondary | ICD-10-CM | POA: Diagnosis not present

## 2023-09-02 DIAGNOSIS — E876 Hypokalemia: Secondary | ICD-10-CM | POA: Diagnosis not present

## 2023-09-02 DIAGNOSIS — Z08 Encounter for follow-up examination after completed treatment for malignant neoplasm: Secondary | ICD-10-CM | POA: Diagnosis not present

## 2023-09-02 DIAGNOSIS — C7A012 Malignant carcinoid tumor of the ileum: Secondary | ICD-10-CM

## 2023-09-02 LAB — CBC WITH DIFFERENTIAL/PLATELET
Abs Immature Granulocytes: 0.03 10*3/uL (ref 0.00–0.07)
Basophils Absolute: 0.1 10*3/uL (ref 0.0–0.1)
Basophils Relative: 1 %
Eosinophils Absolute: 0.2 10*3/uL (ref 0.0–0.5)
Eosinophils Relative: 2 %
HCT: 45.1 % (ref 36.0–46.0)
Hemoglobin: 15 g/dL (ref 12.0–15.0)
Immature Granulocytes: 0 %
Lymphocytes Relative: 34 %
Lymphs Abs: 3.1 10*3/uL (ref 0.7–4.0)
MCH: 35 pg — ABNORMAL HIGH (ref 26.0–34.0)
MCHC: 33.3 g/dL (ref 30.0–36.0)
MCV: 105.4 fL — ABNORMAL HIGH (ref 80.0–100.0)
Monocytes Absolute: 0.7 10*3/uL (ref 0.1–1.0)
Monocytes Relative: 7 %
Neutro Abs: 5.1 10*3/uL (ref 1.7–7.7)
Neutrophils Relative %: 56 %
Platelets: 228 10*3/uL (ref 150–400)
RBC: 4.28 MIL/uL (ref 3.87–5.11)
RDW: 11.9 % (ref 11.5–15.5)
WBC: 9.2 10*3/uL (ref 4.0–10.5)
nRBC: 0 % (ref 0.0–0.2)

## 2023-09-02 LAB — COMPREHENSIVE METABOLIC PANEL WITH GFR
ALT: 23 U/L (ref 0–44)
AST: 31 U/L (ref 15–41)
Albumin: 3.7 g/dL (ref 3.5–5.0)
Alkaline Phosphatase: 66 U/L (ref 38–126)
Anion gap: 14 (ref 5–15)
BUN: 11 mg/dL (ref 8–23)
CO2: 22 mmol/L (ref 22–32)
Calcium: 9 mg/dL (ref 8.9–10.3)
Chloride: 103 mmol/L (ref 98–111)
Creatinine, Ser: 0.97 mg/dL (ref 0.44–1.00)
GFR, Estimated: >60 mL/min (ref >60)
Glucose, Bld: 93 mg/dL (ref 70–99)
Potassium: 3.4 mmol/L — ABNORMAL LOW (ref 3.5–5.1)
Sodium: 139 mmol/L (ref 135–145)
Total Bilirubin: 0.9 mg/dL (ref 0.0–1.2)
Total Protein: 6.9 g/dL (ref 6.5–8.1)

## 2023-09-06 DIAGNOSIS — Z85060 Personal history of malignant carcinoid tumor of small intestine: Secondary | ICD-10-CM | POA: Diagnosis not present

## 2023-09-06 DIAGNOSIS — Z08 Encounter for follow-up examination after completed treatment for malignant neoplasm: Secondary | ICD-10-CM | POA: Diagnosis not present

## 2023-09-06 DIAGNOSIS — E876 Hypokalemia: Secondary | ICD-10-CM | POA: Diagnosis not present

## 2023-09-06 LAB — CHROMOGRANIN A: Chromogranin A (ng/mL): 74.3 ng/mL (ref 0.0–101.8)

## 2023-09-07 DIAGNOSIS — D519 Vitamin B12 deficiency anemia, unspecified: Secondary | ICD-10-CM | POA: Diagnosis not present

## 2023-09-08 ENCOUNTER — Other Ambulatory Visit (HOSPITAL_COMMUNITY): Payer: Self-pay | Admitting: Internal Medicine

## 2023-09-08 DIAGNOSIS — Z1231 Encounter for screening mammogram for malignant neoplasm of breast: Secondary | ICD-10-CM

## 2023-09-09 ENCOUNTER — Inpatient Hospital Stay (HOSPITAL_BASED_OUTPATIENT_CLINIC_OR_DEPARTMENT_OTHER): Payer: Medicare Other | Admitting: Hematology

## 2023-09-09 VITALS — BP 132/76 | HR 86 | Temp 97.5°F | Resp 18 | Ht 60.0 in | Wt 175.0 lb

## 2023-09-09 DIAGNOSIS — C7A012 Malignant carcinoid tumor of the ileum: Secondary | ICD-10-CM | POA: Diagnosis not present

## 2023-09-09 DIAGNOSIS — Z08 Encounter for follow-up examination after completed treatment for malignant neoplasm: Secondary | ICD-10-CM | POA: Diagnosis not present

## 2023-09-09 DIAGNOSIS — E876 Hypokalemia: Secondary | ICD-10-CM | POA: Diagnosis not present

## 2023-09-09 DIAGNOSIS — Z85060 Personal history of malignant carcinoid tumor of small intestine: Secondary | ICD-10-CM | POA: Diagnosis not present

## 2023-09-09 LAB — 5 HIAA, QUANTITATIVE, URINE, 24 HOUR
5-HIAA, Ur: 2.6 mg/L
5-HIAA,Quant.,24 Hr Urine: 2.5 mg/(24.h) (ref 0.0–14.9)
Total Volume: 950

## 2023-09-09 NOTE — Patient Instructions (Addendum)
 [facility] at [facility] Discharge Instructions   You were seen and examined today by Dr. Ellin Saba.  He reviewed the results of your lab work which are normal/stable.   We will see you back in 6 months. We will repeat a CT scan and lab work prior to this visit.    Return as scheduled.    Thank you for choosing [facility] to provide your oncology and hematology care.  To afford each patient quality time with our provider, please arrive at least 15 minutes before your scheduled appointment time.   If you have a lab appointment with the Cancer Center please come in thru the Main Entrance and check in at the main information desk.  You need to re-schedule your appointment should you arrive 10 or more minutes late.  We strive to give you quality time with our providers, and arriving late affects you and other patients whose appointments are after yours.  Also, if you no show three or more times for appointments you may be dismissed from the clinic at the providers discretion.     Again, thank you for choosing [facility].  Our hope is that these requests will decrease the amount of time that you wait before being seen by our physicians.       _____________________________________________________________  Should you have questions after your visit to [facility], please contact our office at [phone] and follow the prompts.  Our office hours are 8:00 a.m. and 4:30 p.m. Monday - Friday.  Please note that voicemails left after 4:00 p.m. may not be returned until the following business day.  We are closed weekends and major holidays.  You do have access to a nurse 24-7, just call the main number to the clinic [phone] and do not press any options, hold on the line and a nurse will answer the phone.    For prescription refill requests, have your pharmacy contact our office and allow 72 hours.    Due to  Covid, you will need to wear a mask upon entering the hospital. If you do not have a mask, a mask will be given to you at the Main Entrance upon arrival. For doctor visits, patients may have 1 support person age 64 or older with them. For treatment visits, patients can not have anyone with them due to social distancing guidelines and our immunocompromised population.

## 2023-09-09 NOTE — Progress Notes (Signed)
 [facility] [address], [place] [address]    Clinic Day:  09/09/2023  Referring physician: Carylon Perches, MD  Patient Care Team: Carylon Perches, MD as PCP - General (Internal Medicine) Jonelle Sidle, MD as PCP - Cardiology (Cardiology) Jena Gauss Gerrit Friends, MD (Gastroenterology) Mickie Bail, RN as Oncology Nurse Navigator (Oncology) Doreatha Massed, MD as Medical Oncologist (Oncology)   ASSESSMENT & PLAN:   Assessment: 1.  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 -Exploratory laparotomy and resection of small bowel on 07/24/2019. -Pathology with grade 1 well-differentiated neuroendocrine tumor, 2 cm, unifocal, mitotic index 1/10 hpf, Ki-67 1%, tumor invades into the serosal surface, margins negative, no LVI, 2 tumor deposits measuring 0.4 cm and 0.5 cm, 0/18 lymph nodes positive, PT4PN0. -24-hour urine for 5-HIAA was negative.  Serum chromogranin was 54.7. -PET scan on 09/04/2019 showed single focus of radiotracer avid nodularity along the serosal surface of the cecum measuring 8 mm consistent with well-differentiated neuroendocrine tumor metastasis.  No evidence of residual tumor at the anastomosis.  No evidence of liver mets.  No evidence of distal disease. -Right hemicolectomy on 10/27/2019 which showed well-differentiated neuroendocrine tumor, 1 cm involving the wall of the cecum, grade 1.  0/25 lymph nodes involved. - Colonoscopy (11/04/2021): Anastomotic site is within normal limits.   2.  Left breast usual ductal hyperplasia: -Lumpectomy of the left breast on 11/02/2016 showing UDH, fibrocystic changes.  No evidence of malignancy. -Family history of breast cancer in the mother at age 43.    Plan: 1.  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 - She  has occasional diarrhea which is stable since surgery. - Reviewed labs from 09/02/2023: Normal LFTs.  CBC shows macrocytosis with normal hemoglobin.  She was reportedly found to have low B12 levels at 52 by Dr. Ouida Sills and was started on B12 injections 6 weeks ago. - Chromogranin level is 74.  24-hour urine 5-HIAA is pending. - Recommend continued follow-up in 6 months with repeat labs, 24-hour urine, CT CAP with contrast.   2.  Left breast usual ductal hyperplasia: - Last mammogram in May 2024 was BI-RADS Category 1.  Next mammogram is scheduled on 10/25/2023.   3.  Hypokalemia: - Continue K-Dur 20 milliequivalents daily.  Potassium is 3.4.    Orders Placed This Encounter  Procedures   CT ABDOMEN PELVIS W CONTRAST    Standing Status:   Future    Expected Date:   03/10/2024    Expiration Date:   09/08/2024    If indicated for the ordered procedure, I authorize the administration of contrast media per Radiology protocol:   Yes    Does the patient have a contrast media/X-ray dye allergy?:   No    Preferred imaging location?:   [facility]    If indicated for the ordered procedure, I authorize the administration of oral contrast media per Radiology protocol:   Yes   CBC with Differential    Standing Status:   Future    Expected Date:   03/06/2024    Expiration Date:   09/08/2024   Comprehensive metabolic panel    Standing Status:   Future    Expected Date:   03/06/2024    Expiration Date:   09/08/2024   Chromogranin A    Standing Status:   Future  Expected Date:   03/06/2024    Expiration Date:   09/08/2024   5 HIAA, quantitative, urine, 24 hour    Standing Status:   Future    Expected Date:   03/06/2024    Expiration Date:   09/08/2024   Magnesium    Standing Status:   Future    Expected Date:   03/06/2024    Expiration Date:   09/08/2024      Mikeal Hawthorne R Teague,acting as a scribe for Doreatha Massed, MD.,have documented all relevant documentation on the behalf of  Doreatha Massed, MD,as directed by  Doreatha Massed, MD while in the presence of Doreatha Massed, MD.  I, Doreatha Massed MD, have reviewed the above documentation for accuracy and completeness, and I agree with the above.      Doreatha Massed, MD   4/17/202512:56 PM  CHIEF COMPLAINT:   Diagnosis: Malignant carcinoid tumor of ileum    Cancer Staging  Carcinoid tumor of small intestine Staging form: Small Intestine - Other Histologies, AJCC 8th Edition - Clinical stage from 08/10/2019: cT4, cN0, cM0 - Unsigned    Prior Therapy: 1. Small bowel resection on 07/24/2019. 2. Right hemi-colectomy on 10/27/2019.  Current Therapy:  surveillance    HISTORY OF PRESENT ILLNESS:   Oncology History   No history exists.     INTERVAL HISTORY:   Hailey Randolph is a 73 y.o. female presenting to clinic today for follow up of Malignant carcinoid tumor of ileum. She was last seen by me on 03/11/23.  Today, she states that she is doing well overall. Her appetite level is at 100%. Her energy level is at 75%. Her diarrhea is stable and usually occurs once in the morning, occasionally 2-3 times a day. She denies any flushing or wheezing. [place] started weekly B12 injections 6 weeks ago, transitioned to monthly injections, for elevated MCV and low Vitamin B12 levels.   She has a mammogram scheduled for 10/25/23. Rei is taking potasium as prescribed.   PAST MEDICAL HISTORY:   Past Medical History: Past Medical History:  Diagnosis Date   Adenomatous colon polyp    Carcinoid tumor of small intestine    Essential hypertension    Hyperlipidemia    Hypothyroidism    Insomnia    Osteoarthritis     Surgical History: Past Surgical History:  Procedure Laterality Date   ABDOMINAL HYSTERECTOMY     APPENDECTOMY     BIOPSY  08/14/2016   Procedure: BIOPSY;  Surgeon: Corbin Ade, MD;  [facility]: [facility];  Service: Endoscopy;;  gastric   BOWEL RESECTION N/A 07/24/2019    Procedure: PARTIAL SMALL BOWEL RESECTION;  Surgeon: Franky Macho, MD;  Location: [facility];  Service: General;  Laterality: N/A;   BREAST EXCISIONAL BIOPSY Left 10/30/2016   BREAST EXCISIONAL BIOPSY Right 10/30/2016   COLON SURGERY     COLONOSCOPY  08/2005   Adenomatous polyp, pedunculated, at 30 cm. Scattered left-sided diverticula   COLONOSCOPY N/A 01/17/2016   Dr. Jena Gauss: 5 mm polyp in the cecum, tubular adenoma. Scattered small and large mouth diverticula in the sigmoid colon. Next colonoscopy 5 years.   COLONOSCOPY WITH PROPOFOL N/A 11/04/2021   Procedure: COLONOSCOPY WITH PROPOFOL;  Surgeon: Franky Macho, MD;  Location: [facility];  Service: Gastroenterology;  Laterality: N/A;   ESOPHAGOGASTRODUODENOSCOPY N/A 08/14/2016   Procedure: ESOPHAGOGASTRODUODENOSCOPY (EGD);  Surgeon: Corbin Ade, MD;  Location: [facility];  Service: Endoscopy;  Laterality: N/A;  215   hysterectomy     complete  LAPAROTOMY N/A 07/24/2019   Procedure: EXPLORATORY LAPAROTOMY;  Surgeon: Franky Macho, MD;  Location: [facility];  Service: General;  Laterality: N/A;   PARTIAL COLECTOMY N/A 10/27/2019   Procedure: RIGHT HEMI-COLECTOMY;  Surgeon: Franky Macho, MD;  Location: [facility];  Service: General;  Laterality: N/A;   RADIOACTIVE SEED GUIDED EXCISIONAL BREAST BIOPSY Bilateral 11/02/2016   Procedure: BILATERAL RADIOACTIVE SEED GUIDED EXCISIONAL BREAST BIOPSY LEFT BREAST 2 SEEDS RIGHT BREAST 1 SEED;  Surgeon: Emelia Loron, MD;  Location: [facility];  Service: General;  Laterality: Bilateral;   TONSILLECTOMY      Social History: Social History   Socioeconomic History   Marital status: Widowed    Spouse name: Not on file   Number of children: 2   Years of education: Not on file   Highest education level: Not on file  Occupational History   Occupation: retired    Comment: Public relations account executive division  Tobacco Use   Smoking status: Never   Smokeless tobacco: Never  Vaping Use   Vaping  status: Never Used  Substance and Sexual Activity   Alcohol use: No   Drug use: No   Sexual activity: Not Currently  Other Topics Concern   Not on file  Social History Narrative   Not on file   Social Drivers of Health   Financial Resource Strain: Low Risk  (08/09/2019)   Overall Financial Resource Strain (CARDIA)    Difficulty of Paying Living Expenses: Not hard at all  Food Insecurity: No Food Insecurity (08/09/2019)   Hunger Vital Sign    Worried About Running Out of Food in the Last Year: Never true    Ran Out of Food in the Last Year: Never true  Transportation Needs: No Transportation Needs (08/09/2019)   PRAPARE - Administrator, Civil Service (Medical): No    Lack of Transportation (Non-Medical): No  Physical Activity: Insufficiently Active (08/09/2019)   Exercise Vital Sign    Days of Exercise per Week: 3 days    Minutes of Exercise per Session: 30 min  Stress: No Stress Concern Present (08/09/2019)   [employer] of Occupational Health - Occupational Stress Questionnaire    Feeling of Stress : Only a little  Social Connections: Moderately Isolated (08/09/2019)   Social Connection and Isolation Panel [NHANES]    Frequency of Communication with Friends and Family: More than three times a week    Frequency of Social Gatherings with Friends and Family: More than three times a week    Attends Religious Services: More than 4 times per year    Active Member of [employer] or Organizations: No    Attends Banker Meetings: Never    Marital Status: Widowed  Intimate Partner Violence: Not At Risk (08/09/2019)   Humiliation, Afraid, Rape, and Kick questionnaire    Fear of Current or Ex-Partner: No    Emotionally Abused: No    Physically Abused: No    Sexually Abused: No    Family History: Family History  Problem Relation Age of Onset   Cancer Mother    Breast cancer Mother 9   Colon polyps Mother    Hypertension Father    Heart attack Father 7        deceased   Arthritis Sister    Liver disease Neg Hx     Current Medications:  Current Outpatient Medications:    acetaminophen (TYLENOL) 650 MG CR tablet, Take 650-1,300 mg by mouth every 8 (eight) hours as needed for  pain., Disp: , Rfl:    atorvastatin (LIPITOR) 20 MG tablet, Take 20 mg by mouth at bedtime., Disp: , Rfl:    cyanocobalamin (VITAMIN B12) 1000 MCG/ML injection, , Disp: , Rfl:    hydrochlorothiazide (HYDRODIURIL) 25 MG tablet, Take 25 mg by mouth daily., Disp: , Rfl:    levothyroxine (SYNTHROID) 75 MCG tablet, Take 75 mcg by mouth daily., Disp: , Rfl:    LORazepam (ATIVAN) 1 MG tablet, Take 1 mg by mouth daily as needed for anxiety., Disp: , Rfl:    losartan (COZAAR) 100 MG tablet, Take 100 mg by mouth daily., Disp: , Rfl:    Melatonin 5 MG CAPS, Take 5 mg by mouth at bedtime as needed (sleep)., Disp: , Rfl:    PAXLOVID, 300/100, 20 x 150 MG & 10 x 100MG  TBPK, Take by mouth., Disp: , Rfl:    potassium chloride SA (KLOR-CON M) 20 MEQ tablet, Take 20 mEq by mouth daily., Disp: , Rfl:    prochlorperazine (COMPAZINE) 10 MG tablet, Take 1 tablet (10 mg total) by mouth every 6 (six) hours as needed for nausea or vomiting., Disp: 30 tablet, Rfl: 0   traMADol (ULTRAM) 50 MG tablet, Take 50 mg by mouth every 6 (six) hours as needed for moderate pain., Disp: , Rfl:    zolpidem (AMBIEN) 10 MG tablet, Take 5 mg by mouth at bedtime as needed for sleep., Disp: , Rfl:    Allergies: No Known Allergies  REVIEW OF SYSTEMS:   Review of Systems  Constitutional:  Negative for chills, fatigue and fever.  HENT:   Negative for lump/mass, mouth sores, nosebleeds, sore throat and trouble swallowing.   Eyes:  Negative for eye problems.  Respiratory:  Positive for shortness of breath. Negative for cough.   Cardiovascular:  Negative for chest pain, leg swelling and palpitations.  Gastrointestinal:  Positive for diarrhea. Negative for abdominal pain, constipation, nausea and vomiting.   Genitourinary:  Negative for bladder incontinence, difficulty urinating, dysuria, frequency, hematuria and nocturia.   Musculoskeletal:  Negative for arthralgias, back pain, flank pain, myalgias and neck pain.  Skin:  Negative for itching and rash.  Neurological:  Negative for dizziness, headaches and numbness.  Hematological:  Does not bruise/bleed easily.  Psychiatric/Behavioral:  Positive for sleep disturbance. Negative for depression and suicidal ideas. The patient is not nervous/anxious.   All other systems reviewed and are negative.    VITALS:   Blood pressure 132/76, pulse 86, temperature (!) 97.5 F (36.4 C), temperature source Tympanic, resp. rate 18, height 5' (1.524 m), weight 175 lb (79.4 kg), SpO2 99%.  Wt Readings from Last 3 Encounters:  09/09/23 175 lb (79.4 kg)  03/11/23 178 lb 12.8 oz (81.1 kg)  02/22/23 175 lb 3.2 oz (79.5 kg)    Body mass index is 34.18 kg/m.  Performance status (ECOG): 1 - Symptomatic but completely ambulatory  PHYSICAL EXAM:   Physical Exam Vitals and nursing note reviewed. Exam conducted with a chaperone present.  Constitutional:      Appearance: Normal appearance.  Cardiovascular:     Rate and Rhythm: Normal rate and regular rhythm.     Pulses: Normal pulses.     Heart sounds: Normal heart sounds.  Pulmonary:     Effort: Pulmonary effort is normal.     Breath sounds: Normal breath sounds.  Abdominal:     Palpations: Abdomen is soft. There is no hepatomegaly, splenomegaly or mass.     Tenderness: There is no abdominal tenderness.  Musculoskeletal:  Right lower leg: No edema.     Left lower leg: No edema.  Lymphadenopathy:     Cervical: No cervical adenopathy.     Right cervical: No superficial, deep or posterior cervical adenopathy.    Left cervical: No superficial, deep or posterior cervical adenopathy.     Upper Body:     Right upper body: No supraclavicular or axillary adenopathy.     Left upper body: No supraclavicular  or axillary adenopathy.  Neurological:     General: No focal deficit present.     Mental Status: She is alert and oriented to person, place, and time.  Psychiatric:        Mood and Affect: Mood normal.        Behavior: Behavior normal.     LABS:      Latest Ref Rng & Units 09/02/2023    2:31 PM 12/21/2022    1:08 PM 06/15/2022    9:08 AM  CBC  WBC 4.0 - 10.5 K/uL 9.2  10.9  9.5   Hemoglobin 12.0 - 15.0 g/dL 14.7  82.9  56.2   Hematocrit 36.0 - 46.0 % 45.1  40.4  42.4   Platelets 150 - 400 K/uL 228  277  229       Latest Ref Rng & Units 09/02/2023    2:31 PM 12/21/2022    1:08 PM 06/22/2022    2:25 PM  CMP  Glucose 70 - 99 mg/dL 93  130    BUN 8 - 23 mg/dL 11  9    Creatinine 8.65 - 1.00 mg/dL 7.84  6.96    Sodium 295 - 145 mmol/L 139  137    Potassium 3.5 - 5.1 mmol/L 3.4  3.2  3.3   Chloride 98 - 111 mmol/L 103  107    CO2 22 - 32 mmol/L 22  22    Calcium 8.9 - 10.3 mg/dL 9.0  8.4    Total Protein 6.5 - 8.1 g/dL 6.9  6.6    Total Bilirubin 0.0 - 1.2 mg/dL 0.9  1.2    Alkaline Phos 38 - 126 U/L 66  61    AST 15 - 41 U/L 31  18    ALT 0 - 44 U/L 23  20       Lab Results  Component Value Date   CEA1 1.9 07/21/2019   /  CEA  Date Value Ref Range Status  07/21/2019 1.9 0.0 - 4.7 ng/mL Final    Comment:    (NOTE)                             Nonsmokers          <3.9                             Smokers             <5.6 Roche [employer] Electrochemiluminescence Immunoassay (ECLIA) Values obtained with different assay methods or kits cannot be used interchangeably.  Results cannot be interpreted as absolute evidence of the presence or absence of malignant disease. A duplicate report has been generated due to demographic updates. Performed At: [facility] [address] [place], [place] [id number] Jolene Schimke MD NU:[id number]    No results found for: "PSA1" No results found for: "CAN199" No results found for: "CAN125"  No results found for:  "TOTALPROTELP", "ALBUMINELP", "A1GS", "A2GS", "  BETS", "BETA2SER", "GAMS", "MSPIKE", "SPEI" No results found for: "TIBC", "FERRITIN", "IRONPCTSAT" No results found for: "LDH"   STUDIES:   No results found.

## 2023-10-25 ENCOUNTER — Ambulatory Visit (HOSPITAL_COMMUNITY)
Admission: RE | Admit: 2023-10-25 | Discharge: 2023-10-25 | Disposition: A | Discharge status: Home / Self Care | Source: Ambulatory Visit | Attending: Internal Medicine | Admitting: Internal Medicine

## 2023-10-25 DIAGNOSIS — Z1231 Encounter for screening mammogram for malignant neoplasm of breast: Secondary | ICD-10-CM | POA: Diagnosis not present

## 2023-11-03 DIAGNOSIS — D7589 Other specified diseases of blood and blood-forming organs: Secondary | ICD-10-CM | POA: Diagnosis not present

## 2024-02-02 DIAGNOSIS — D7589 Other specified diseases of blood and blood-forming organs: Secondary | ICD-10-CM | POA: Diagnosis not present

## 2024-02-14 DIAGNOSIS — Z23 Encounter for immunization: Secondary | ICD-10-CM | POA: Diagnosis not present

## 2024-02-14 DIAGNOSIS — I10 Essential (primary) hypertension: Secondary | ICD-10-CM | POA: Diagnosis not present

## 2024-02-14 DIAGNOSIS — D510 Vitamin B12 deficiency anemia due to intrinsic factor deficiency: Secondary | ICD-10-CM | POA: Diagnosis not present

## 2024-03-10 ENCOUNTER — Ambulatory Visit (HOSPITAL_COMMUNITY)
Admission: RE | Admit: 2024-03-10 | Discharge: 2024-03-10 | Disposition: A | Discharge status: Home / Self Care | Source: Ambulatory Visit | Attending: Hematology | Admitting: Hematology

## 2024-03-10 ENCOUNTER — Inpatient Hospital Stay: Attending: Oncology

## 2024-03-10 DIAGNOSIS — Z08 Encounter for follow-up examination after completed treatment for malignant neoplasm: Secondary | ICD-10-CM | POA: Insufficient documentation

## 2024-03-10 DIAGNOSIS — R0602 Shortness of breath: Secondary | ICD-10-CM | POA: Diagnosis not present

## 2024-03-10 DIAGNOSIS — K432 Incisional hernia without obstruction or gangrene: Secondary | ICD-10-CM | POA: Diagnosis not present

## 2024-03-10 DIAGNOSIS — Z85060 Personal history of malignant carcinoid tumor of small intestine: Secondary | ICD-10-CM | POA: Diagnosis not present

## 2024-03-10 DIAGNOSIS — C7A012 Malignant carcinoid tumor of the ileum: Secondary | ICD-10-CM | POA: Diagnosis not present

## 2024-03-10 DIAGNOSIS — R197 Diarrhea, unspecified: Secondary | ICD-10-CM | POA: Diagnosis not present

## 2024-03-10 DIAGNOSIS — Z90710 Acquired absence of both cervix and uterus: Secondary | ICD-10-CM | POA: Diagnosis not present

## 2024-03-10 DIAGNOSIS — Z853 Personal history of malignant neoplasm of breast: Secondary | ICD-10-CM | POA: Diagnosis not present

## 2024-03-10 LAB — CBC WITH DIFFERENTIAL/PLATELET
Abs Immature Granulocytes: 0.03 K/uL (ref 0.00–0.07)
Basophils Absolute: 0.1 K/uL (ref 0.0–0.1)
Basophils Relative: 1 %
Eosinophils Absolute: 0.2 K/uL (ref 0.0–0.5)
Eosinophils Relative: 2 %
HCT: 42.6 % (ref 36.0–46.0)
Hemoglobin: 14.7 g/dL (ref 12.0–15.0)
Immature Granulocytes: 0 %
Lymphocytes Relative: 26 %
Lymphs Abs: 2.5 K/uL (ref 0.7–4.0)
MCH: 32.7 pg (ref 26.0–34.0)
MCHC: 34.5 g/dL (ref 30.0–36.0)
MCV: 94.9 fL (ref 80.0–100.0)
Monocytes Absolute: 0.8 K/uL (ref 0.1–1.0)
Monocytes Relative: 8 %
Neutro Abs: 6 K/uL (ref 1.7–7.7)
Neutrophils Relative %: 63 %
Platelets: 376 K/uL (ref 150–400)
RBC: 4.49 MIL/uL (ref 3.87–5.11)
RDW: 12.5 % (ref 11.5–15.5)
WBC: 9.5 K/uL (ref 4.0–10.5)
nRBC: 0 % (ref 0.0–0.2)

## 2024-03-10 LAB — COMPREHENSIVE METABOLIC PANEL WITH GFR
ALT: 15 U/L (ref 0–44)
AST: 24 U/L (ref 15–41)
Albumin: 4 g/dL (ref 3.5–5.0)
Alkaline Phosphatase: 80 U/L (ref 38–126)
Anion gap: 16 — ABNORMAL HIGH (ref 5–15)
BUN: 9 mg/dL (ref 8–23)
CO2: 20 mmol/L — ABNORMAL LOW (ref 22–32)
Calcium: 9.2 mg/dL (ref 8.9–10.3)
Chloride: 104 mmol/L (ref 98–111)
Creatinine, Ser: 0.95 mg/dL (ref 0.44–1.00)
GFR, Estimated: >60 mL/min (ref >60)
Glucose, Bld: 116 mg/dL — ABNORMAL HIGH (ref 70–99)
Potassium: 3.1 mmol/L — ABNORMAL LOW (ref 3.5–5.1)
Sodium: 140 mmol/L (ref 135–145)
Total Bilirubin: 0.7 mg/dL (ref 0.0–1.2)
Total Protein: 6.9 g/dL (ref 6.5–8.1)

## 2024-03-10 LAB — MAGNESIUM: Magnesium: 1.8 mg/dL (ref 1.7–2.4)

## 2024-03-10 MED ORDER — IOHEXOL 9 MG/ML PO SOLN
ORAL | Status: AC
  Filled 2024-03-10: qty 1000

## 2024-03-10 MED ADMIN — Iohexol Inj 300 MG/ML: 100 mL | INTRAVENOUS | NDC 00407141363

## 2024-03-13 LAB — CHROMOGRANIN A: Chromogranin A (ng/mL): 78.5 ng/mL (ref 0.0–101.8)

## 2024-03-14 LAB — 5 HIAA, QUANTITATIVE, URINE, 24 HOUR
5-HIAA, Ur: 3.2 mg/L
5-HIAA,Quant.,24 Hr Urine: 2.2 mg/(24.h) (ref 0.0–14.9)
Total Volume: 700

## 2024-03-16 ENCOUNTER — Ambulatory Visit: Admitting: Hematology

## 2024-03-17 ENCOUNTER — Inpatient Hospital Stay: Admitting: Oncology

## 2024-03-22 ENCOUNTER — Encounter: Payer: Self-pay | Admitting: Oncology

## 2024-03-22 ENCOUNTER — Inpatient Hospital Stay: Admitting: Oncology

## 2024-03-22 VITALS — BP 144/76 | HR 81 | Temp 98.8°F | Resp 18 | Ht 60.0 in | Wt 174.0 lb

## 2024-03-22 DIAGNOSIS — C7A019 Malignant carcinoid tumor of the small intestine, unspecified portion: Secondary | ICD-10-CM

## 2024-03-22 DIAGNOSIS — Z853 Personal history of malignant neoplasm of breast: Secondary | ICD-10-CM | POA: Diagnosis not present

## 2024-03-22 DIAGNOSIS — Z08 Encounter for follow-up examination after completed treatment for malignant neoplasm: Secondary | ICD-10-CM | POA: Diagnosis not present

## 2024-03-22 DIAGNOSIS — R197 Diarrhea, unspecified: Secondary | ICD-10-CM | POA: Diagnosis not present

## 2024-03-22 DIAGNOSIS — D0512 Intraductal carcinoma in situ of left breast: Secondary | ICD-10-CM | POA: Diagnosis not present

## 2024-03-22 DIAGNOSIS — Z85060 Personal history of malignant carcinoid tumor of small intestine: Secondary | ICD-10-CM | POA: Diagnosis not present

## 2024-03-22 DIAGNOSIS — R0602 Shortness of breath: Secondary | ICD-10-CM | POA: Diagnosis not present

## 2024-03-22 NOTE — Assessment & Plan Note (Addendum)
-   No sign of recurrence. - She has occasional diarrhea which is stable since surgery. - Reviewed labs from 03/10/2024: Normal LFTs.  CBC shows resolution of macrocytosis along with a normal hemoglobin.  Magnesium  1.8.  Chromogranin A 78.5.  24-hour urine 5-HIAA is WNL.   - Recommend continued follow-up in 6 months with repeat labs, 24-hour urine.  Repeat imaging annually.

## 2024-03-22 NOTE — Progress Notes (Signed)
 [facility] OFFICE PROGRESS NOTE  Sheryle Carwin, MD  ASSESSMENT & PLAN:  Assessment & Plan Malignant carcinoid tumor of small intestine, unspecified location [facility]) - No sign of recurrence. - She has occasional diarrhea which is stable since surgery. - Reviewed labs from 03/10/2024: Normal LFTs.  CBC shows resolution of macrocytosis along with a normal hemoglobin.  Magnesium  1.8.  Chromogranin A 78.5.  24-hour urine 5-HIAA is WNL.   - Recommend continued follow-up in 6 months with repeat labs, 24-hour urine.  Repeat imaging annually. Ductal carcinoma in situ (DCIS) of left breast - Last mammogram in June 2025 was BI-RADS Category 1.  Next mammogram is scheduled on June 2026.  Dr. Sheryle will order.  Orders Placed This Encounter  Procedures   CBC with Differential    Standing Status:   Future    Expected Date:   09/21/2024    Expiration Date:   03/22/2025   Comprehensive metabolic panel    Standing Status:   Future    Expected Date:   09/21/2024    Expiration Date:   03/22/2025   Chromogranin A    Standing Status:   Future    Expected Date:   09/21/2024    Expiration Date:   03/22/2025   Magnesium     Standing Status:   Future    Expected Date:   09/21/2024    Expiration Date:   03/22/2025   5 HIAA, quantitative, urine, 24 hour    Standing Status:   Future    Expected Date:   09/21/2024    Expiration Date:   03/22/2025    INTERVAL HISTORY: Patient returns for follow-up for neuroendocrine tumor of small bowel.  Reports appetite 100% energy levels of 50%.  Denies any pain.  Reports worsening exertional shortness of breath and occasional diarrhea.  She is a non-smoker and has never smoked.  Denies any palpitations or back issues.  Reports she has started to be more active and wondering if it is just because she was so out of shape.  She is using her stationary bike several times per week.  She is currently still giving herself monthly B12 shots.  We reviewed CBC, CMP,  chromogranin A, magnesium  and 5 HIAA urine.  SUMMARY OF HEMATOLOGIC HISTORY: Oncology History Overview Note  1.  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 -Exploratory laparotomy and resection of small bowel on 07/24/2019. -Pathology with grade 1 well-differentiated neuroendocrine tumor, 2 cm, unifocal, mitotic index 1/10 hpf, Ki-67 1%, tumor invades into the serosal surface, margins negative, no LVI, 2 tumor deposits measuring 0.4 cm and 0.5 cm, 0/18 lymph nodes positive, PT4PN0. -24-hour urine for 5-HIAA was negative.  Serum chromogranin was 54.7. -PET scan on 09/04/2019 showed single focus of radiotracer avid nodularity along the serosal surface of the cecum measuring 8 mm consistent with well-differentiated neuroendocrine tumor metastasis.  No evidence of residual tumor at the anastomosis.  No evidence of liver mets.  No evidence of distal disease. -Right hemicolectomy on 10/27/2019 which showed well-differentiated neuroendocrine tumor, 1 cm involving the wall of the cecum, grade 1.  0/25 lymph nodes involved. - Colonoscopy (11/04/2021): Anastomotic site is within normal limits.   2.  Left breast usual ductal hyperplasia: -Lumpectomy of the left breast on 11/02/2016 showing UDH, fibrocystic changes.  No evidence of malignancy. -Family history of breast cancer in the mother at age 81.  No history exists.     CBC    Component Value Date/Time   WBC 9.5 03/10/2024 1036   RBC 4.49 03/10/2024 1036   HGB 14.7 03/10/2024 1036   HCT 42.6 03/10/2024 1036   PLT 376 03/10/2024 1036   MCV 94.9 03/10/2024 1036   MCH 32.7 03/10/2024 1036   MCHC 34.5 03/10/2024 1036   RDW 12.5 03/10/2024 1036   LYMPHSABS 2.5 03/10/2024 1036   MONOABS 0.8 03/10/2024 1036   EOSABS 0.2 03/10/2024 1036   BASOSABS 0.1 03/10/2024 1036       Latest Ref Rng & Units 03/10/2024   10:36 AM  09/02/2023    2:31 PM 12/21/2022    1:08 PM  CMP  Glucose 70 - 99 mg/dL 883  93  897   BUN 8 - 23 mg/dL 9  11  9    Creatinine 0.44 - 1.00 mg/dL 9.04  9.02  8.97   Sodium 135 - 145 mmol/L 140  139  137   Potassium 3.5 - 5.1 mmol/L 3.1  3.4  3.2   Chloride 98 - 111 mmol/L 104  103  107   CO2 22 - 32 mmol/L 20  22  22    Calcium  8.9 - 10.3 mg/dL 9.2  9.0  8.4   Total Protein 6.5 - 8.1 g/dL 6.9  6.9  6.6   Total Bilirubin 0.0 - 1.2 mg/dL 0.7  0.9  1.2   Alkaline Phos 38 - 126 U/L 80  66  61   AST 15 - 41 U/L 24  31  18    ALT 0 - 44 U/L 15  23  20       No results found for: FERRITIN, VITAMINB12  Vitals:   03/22/24 1012  BP: (!) 144/76  Pulse: 81  Resp: 18  Temp: 98.8 F (37.1 C)  SpO2: 99%    Review of System:  Review of Systems  Constitutional:  Positive for malaise/fatigue.  Respiratory:  Positive for shortness of breath.   Cardiovascular:  Negative for palpitations.  Gastrointestinal:  Positive for diarrhea.    Physical Exam: Physical Exam Constitutional:      Appearance: Normal appearance.  HENT:     Head: Normocephalic and atraumatic.  Eyes:     Pupils: Pupils are equal, round, and reactive to light.  Cardiovascular:     Rate and Rhythm: Normal rate and regular rhythm.     Heart sounds: Normal heart sounds. No murmur heard. Pulmonary:     Effort: Pulmonary effort is normal.     Breath sounds: Normal breath sounds. No wheezing.  Abdominal:     General: Bowel sounds are normal. There is no distension.     Palpations: Abdomen is soft.     Tenderness: There is no abdominal tenderness.  Musculoskeletal:        General: Normal range of motion.     Cervical back: Normal range of motion.  Skin:    General: Skin is warm and dry.     Findings: No rash.  Neurological:     Mental Status: She is alert and oriented to person, place, and time.     Gait: Gait is intact.  Psychiatric:        Mood and Affect: Mood and affect normal.        Cognition and Memory:  Memory normal.        Judgment: Judgment normal.      I spent 25 minutes dedicated to the care of this patient (face-to-face and  non-face-to-face) on the date of the encounter to include what is described in the assessment and plan.,  Delon Hope, NP 03/22/2024 1:10 PM

## 2024-03-22 NOTE — Assessment & Plan Note (Addendum)
-   Last mammogram in June 2025 was BI-RADS Category 1.  Next mammogram is scheduled on June 2026.  Dr. Sheryle will order.

## 2024-03-22 NOTE — Assessment & Plan Note (Signed)
-   Last mammogram in May 2024 was BI-RADS Category 1.  Next mammogram is scheduled on 10/25/2023.

## 2024-05-22 ENCOUNTER — Encounter: Payer: Self-pay | Admitting: *Deleted

## 2024-09-15 ENCOUNTER — Inpatient Hospital Stay

## 2024-09-22 ENCOUNTER — Inpatient Hospital Stay: Admitting: Oncology
